# Patient Record
Sex: Male | Born: 1945 | Race: White | Hispanic: No | Marital: Married | Smoking: Former smoker
Health system: Southern US, Community
[De-identification: ages and names within clinical notes are randomized; demographics above are authoritative.]

## PROBLEM LIST (undated history)

## (undated) DIAGNOSIS — M1990 Unspecified osteoarthritis, unspecified site: Secondary | ICD-10-CM

## (undated) DIAGNOSIS — R351 Nocturia: Secondary | ICD-10-CM

## (undated) DIAGNOSIS — R05 Cough: Secondary | ICD-10-CM

## (undated) DIAGNOSIS — K219 Gastro-esophageal reflux disease without esophagitis: Secondary | ICD-10-CM

## (undated) DIAGNOSIS — I10 Essential (primary) hypertension: Principal | ICD-10-CM

## (undated) DIAGNOSIS — R200 Anesthesia of skin: Secondary | ICD-10-CM

## (undated) DIAGNOSIS — I712 Thoracic aortic aneurysm, without rupture: Secondary | ICD-10-CM

## (undated) DIAGNOSIS — H9190 Unspecified hearing loss, unspecified ear: Secondary | ICD-10-CM

## (undated) DIAGNOSIS — E785 Hyperlipidemia, unspecified: Secondary | ICD-10-CM

## (undated) DIAGNOSIS — G4733 Obstructive sleep apnea (adult) (pediatric): Secondary | ICD-10-CM

## (undated) DIAGNOSIS — R053 Chronic cough: Secondary | ICD-10-CM

## (undated) DIAGNOSIS — Z77090 Contact with and (suspected) exposure to asbestos: Secondary | ICD-10-CM

## (undated) DIAGNOSIS — R011 Cardiac murmur, unspecified: Secondary | ICD-10-CM

## (undated) HISTORY — DX: Contact with and (suspected) exposure to asbestos: Z77.090

## (undated) HISTORY — DX: Essential (primary) hypertension: I10

## (undated) HISTORY — DX: Obstructive sleep apnea (adult) (pediatric): G47.33

## (undated) HISTORY — DX: Unspecified hearing loss, unspecified ear: H91.90

## (undated) HISTORY — DX: Thoracic aortic aneurysm, without rupture: I71.2

## (undated) HISTORY — DX: Hyperlipidemia, unspecified: E78.5

## (undated) HISTORY — PX: TONSILLECTOMY: SUR1361

---

## 1981-04-05 HISTORY — PX: HAND SURGERY: SHX662

## 1998-12-31 ENCOUNTER — Ambulatory Visit (HOSPITAL_COMMUNITY): Admission: RE | Admit: 1998-12-31 | Discharge: 1998-12-31 | Payer: Self-pay | Admitting: Gastroenterology

## 2002-10-01 ENCOUNTER — Ambulatory Visit (HOSPITAL_COMMUNITY): Admission: RE | Admit: 2002-10-01 | Discharge: 2002-10-01 | Payer: Self-pay | Admitting: Gastroenterology

## 2002-10-01 ENCOUNTER — Encounter (INDEPENDENT_AMBULATORY_CARE_PROVIDER_SITE_OTHER): Payer: Self-pay | Admitting: Specialist

## 2004-04-16 ENCOUNTER — Ambulatory Visit (HOSPITAL_COMMUNITY): Admission: RE | Admit: 2004-04-16 | Discharge: 2004-04-16 | Payer: Self-pay | Admitting: Gastroenterology

## 2004-04-16 ENCOUNTER — Encounter (INDEPENDENT_AMBULATORY_CARE_PROVIDER_SITE_OTHER): Payer: Self-pay | Admitting: Specialist

## 2007-05-31 ENCOUNTER — Other Ambulatory Visit: Payer: Self-pay | Admitting: General Surgery

## 2007-05-31 ENCOUNTER — Ambulatory Visit (HOSPITAL_COMMUNITY): Admission: RE | Admit: 2007-05-31 | Discharge: 2007-05-31 | Payer: Self-pay | Admitting: General Surgery

## 2007-05-31 ENCOUNTER — Encounter: Admission: RE | Admit: 2007-05-31 | Discharge: 2007-05-31 | Payer: Self-pay | Admitting: General Surgery

## 2007-07-12 ENCOUNTER — Ambulatory Visit (HOSPITAL_BASED_OUTPATIENT_CLINIC_OR_DEPARTMENT_OTHER): Admission: RE | Admit: 2007-07-12 | Discharge: 2007-07-12 | Payer: Self-pay | Admitting: General Surgery

## 2009-04-05 HISTORY — PX: HERNIA REPAIR: SHX51

## 2009-04-05 HISTORY — PX: TOTAL KNEE ARTHROPLASTY: SHX125

## 2009-04-05 HISTORY — PX: CATARACT EXTRACTION: SUR2

## 2009-05-13 ENCOUNTER — Inpatient Hospital Stay (HOSPITAL_COMMUNITY): Admission: RE | Admit: 2009-05-13 | Discharge: 2009-05-16 | Payer: Self-pay | Admitting: Orthopedic Surgery

## 2010-06-24 LAB — URINALYSIS, ROUTINE W REFLEX MICROSCOPIC
Protein, ur: NEGATIVE mg/dL
Urobilinogen, UA: 0.2 mg/dL (ref 0.0–1.0)
pH: 7.5 (ref 5.0–8.0)

## 2010-06-24 LAB — CBC
HCT: 31.4 % — ABNORMAL LOW (ref 39.0–52.0)
HCT: 34.7 % — ABNORMAL LOW (ref 39.0–52.0)
HCT: 40.4 % (ref 39.0–52.0)
MCHC: 34.6 g/dL (ref 30.0–36.0)
MCV: 91.8 fL (ref 78.0–100.0)
MCV: 92 fL (ref 78.0–100.0)
Platelets: 216 10*3/uL (ref 150–400)
Platelets: 278 10*3/uL (ref 150–400)
RBC: 3.74 MIL/uL — ABNORMAL LOW (ref 4.22–5.81)
RDW: 12.9 % (ref 11.5–15.5)
RDW: 13 % (ref 11.5–15.5)
RDW: 13 % (ref 11.5–15.5)
WBC: 10.6 10*3/uL — ABNORMAL HIGH (ref 4.0–10.5)

## 2010-06-24 LAB — PROTIME-INR
INR: 0.86 (ref 0.00–1.49)
INR: 1.1 (ref 0.00–1.49)
INR: 1.7 — ABNORMAL HIGH (ref 0.00–1.49)
Prothrombin Time: 11.6 seconds (ref 11.6–15.2)
Prothrombin Time: 19.8 seconds — ABNORMAL HIGH (ref 11.6–15.2)

## 2010-06-24 LAB — BASIC METABOLIC PANEL
BUN: 10 mg/dL (ref 6–23)
CO2: 27 mEq/L (ref 19–32)
CO2: 28 mEq/L (ref 19–32)
Calcium: 8.1 mg/dL — ABNORMAL LOW (ref 8.4–10.5)
Calcium: 9.1 mg/dL (ref 8.4–10.5)
Chloride: 104 mEq/L (ref 96–112)
Creatinine, Ser: 0.73 mg/dL (ref 0.4–1.5)
Creatinine, Ser: 0.85 mg/dL (ref 0.4–1.5)
GFR calc Af Amer: >60 mL/min (ref >60)
GFR calc non Af Amer: >60 mL/min (ref >60)
Glucose, Bld: 105 mg/dL — ABNORMAL HIGH (ref 70–99)
Glucose, Bld: 134 mg/dL — ABNORMAL HIGH (ref 70–99)
Sodium: 137 mEq/L (ref 135–145)

## 2010-06-24 LAB — URINE CULTURE: Colony Count: 4000

## 2010-06-24 LAB — TYPE AND SCREEN: Antibody Screen: NEGATIVE

## 2010-08-18 NOTE — Op Note (Signed)
NAME:  [name], [name] NO.:  [id number]   MEDICAL RECORD NO.:  [id number]          PATIENT TYPE:  AMB   LOCATION:  [facility]                         FACILITY:  [facility]   PHYSICIAN:  Leonie Man, M.D.   DATE OF BIRTH:  1945/05/06   DATE OF PROCEDURE:  07/12/2007  DATE OF DISCHARGE:                               OPERATIVE REPORT   PREOPERATIVE DIAGNOSIS:  Right inguinal hernia.   POSTOPERATIVE DIAGNOSIS:  Right inguinal hernia.   PROCEDURE:  Right inguinal hernia repair with mesh.   SURGEON:  Leonie Man, M.D.   ASSISTANT:  OR tech.   ANESTHESIA:  General.   SPECIMENS:  Hernia sac (disposed of).   ESTIMATED BLOOD LOSS:  Minimal.   COMPLICATIONS:  None.  The patient was returned to the PACU in excellent  condition.   INDICATION:  Justin Sutton is a 65 year old man with a very large right-  sided inguinal hernia, who comes now to the operating room for repair  after the risks and potential benefits of surgery have been fully  discussed, all questions answered and consent obtained.   PROCEDURE:  Following the induction of satisfactory general anesthesia  with the patient positioned supinely, the right groin was prepped and  draped to be included in a sterile operative field.  Positive  identification of the patient as Justin Sutton and the procedure to be  done as right inguinal hernia was carried out.   I made a transverse incision into the lower abdominal crease, deepening  this through the skin and subcutaneous tissues, carrying the dissection  down to the external oblique aponeurosis.  The external oblique  aponeurosis was opened up through the external inguinal ring with  protection of the ilioinguinal nerve, which was retracted inferiorly and  laterally.  Spermatic cord was elevated and held with a Penrose drain.  Dissection within the spermatic cord revealed a moderately large  indirect hernia sac which was dissected free from the surrounding cord  contents and carried up to the internal ring.  This hernia sac was  devoid of any intra-abdominal contents and was suture-ligated at its  base with 2-0 silk suture.  The redundant sac was amputated and  discarded.  Two relatively large cord lipomas were also dissected free  from the cord, carried up to the internal ring and then clamped and  suture-ligated at the internal ring.  The lipomas were excised and  discarded.  The floor of the inguinal canal was then repaired with a  polypropylene mesh, which was fashioned to fit into the inguinal canal  and split at this tail so as to allow the spermatic cord were come  between the leaflets of the mesh.  The polypropylene mesh was then sewn  in starting at the pubic tubercle with 2-0 Novofil.  The suture line was  carried up along the conjoined tendon to the internal ring and again  from the pubic tubercle up along the shelving edge of Poupart's ligament  to the internal ring.  The tails of the mesh were then sutured down into  the internal oblique muscle behind the cord and  the tails extended up  underneath the external oblique aponeurosis.  All areas of dissection  were then checked for hemostasis and noted to be dry.  Sponge and  instrument counts were verified.  I injected the region of the pubic  tubercle and subcutaneous fat with additional injections of 0.5%  Marcaine with epinephrine.  The external oblique chondrosis was then  closed over the cord with running suture of with 0 Vicryl.  The Scarpa's  fascia and subcutaneous fat were closed with a running suture of 3-0  Vicryl, skin was closed with running suture of 4-0 Monocryl and then  reinforced with Dermabond and Steri-Strips.  Sterile dressing was  applied.  Anesthetic reversed.  The patient was removed from the  operating room to the recovery room in stable condition.  He tolerated  the procedure well.      Leonie Man, M.D.  Electronically Signed     PB/MEDQ  D:   07/12/2007  T:  07/12/2007  Job:  [id number]   cc:   Justin Sutton, M.D.  Fax: [phone]

## 2010-08-21 NOTE — Op Note (Signed)
NAME:  Justin Sutton, Justin Sutton NO.:  [id number]   MEDICAL RECORD NO.:  [id number]          PATIENT TYPE:  AMB   LOCATION:  ENDO                         FACILITY:  [facility]   PHYSICIAN:  Petra Kuba, M.D.    DATE OF BIRTH:  Jun 29, 1945   DATE OF PROCEDURE:  04/16/2004  DATE OF DISCHARGE:                                 OPERATIVE REPORT   PROCEDURE:  Colonoscopy with polypectomy.   INDICATION:  Patient with a history of colon polyps due for repeat  screening.   Consent was signed after the risks, benefits, methods, and options were  thoroughly discussed in the office on multiple occasions.   MEDICATIONS:  Demerol 100 mg, Versed 10 mg.   DESCRIPTION OF PROCEDURE:  Rectal inspection was pertinent for external  hemorrhoids, small.  Digital exam was negative.  The video colonoscope was  inserted and with abdominal pressure easily able to be advanced around the  colon to the cecum.  On insertion, left-sided diverticula were seen but no  other abnormalities.  The cecum was identified by the appendiceal orifice  and the ileocecal valve.  The scope was inserted a short distance in the  terminal ileum which was normal, photographic documentation was obtained and  the scope was slowly withdrawn.  The prep was adequate after about a liter  of washing and suctioning of liquid stool.  On slow withdrawal through the  colon, the cecum and the ascending were normal.  In the hepatic flexure  there was a questionable tiny polyp seen, it was hot biopsied x1 and put in  the first container and the scope slowly withdrawn.  The left-sided  diverticula were confirmed.  Also, on the left side three tiny polyps were  seen and were hot biopsied.  In the palpable descending, sigmoid and rectum  and also behind the sigmoid-rectal junction fold was a small pedunculated  polyp which was snared, electrocautery applied and the polyp was suctioned  through the scope and collected in the trap.  After  removal there was a nice  quite coagulum without any residual polypoid tissue seen.  Anorectal pull-  through on retroflexion confirmed some small hemorrhoids.  The scope was  straightened and readvanced shortly to the left side of the colon, air was  suctioned and the scope removed.  The patient tolerated the procedure well  and there was no obvious immediate complication.   ENDOSCOPIC DIAGNOSES:  1.  Internal and external small hemorrhoids.  2.  Left-sided moderate diverticula.  3.  Three tiny rectal sigmoid and possibly descending polyps, biopsied.  4.  One small rectosigmoid junction polyp, snared.  5.  Tiny hepatic flexure polyp, hot biopsied.  6.  Otherwise within normal limits to the terminal ileum.   PLAN:  Await pathology to determine future colonic screening, happy to see  him back p.r.n., otherwise we will ask him to go back to Dr. Earlene Plater for blood  pressure check in a few weeks and also recommend yearly physical, including  yearly rectal, guaiacs, PSA's, etc. by Dr. Earlene Plater.       MEM/MEDQ  D:  04/16/2004  T:  04/16/2004  Job:  [id number]   cc:   Louanna Raw, M.D.  [facility]

## 2010-08-21 NOTE — Op Note (Signed)
NAME:  Justin Sutton, Justin Sutton NO.:  [id number]   MEDICAL RECORD NO.:  [id number]                   PATIENT TYPE:  AMB   LOCATION:  ENDO                                 [facility]ACILITY[facility]   PHYSICIAN:  Petra Kuba, M.D.                 DATE OF BIRTH:  09-16-1945   DATE OF PROCEDURE:  10/01/2002  DATE OF DISCHARGE:                                 OPERATIVE REPORT   PROCEDURE:  EGD with biopsy.   INDICATION:  Problems with nausea, vomiting, questionably due to ulcers,  questionably from chronic nonsteroidal use.  Weight loss.  Consent was  signed after risks, benefits, methods, options thoroughly discussed in the  office.   MEDICINES USED:  1. Demerol 80.  2. Versed 8.   DESCRIPTION OF PROCEDURE:  Video endoscope was inserted by direct vision.  The proximal and mid esophagus was normal.  The distal esophagus with some  spasm.  We were easily able to advance the scope in the stomach.  This was  probably spasm and not achalasia, although it could have had that  appearance.  The scope was inserted through a normal pylorus, into a normal  duodenal bulb, and around the C-loop to a normal second portion of the  duodenum.  We also advanced further, possibly to a third and fourth part of  the duodenum.  The scope was slowly withdrawn back to the bulb.  No duodenal  abnormality was seen.  The scope was withdrawn back to the stomach and  retroflexed.  High in the cardia, a tiny hiatal hernia was confirmed.  Angularis, lesser and greater curve and fundus were all normal on  retroflexed visualization.  Straight visualization of the antrum and the  stomach revealed some minimal antritis and gastritis but ruled out any  additional findings.  A few biopsies of the antrum, a few of the proximal  stomach were obtained to confirm the gastritis, rule out Helicobacter.  Air  was suctioned, scope slowly withdrawn.  Again, a good look at the esophagus  confirmed the above  findings.  There were no abnormalities.  The scope was  removed.  The patient tolerated the procedure well.  There was no obvious  immediate complication.   ENDOSCOPIC DIAGNOSES:  1. Tiny hiatal hernia.  2. Distal esophageal spasm, doubt achalasia.  3. Minimal antritis/gastritis, status post biopsy.  4. Otherwise normal EGD to the third or fourth part of the duodenum.   PLAN:  1. Continue Prevacid since that is possibly helping.  2. At the first signs of swallowing difficulty or symptoms continuing, might     consider a barium swallow to again rule out achalasia and maybe even a     manometry, depending on his other symptoms or continued work-up.  Would     consider other work-up and plans like small-bowel follow-through or a CAT     scan.  In the  meantime, await pathology to make sure his Helicobacter in     the past was adequately treated.  3.     Be happy so see back p.r.n. or in 2-3 months to recheck symptoms and make     sure no further work-up plans are needed.  In the meantime, try to get     Dr. Earlene Plater' labs and x-rays to bring our chart up to date and make sure no     further work-up plans need to be done from that standpoint.                                               Petra Kuba, M.D.    MEM/MEDQ  D:  10/01/2002  T:  10/01/2002  Job:  [id number]   cc:   Louanna Raw  [address]  [place]  [place] [address]  Fax: [phone]

## 2010-12-25 LAB — CBC
HCT: 42.1
Hemoglobin: 14.7
RBC: 4.66
WBC: 9.1

## 2010-12-25 LAB — DIFFERENTIAL
Basophils Absolute: 0
Lymphs Abs: 1.8
Monocytes Relative: 5
Neutro Abs: 6.5
Neutrophils Relative %: 72

## 2010-12-25 LAB — COMPREHENSIVE METABOLIC PANEL
CO2: 25
Calcium: 9.3
Chloride: 103
Creatinine, Ser: 0.86
Glucose, Bld: 129 — ABNORMAL HIGH
Potassium: 3.6

## 2010-12-29 LAB — COMPREHENSIVE METABOLIC PANEL
ALT: 18
AST: 18
Albumin: 4.3
Alkaline Phosphatase: 72
Calcium: 9.7
GFR calc non Af Amer: >60
Glucose, Bld: 124 — ABNORMAL HIGH
Sodium: 140
Total Protein: 6.7

## 2010-12-29 LAB — CBC
HCT: 44.7
Hemoglobin: 15.2
MCV: 90.6
RBC: 4.94

## 2010-12-29 LAB — DIFFERENTIAL
Basophils Relative: 1
Eosinophils Absolute: 0.4
Eosinophils Relative: 5
Monocytes Relative: 7
Neutro Abs: 5.5

## 2011-03-09 ENCOUNTER — Other Ambulatory Visit: Payer: Self-pay | Admitting: Family Medicine

## 2011-03-09 DIAGNOSIS — Z136 Encounter for screening for cardiovascular disorders: Secondary | ICD-10-CM

## 2011-03-16 ENCOUNTER — Ambulatory Visit
Admission: RE | Admit: 2011-03-16 | Discharge: 2011-03-16 | Disposition: A | Discharge status: Home / Self Care | Payer: Medicare Other | Source: Ambulatory Visit | Attending: Family Medicine | Admitting: Family Medicine

## 2011-03-16 DIAGNOSIS — Z136 Encounter for screening for cardiovascular disorders: Secondary | ICD-10-CM

## 2011-07-21 DIAGNOSIS — Z961 Presence of intraocular lens: Secondary | ICD-10-CM | POA: Diagnosis not present

## 2011-09-14 DIAGNOSIS — I10 Essential (primary) hypertension: Secondary | ICD-10-CM | POA: Diagnosis not present

## 2011-09-14 DIAGNOSIS — E7800 Pure hypercholesterolemia, unspecified: Secondary | ICD-10-CM | POA: Diagnosis not present

## 2011-11-25 DIAGNOSIS — M1710 Unilateral primary osteoarthritis, unspecified knee: Secondary | ICD-10-CM | POA: Diagnosis not present

## 2011-11-25 DIAGNOSIS — M25569 Pain in unspecified knee: Secondary | ICD-10-CM | POA: Diagnosis not present

## 2012-01-28 DIAGNOSIS — R040 Epistaxis: Secondary | ICD-10-CM | POA: Diagnosis not present

## 2012-01-28 DIAGNOSIS — I10 Essential (primary) hypertension: Secondary | ICD-10-CM | POA: Diagnosis not present

## 2012-01-28 DIAGNOSIS — Z23 Encounter for immunization: Secondary | ICD-10-CM | POA: Diagnosis not present

## 2012-02-02 DIAGNOSIS — H9190 Unspecified hearing loss, unspecified ear: Secondary | ICD-10-CM | POA: Diagnosis not present

## 2012-02-02 DIAGNOSIS — R040 Epistaxis: Secondary | ICD-10-CM | POA: Diagnosis not present

## 2012-03-10 ENCOUNTER — Ambulatory Visit
Admission: RE | Admit: 2012-03-10 | Discharge: 2012-03-10 | Disposition: A | Discharge status: Home / Self Care | Payer: Medicare Other | Source: Ambulatory Visit | Attending: Family Medicine | Admitting: Family Medicine

## 2012-03-10 ENCOUNTER — Other Ambulatory Visit: Payer: Self-pay | Admitting: Family Medicine

## 2012-03-10 DIAGNOSIS — L723 Sebaceous cyst: Secondary | ICD-10-CM | POA: Diagnosis not present

## 2012-03-10 DIAGNOSIS — Z0000 Encounter for general adult medical examination without abnormal findings: Secondary | ICD-10-CM | POA: Diagnosis not present

## 2012-03-10 DIAGNOSIS — E7800 Pure hypercholesterolemia, unspecified: Secondary | ICD-10-CM | POA: Diagnosis not present

## 2012-03-10 DIAGNOSIS — Z23 Encounter for immunization: Secondary | ICD-10-CM | POA: Diagnosis not present

## 2012-03-10 DIAGNOSIS — R05 Cough: Secondary | ICD-10-CM

## 2012-03-10 DIAGNOSIS — J449 Chronic obstructive pulmonary disease, unspecified: Secondary | ICD-10-CM | POA: Diagnosis not present

## 2012-03-10 DIAGNOSIS — Z125 Encounter for screening for malignant neoplasm of prostate: Secondary | ICD-10-CM | POA: Diagnosis not present

## 2012-03-10 DIAGNOSIS — I10 Essential (primary) hypertension: Secondary | ICD-10-CM | POA: Diagnosis not present

## 2012-04-06 ENCOUNTER — Other Ambulatory Visit: Payer: Self-pay | Admitting: Gastroenterology

## 2012-04-06 DIAGNOSIS — K5730 Diverticulosis of large intestine without perforation or abscess without bleeding: Secondary | ICD-10-CM | POA: Diagnosis not present

## 2012-04-06 DIAGNOSIS — K620 Anal polyp: Secondary | ICD-10-CM | POA: Diagnosis not present

## 2012-04-06 DIAGNOSIS — Z09 Encounter for follow-up examination after completed treatment for conditions other than malignant neoplasm: Secondary | ICD-10-CM | POA: Diagnosis not present

## 2012-04-06 DIAGNOSIS — Z86010 Personal history of colonic polyps: Secondary | ICD-10-CM | POA: Diagnosis not present

## 2012-04-06 DIAGNOSIS — D128 Benign neoplasm of rectum: Secondary | ICD-10-CM | POA: Diagnosis not present

## 2012-04-06 DIAGNOSIS — D129 Benign neoplasm of anus and anal canal: Secondary | ICD-10-CM | POA: Diagnosis not present

## 2012-05-09 DIAGNOSIS — M25569 Pain in unspecified knee: Secondary | ICD-10-CM | POA: Diagnosis not present

## 2012-05-09 DIAGNOSIS — M23329 Other meniscus derangements, posterior horn of medial meniscus, unspecified knee: Secondary | ICD-10-CM | POA: Diagnosis not present

## 2012-05-09 DIAGNOSIS — M1710 Unilateral primary osteoarthritis, unspecified knee: Secondary | ICD-10-CM | POA: Diagnosis not present

## 2012-09-08 DIAGNOSIS — R05 Cough: Secondary | ICD-10-CM | POA: Diagnosis not present

## 2012-09-08 DIAGNOSIS — E7800 Pure hypercholesterolemia, unspecified: Secondary | ICD-10-CM | POA: Diagnosis not present

## 2012-09-08 DIAGNOSIS — I10 Essential (primary) hypertension: Secondary | ICD-10-CM | POA: Diagnosis not present

## 2012-09-12 ENCOUNTER — Ambulatory Visit (INDEPENDENT_AMBULATORY_CARE_PROVIDER_SITE_OTHER): Payer: Medicare Other | Admitting: Internal Medicine

## 2012-09-12 ENCOUNTER — Encounter: Payer: Self-pay | Admitting: Internal Medicine

## 2012-09-12 VITALS — BP 120/86 | HR 70 | Temp 97.4°F | Ht 70.0 in | Wt 204.8 lb

## 2012-09-12 DIAGNOSIS — Z77090 Contact with and (suspected) exposure to asbestos: Secondary | ICD-10-CM | POA: Diagnosis not present

## 2012-09-12 DIAGNOSIS — R05 Cough: Secondary | ICD-10-CM | POA: Diagnosis not present

## 2012-09-12 NOTE — Assessment & Plan Note (Addendum)
The most common causes of chronic cough in immunocompetent adults include the following: upper airway cough syndrome (UACS), previously referred to as postnasal drip syndrome (PNDS), which is caused by variety of rhinosinus conditions; (2) asthma; (3) GERD; (4) chronic bronchitis from cigarette smoking or other inhaled environmental irritants; (5) nonasthmatic eosinophilic bronchitis; and (6) bronchiectasis.   These conditions, singly or in combination, have accounted for up to 94% of the causes of chronic cough in prospective studies.   Other conditions have constituted no >6% of the causes in prospective studies These have included bronchogenic carcinoma, chronic interstitial pneumonia, sarcoidosis, left ventricular failure, ACEI-induced cough, and aspiration from a condition associated with pharyngeal dysfunction.    Chronic cough is often simultaneously caused by more than one condition. A single cause has been found from 38 to 82% of the time, multiple causes from 18 to 62%. Multiply caused cough has been the result of three diseases up to 42% of the time.   Most likely this is  Classic Upper airway cough syndrome, so named because it's frequently impossible to sort out how much is  CR/sinusitis with freq throat clearing (which can be related to primary GERD)   vs  causing  secondary (" extra esophageal")  GERD from wide swings in gastric pressure that occur with throat clearing, often  promoting self use of mint and menthol lozenges that reduce the lower esophageal sphincter tone and exacerbate the problem further in a cyclical fashion.   These are the same pts (now being labeled as having "irritable larynx syndrome" by some cough centers) who not infrequently have a history of having failed to tolerate ace inhibitors,  dry powder inhalers or biphosphonates or report having atypical reflux symptoms that don't respond to standard doses of PPI , and are easily confused as having aecopd or asthma flares  by even experienced allergists/ pulmonologists.  For now max rx for gerd then regroup

## 2012-09-12 NOTE — Patient Instructions (Signed)
GERD (REFLUX)  is an extremely common cause of respiratory symptoms, many times with no significant heartburn at all.    It can be treated with medication, but also with lifestyle changes including avoidance of late meals, excessive alcohol, smoking cessation, and avoid fatty foods, chocolate, peppermint, colas, red wine, and acidic juices such as orange juice.  NO MINT OR MENTHOL PRODUCTS SO NO COUGH DROPS  USE SUGARLESS CANDY INSTEAD (jolley ranchers or Stover's)  NO OIL BASED VITAMINS - use powdered substitutes.    Pantoprazole (protonix) 40 mg   Take 30-60 min before first meal of the day and Zantac 150 one bedtime until return to office - this is the best way to tell whether stomach acid is contributing to your problem.    Please schedule a follow up office visit in 6 weeks, call sooner if needed pft's

## 2012-09-12 NOTE — Progress Notes (Signed)
  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  No obvious daytime variabilty or assoc execess mucus production or cp or chest tightness, subjective wheeze overt sinus or hb symptoms. No unusual exp hx or h/o childhood pna/ asthma or knowledge of premature birth.   Sleeping ok without nocturnal  or early am exacerbation  of respiratory  c/o's or need for noct saba. Also denies any obvious fluctuation of symptoms with weather or environmental changes or other aggravating or alleviating factors except as outlined above        Review of Systems  Constitutional: Negative for fever, chills, activity change, appetite change and unexpected weight change.  HENT: Negative for congestion, sore throat, rhinorrhea, sneezing, trouble swallowing, dental problem, voice change and postnasal drip.   Eyes: Negative for visual disturbance.  Respiratory: Positive for cough. Negative for choking and shortness of breath.   Cardiovascular: Negative for chest pain and leg swelling.  Gastrointestinal: Negative for nausea, vomiting and abdominal pain.  Genitourinary: Negative for difficulty urinating.  Musculoskeletal: Negative for arthralgias.  Skin: Negative for rash.  Psychiatric/Behavioral: Negative for behavioral problems and confusion.       Objective:   Physical Exam  Wt Readings from Last 3 Encounters:  09/12/12 204 lb 12.8 oz (92.897 kg)     HEENT: full dentures, nl turbinates, and orophanx. Nl external ear canals without cough reflex   NECK :  without JVD/Nodes/TM/ nl  carotid upstrokes bilaterally   LUNGS: no acc muscle use, clear to A and P bilaterally without cough on insp or exp maneuvers   CV:  RRR  no s3 or murmur or increase in P2, no edema   ABD:  soft and nontender with nl excursion in the supine position. No bruits or organomegaly, bowel sounds nl  MS:  warm without deformities, calf tenderness, cyanosis or clubbing  SKIN: warm and dry without lesions    NEURO:  alert, approp, no deficits    cxr 03/10/12 Copd but no active dz, no asbestos      Assessment & Plan:

## 2012-09-13 ENCOUNTER — Telehealth: Payer: Self-pay | Admitting: Internal Medicine

## 2012-09-13 MED ORDER — PANTOPRAZOLE SODIUM 40 MG PO TBEC: DELAYED_RELEASE_TABLET | ORAL | Status: DC

## 2012-09-13 NOTE — Telephone Encounter (Signed)
Spoke with pt ,protonix wasn't sent at [facility]. rx sent nothing further needed.

## 2012-09-14 DIAGNOSIS — Z77090 Contact with and (suspected) exposure to asbestos: Secondary | ICD-10-CM | POA: Insufficient documentation

## 2012-09-14 NOTE — Assessment & Plan Note (Signed)
cxr shows no evidence of asbestos related pleural plaques or PF now 40 years afer exposure so all I rec at this point is yearly cxr  No evidence at all that asbestosis is causing his cough as not occurring on insp or with exertion

## 2012-10-30 ENCOUNTER — Encounter: Payer: Self-pay | Admitting: Internal Medicine

## 2012-10-30 ENCOUNTER — Other Ambulatory Visit (INDEPENDENT_AMBULATORY_CARE_PROVIDER_SITE_OTHER): Payer: Medicare Other

## 2012-10-30 ENCOUNTER — Ambulatory Visit (INDEPENDENT_AMBULATORY_CARE_PROVIDER_SITE_OTHER): Payer: Medicare Other | Admitting: Internal Medicine

## 2012-10-30 VITALS — BP 124/90 | HR 85 | Temp 97.6°F | Ht 69.5 in | Wt 209.0 lb

## 2012-10-30 DIAGNOSIS — R05 Cough: Secondary | ICD-10-CM

## 2012-10-30 DIAGNOSIS — R059 Cough, unspecified: Secondary | ICD-10-CM

## 2012-10-30 DIAGNOSIS — Z77090 Contact with and (suspected) exposure to asbestos: Secondary | ICD-10-CM | POA: Diagnosis not present

## 2012-10-30 LAB — CBC WITH DIFFERENTIAL/PLATELET
Basophils Relative: 0.5 % (ref 0.0–3.0)
Eosinophils Relative: 8.2 % — ABNORMAL HIGH (ref 0.0–5.0)
Hemoglobin: 15.5 g/dL (ref 13.0–17.0)
Lymphocytes Relative: 20.4 % (ref 12.0–46.0)
MCHC: 34.1 g/dL (ref 30.0–36.0)
Monocytes Relative: 8.1 % (ref 3.0–12.0)
Neutro Abs: 5.8 10*3/uL (ref 1.4–7.7)
Neutrophils Relative %: 62.8 % (ref 43.0–77.0)
RBC: 5 Mil/uL (ref 4.22–5.81)
WBC: 9.2 10*3/uL (ref 4.5–10.5)

## 2012-10-30 LAB — ALLERGY PROFILE REGION II-DC, DE, MD, ~~LOC~~, VA
Bermuda Grass: <0.1 kU/L
Cat Dander: <0.1 kU/L
D. farinae: <0.1 kU/L
Dog Dander: <0.1 kU/L
Elm IgE: <0.1 kU/L
IgE (Immunoglobulin E), Serum: 20.2 IU/mL (ref 0.0–180.0)
Johnson Grass: <0.1 kU/L
Lamb's Quarters: <0.1 kU/L
Meadow Grass: <0.1 kU/L
Oak: <0.1 kU/L
Pecan/Hickory Tree IgE: <0.1 kU/L

## 2012-10-30 LAB — PULMONARY FUNCTION TEST

## 2012-10-30 MED ORDER — RANITIDINE HCL 150 MG PO CAPS: ORAL_CAPSULE | ORAL | Status: DC

## 2012-10-30 NOTE — Progress Notes (Signed)
PFT done today. 

## 2012-10-30 NOTE — Progress Notes (Signed)
  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 rec GERD diet   Pantoprazole (protonix) 40 mg   Take 30-60 min before first meal of the day and Zantac 150 one bedtime until return to office -   10/30/2012 f/u ov/Justin Sutton re cough and ? asbetosis lung dz Chief Complaint  Patient presents with  . Followup with PFT    Pt states cough is the same, no better or worse since the last visit. No new co's today.   cough right before supper, no change at all with ppi, present for 10 years  No obvious daytime variabilty or assoc sob,  execess mucus production or cp or chest tightness, subjective wheeze overt sinus or hb symptoms. No unusual exp hx or h/o childhood pna/ asthma or knowledge of premature birth.   Sleeping ok without nocturnal  or early am exacerbation  of respiratory  c/o's or need for noct saba. Also denies any obvious fluctuation of symptoms with weather or environmental changes or other aggravating or alleviating factors except as outlined above.  Current Medications, Allergies, Past Medical History, Past Surgical History, Family History, and Social History were reviewed in [employer] record.  ROS  The following are not active complaints unless bolded sore throat, dysphagia, dental problems, itching, sneezing,  nasal congestion or excess/ purulent secretions, ear ache,   fever, chills, sweats, unintended wt loss, pleuritic or exertional cp, hemoptysis,  orthopnea pnd or leg swelling, presyncope, palpitations, heartburn, abdominal  pain, anorexia, nausea, vomiting, diarrhea  or change in bowel or urinary habits, change in stools or urine, dysuria,hematuria,  rash, arthralgias, visual complaints, headache, numbness weakness or ataxia or problems with walking or coordination,  change in mood/affect or memory.                Objective:   Physical Exam  10/30/2012       209  Wt Readings from Last 3 Encounters:  09/12/12 204 lb 12.8 oz (92.897 kg)     HEENT: full dentures, nl turbinates, and orophanx. Nl external ear canals without cough reflex   NECK :  without JVD/Nodes/TM/ nl carotid upstrokes bilaterally   LUNGS: no acc muscle use, clear to A and P bilaterally without cough on insp or exp maneuvers   CV:  RRR  no s3 or murmur or increase in P2, no edema   ABD:  soft and nontender with nl excursion in the supine position. No bruits or organomegaly, bowel sounds nl  MS:  warm without deformities, calf tenderness, cyanosis or clubbing         cxr 03/10/12 Copd but no active dz, no asbestos      Assessment & Plan:

## 2012-10-30 NOTE — Assessment & Plan Note (Signed)
No better with acid suppression.  Lack of cough resolution could mean an alternative diagnosis (allergy/ sinusitis) , persistence of the disease state(asbestosis, bronchiectasis), or inadequacy of currently available therapy (eg no rx available for non-acid gerd) .    Therefore stop acid suppression at this point and explore alternative dx and persistence of dz state with allergy profile, ct sinus and chest respectively.  See instructions for specific recommendations which were reviewed directly with the patient who was given a copy with highlighter outlining the key components.

## 2012-10-30 NOTE — Patient Instructions (Addendum)
Please see patient coordinator before you leave today  to schedule ct chest and sinus  Stop protonix and just take the zantac as needed for heartburn  Please remember to go to the lab   department downstairs for your tests - we will call you with the results when they are available.

## 2012-11-06 ENCOUNTER — Other Ambulatory Visit: Payer: Self-pay | Admitting: Internal Medicine

## 2012-11-06 ENCOUNTER — Telehealth: Payer: Self-pay | Admitting: Internal Medicine

## 2012-11-06 ENCOUNTER — Other Ambulatory Visit (INDEPENDENT_AMBULATORY_CARE_PROVIDER_SITE_OTHER): Payer: Medicare Other

## 2012-11-06 DIAGNOSIS — R05 Cough: Secondary | ICD-10-CM

## 2012-11-06 NOTE — Telephone Encounter (Signed)
lmtcb x1 

## 2012-11-06 NOTE — Telephone Encounter (Signed)
I spoke with patient about results and he verbalized understanding and had no questions 

## 2012-11-07 ENCOUNTER — Other Ambulatory Visit: Payer: Self-pay | Admitting: Internal Medicine

## 2012-11-07 ENCOUNTER — Ambulatory Visit (INDEPENDENT_AMBULATORY_CARE_PROVIDER_SITE_OTHER)
Admission: RE | Admit: 2012-11-07 | Discharge: 2012-11-07 | Disposition: A | Discharge status: Home / Self Care | Payer: Medicare Other | Source: Ambulatory Visit | Attending: Internal Medicine | Admitting: Internal Medicine

## 2012-11-07 ENCOUNTER — Encounter: Payer: Self-pay | Admitting: Internal Medicine

## 2012-11-07 DIAGNOSIS — R05 Cough: Secondary | ICD-10-CM

## 2012-11-07 DIAGNOSIS — M2749 Other cysts of jaw: Secondary | ICD-10-CM | POA: Diagnosis not present

## 2012-11-07 DIAGNOSIS — J984 Other disorders of lung: Secondary | ICD-10-CM | POA: Diagnosis not present

## 2012-11-07 MED ORDER — IOHEXOL 300 MG/ML  SOLN
80.0000 mL | Freq: Once | INTRAMUSCULAR | Status: AC | PRN
  Administered 2012-11-07: 80 mL via INTRAVENOUS

## 2012-11-08 ENCOUNTER — Other Ambulatory Visit: Payer: Self-pay

## 2012-11-09 NOTE — Progress Notes (Signed)
Quick Note:  Spoke with pt and notified of results per Dr. Wert. Pt verbalized understanding and denied any questions.  ______ 

## 2012-12-08 DIAGNOSIS — M1710 Unilateral primary osteoarthritis, unspecified knee: Secondary | ICD-10-CM | POA: Diagnosis not present

## 2012-12-21 DIAGNOSIS — M1710 Unilateral primary osteoarthritis, unspecified knee: Secondary | ICD-10-CM | POA: Diagnosis not present

## 2013-02-08 ENCOUNTER — Other Ambulatory Visit: Payer: Self-pay

## 2013-04-23 DIAGNOSIS — B009 Herpesviral infection, unspecified: Secondary | ICD-10-CM | POA: Diagnosis not present

## 2013-04-23 DIAGNOSIS — I10 Essential (primary) hypertension: Secondary | ICD-10-CM | POA: Diagnosis not present

## 2013-04-23 DIAGNOSIS — M25569 Pain in unspecified knee: Secondary | ICD-10-CM | POA: Diagnosis not present

## 2013-04-23 DIAGNOSIS — T148XXA Other injury of unspecified body region, initial encounter: Secondary | ICD-10-CM | POA: Diagnosis not present

## 2013-04-23 DIAGNOSIS — E7800 Pure hypercholesterolemia, unspecified: Secondary | ICD-10-CM | POA: Diagnosis not present

## 2013-04-23 DIAGNOSIS — R05 Cough: Secondary | ICD-10-CM | POA: Diagnosis not present

## 2013-04-23 DIAGNOSIS — R059 Cough, unspecified: Secondary | ICD-10-CM | POA: Diagnosis not present

## 2013-04-23 DIAGNOSIS — Z125 Encounter for screening for malignant neoplasm of prostate: Secondary | ICD-10-CM | POA: Diagnosis not present

## 2013-04-23 DIAGNOSIS — K219 Gastro-esophageal reflux disease without esophagitis: Secondary | ICD-10-CM | POA: Diagnosis not present

## 2013-04-23 DIAGNOSIS — Z0000 Encounter for general adult medical examination without abnormal findings: Secondary | ICD-10-CM | POA: Diagnosis not present

## 2013-10-24 DIAGNOSIS — R7301 Impaired fasting glucose: Secondary | ICD-10-CM | POA: Diagnosis not present

## 2014-01-18 ENCOUNTER — Other Ambulatory Visit: Payer: Self-pay

## 2014-04-24 DIAGNOSIS — Z125 Encounter for screening for malignant neoplasm of prostate: Secondary | ICD-10-CM | POA: Diagnosis not present

## 2014-04-24 DIAGNOSIS — Z79899 Other long term (current) drug therapy: Secondary | ICD-10-CM | POA: Diagnosis not present

## 2014-04-24 DIAGNOSIS — Z0001 Encounter for general adult medical examination with abnormal findings: Secondary | ICD-10-CM | POA: Diagnosis not present

## 2014-04-24 DIAGNOSIS — Z23 Encounter for immunization: Secondary | ICD-10-CM | POA: Diagnosis not present

## 2014-04-24 DIAGNOSIS — I739 Peripheral vascular disease, unspecified: Secondary | ICD-10-CM | POA: Diagnosis not present

## 2014-04-24 DIAGNOSIS — I10 Essential (primary) hypertension: Secondary | ICD-10-CM | POA: Diagnosis not present

## 2014-04-24 DIAGNOSIS — E780 Pure hypercholesterolemia: Secondary | ICD-10-CM | POA: Diagnosis not present

## 2014-04-24 DIAGNOSIS — M179 Osteoarthritis of knee, unspecified: Secondary | ICD-10-CM | POA: Diagnosis not present

## 2014-05-14 DIAGNOSIS — R7301 Impaired fasting glucose: Secondary | ICD-10-CM | POA: Diagnosis not present

## 2014-07-31 IMAGING — CR DG CHEST 2V
2 series · 2 of 2 positions shown · non-contrast
Comparison: Chest x-ray of 05/08/2009

CLINICAL DATA: Cough, former smoking history

CHEST - 2 VIEW

[view not recorded (1 of 2)]
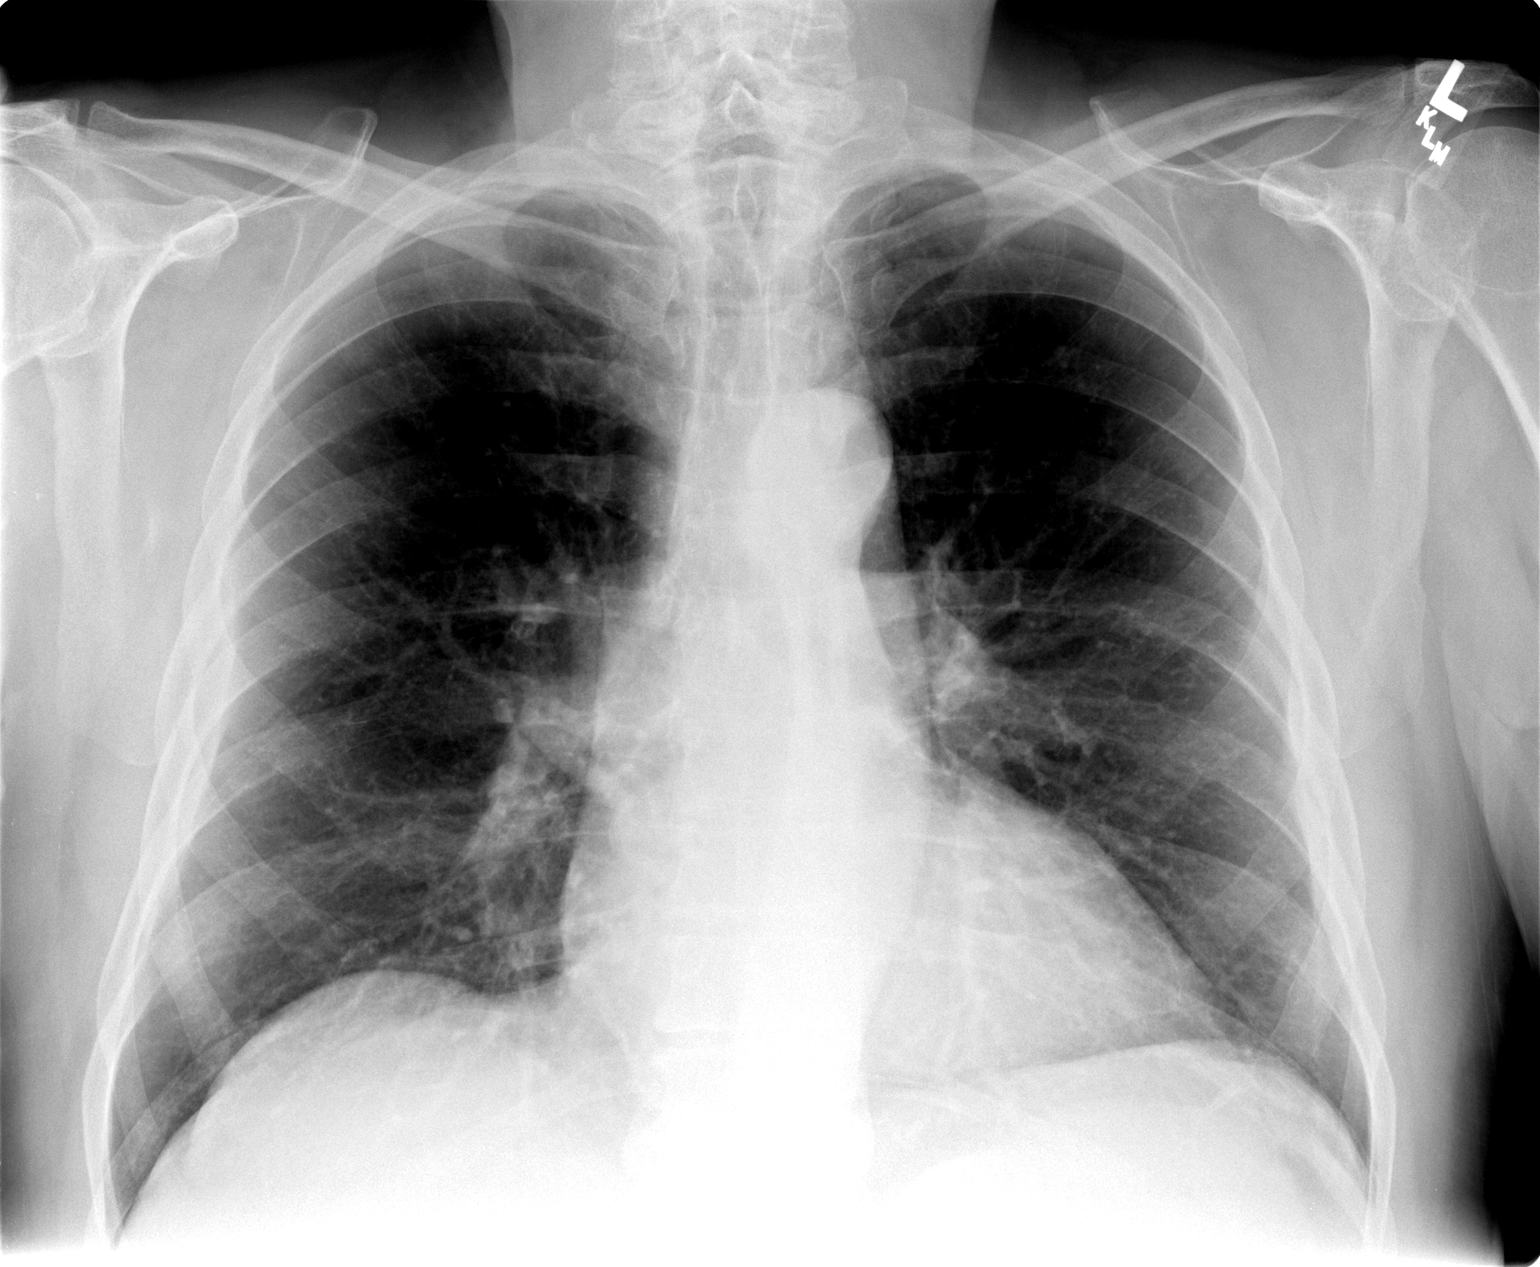

[view not recorded (2 of 2)]
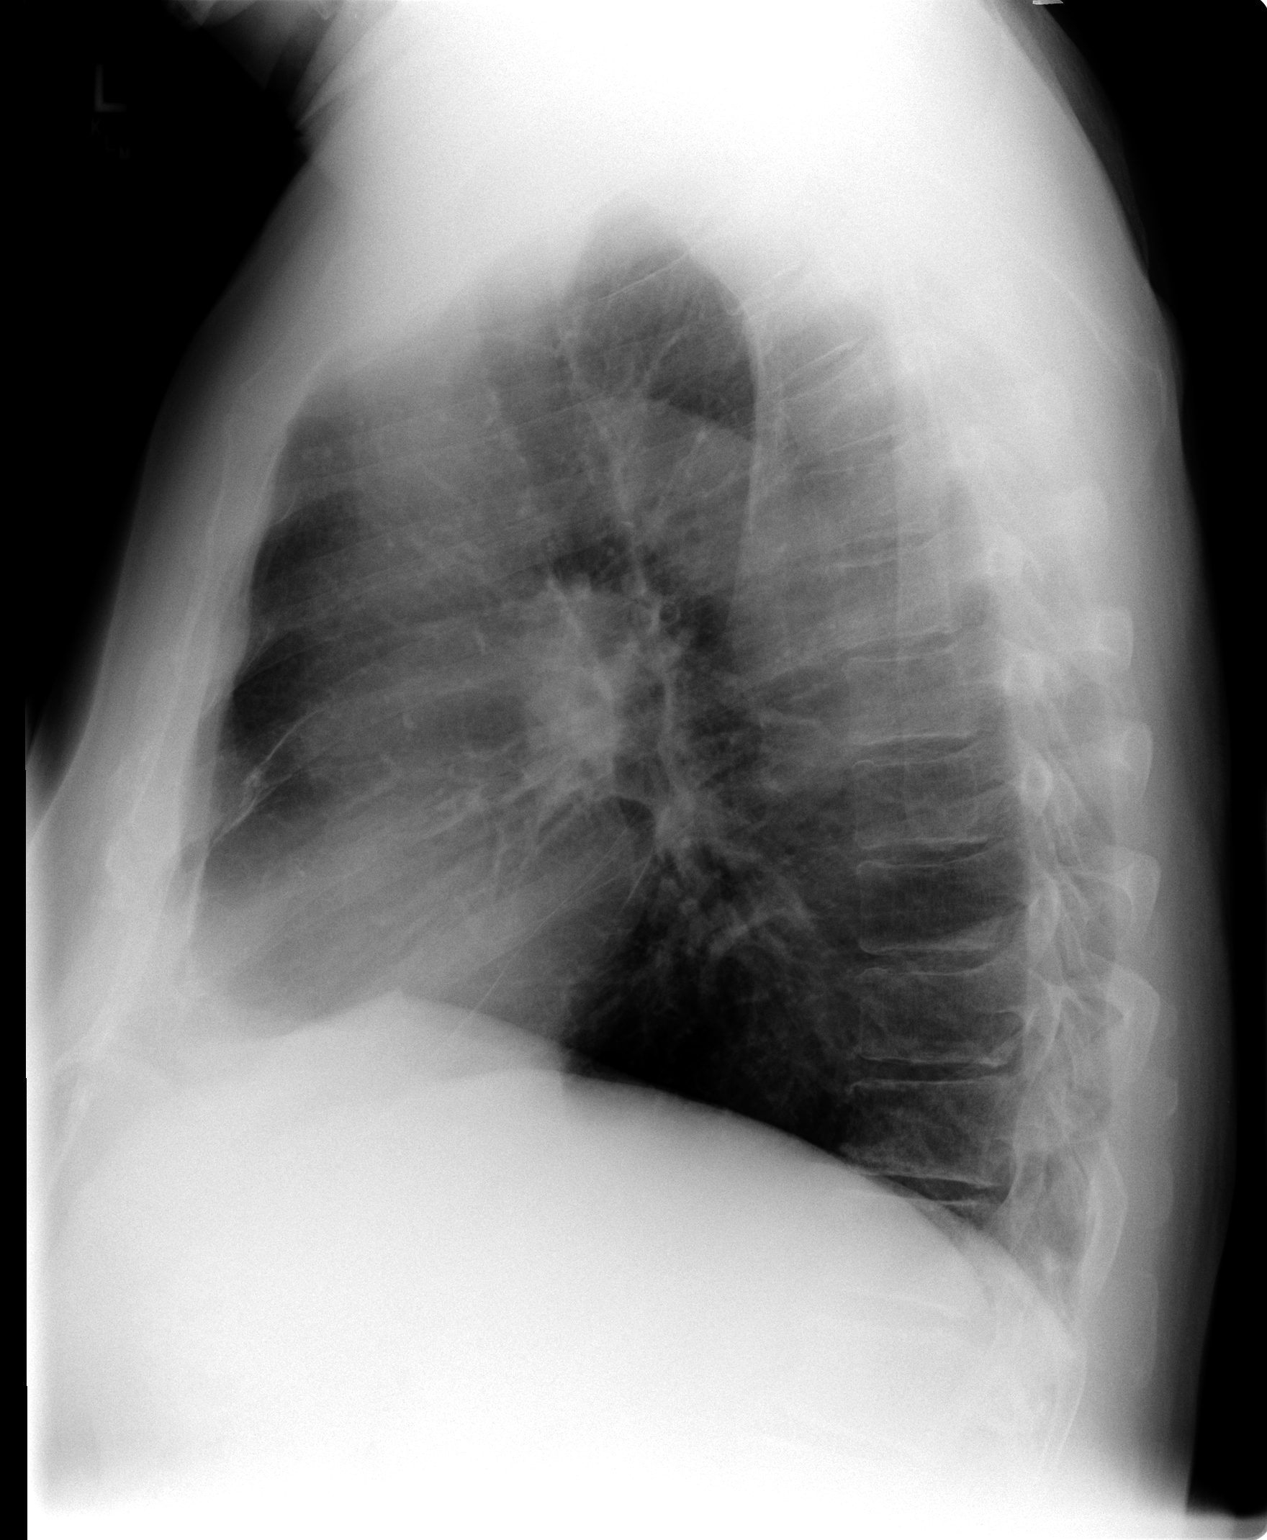

[2 of 2 positions shown; findings below may reference images not displayed]

FINDINGS: The lungs remain hyperaerated consistent with COPD.
Minimal peribronchial thickening remains.  Cardiomegaly is stable.
No bony abnormality is seen.
IMPRESSION: COPD.  Stable cardiomegaly.  No active lung disease.

## 2014-09-30 ENCOUNTER — Other Ambulatory Visit: Payer: Self-pay

## 2014-12-02 DIAGNOSIS — K529 Noninfective gastroenteritis and colitis, unspecified: Secondary | ICD-10-CM | POA: Diagnosis not present

## 2014-12-02 DIAGNOSIS — I10 Essential (primary) hypertension: Secondary | ICD-10-CM | POA: Diagnosis not present

## 2015-01-01 DIAGNOSIS — Z96652 Presence of left artificial knee joint: Secondary | ICD-10-CM | POA: Diagnosis not present

## 2015-01-01 DIAGNOSIS — M1711 Unilateral primary osteoarthritis, right knee: Secondary | ICD-10-CM | POA: Diagnosis not present

## 2015-01-20 ENCOUNTER — Other Ambulatory Visit: Payer: Self-pay | Admitting: Family Medicine

## 2015-01-20 ENCOUNTER — Ambulatory Visit
Admission: RE | Admit: 2015-01-20 | Discharge: 2015-01-20 | Disposition: A | Discharge status: Home / Self Care | Payer: Medicare Other | Source: Ambulatory Visit | Attending: Family Medicine | Admitting: Family Medicine

## 2015-01-20 DIAGNOSIS — E7800 Pure hypercholesterolemia, unspecified: Secondary | ICD-10-CM | POA: Diagnosis not present

## 2015-01-20 DIAGNOSIS — I10 Essential (primary) hypertension: Secondary | ICD-10-CM | POA: Diagnosis not present

## 2015-01-20 DIAGNOSIS — Z01818 Encounter for other preprocedural examination: Secondary | ICD-10-CM | POA: Diagnosis not present

## 2015-01-20 DIAGNOSIS — M1990 Unspecified osteoarthritis, unspecified site: Secondary | ICD-10-CM | POA: Diagnosis not present

## 2015-01-20 DIAGNOSIS — I739 Peripheral vascular disease, unspecified: Secondary | ICD-10-CM | POA: Diagnosis not present

## 2015-01-20 DIAGNOSIS — Z79899 Other long term (current) drug therapy: Secondary | ICD-10-CM | POA: Diagnosis not present

## 2015-01-20 DIAGNOSIS — I358 Other nonrheumatic aortic valve disorders: Secondary | ICD-10-CM | POA: Diagnosis not present

## 2015-01-21 ENCOUNTER — Other Ambulatory Visit (HOSPITAL_COMMUNITY): Payer: Self-pay | Admitting: Family Medicine

## 2015-01-21 ENCOUNTER — Telehealth (HOSPITAL_COMMUNITY): Payer: Self-pay | Admitting: *Deleted

## 2015-01-21 DIAGNOSIS — I358 Other nonrheumatic aortic valve disorders: Secondary | ICD-10-CM

## 2015-01-27 ENCOUNTER — Other Ambulatory Visit: Payer: Self-pay

## 2015-01-27 ENCOUNTER — Ambulatory Visit (HOSPITAL_COMMUNITY): Payer: Medicare Other | Attending: Internal Medicine

## 2015-01-27 DIAGNOSIS — I071 Rheumatic tricuspid insufficiency: Secondary | ICD-10-CM | POA: Diagnosis not present

## 2015-01-27 DIAGNOSIS — G4733 Obstructive sleep apnea (adult) (pediatric): Secondary | ICD-10-CM | POA: Insufficient documentation

## 2015-01-27 DIAGNOSIS — I517 Cardiomegaly: Secondary | ICD-10-CM | POA: Diagnosis not present

## 2015-01-27 DIAGNOSIS — E785 Hyperlipidemia, unspecified: Secondary | ICD-10-CM | POA: Diagnosis not present

## 2015-01-27 DIAGNOSIS — I10 Essential (primary) hypertension: Secondary | ICD-10-CM | POA: Insufficient documentation

## 2015-01-27 DIAGNOSIS — I77810 Thoracic aortic ectasia: Secondary | ICD-10-CM | POA: Insufficient documentation

## 2015-01-27 DIAGNOSIS — I5189 Other ill-defined heart diseases: Secondary | ICD-10-CM | POA: Insufficient documentation

## 2015-01-27 DIAGNOSIS — Z87891 Personal history of nicotine dependence: Secondary | ICD-10-CM | POA: Diagnosis not present

## 2015-01-27 DIAGNOSIS — I340 Nonrheumatic mitral (valve) insufficiency: Secondary | ICD-10-CM | POA: Diagnosis not present

## 2015-01-27 DIAGNOSIS — I352 Nonrheumatic aortic (valve) stenosis with insufficiency: Secondary | ICD-10-CM | POA: Diagnosis not present

## 2015-01-27 DIAGNOSIS — Z8249 Family history of ischemic heart disease and other diseases of the circulatory system: Secondary | ICD-10-CM | POA: Insufficient documentation

## 2015-01-27 DIAGNOSIS — I358 Other nonrheumatic aortic valve disorders: Secondary | ICD-10-CM

## 2015-02-04 DIAGNOSIS — M1711 Unilateral primary osteoarthritis, right knee: Secondary | ICD-10-CM | POA: Diagnosis not present

## 2015-02-06 ENCOUNTER — Telehealth: Payer: Self-pay | Admitting: Cardiovascular Disease

## 2015-02-06 NOTE — Telephone Encounter (Signed)
Records received from [facility] for appointment on 02/14/15 with Dr Oval Linsey.  Records given to [facility] (medical records) for Dr Blenda Mounts schedule on 02/14/15. lp

## 2015-02-14 ENCOUNTER — Ambulatory Visit (INDEPENDENT_AMBULATORY_CARE_PROVIDER_SITE_OTHER): Payer: Medicare Other | Admitting: Cardiovascular Disease

## 2015-02-14 ENCOUNTER — Encounter: Payer: Self-pay | Admitting: Cardiovascular Disease

## 2015-02-14 VITALS — BP 150/98 | HR 86 | Ht 72.0 in | Wt 191.6 lb

## 2015-02-14 DIAGNOSIS — I350 Nonrheumatic aortic (valve) stenosis: Secondary | ICD-10-CM | POA: Diagnosis not present

## 2015-02-14 DIAGNOSIS — Z01818 Encounter for other preprocedural examination: Secondary | ICD-10-CM

## 2015-02-14 DIAGNOSIS — E785 Hyperlipidemia, unspecified: Secondary | ICD-10-CM

## 2015-02-14 DIAGNOSIS — I10 Essential (primary) hypertension: Secondary | ICD-10-CM | POA: Diagnosis not present

## 2015-02-14 DIAGNOSIS — I712 Thoracic aortic aneurysm, without rupture, unspecified: Secondary | ICD-10-CM | POA: Insufficient documentation

## 2015-02-14 HISTORY — DX: Thoracic aortic aneurysm, without rupture: I71.2

## 2015-02-14 HISTORY — DX: Essential (primary) hypertension: I10

## 2015-02-14 HISTORY — DX: Thoracic aortic aneurysm, without rupture, unspecified: I71.20

## 2015-02-14 NOTE — Patient Instructions (Signed)
You have been cleared for surgery!  Dr Oval Linsey recommends that you schedule a follow-up appointment in 1 year. You will receive a reminder letter in the mail two months in advance. If you don't receive a letter, please call our office to schedule the follow-up appointment.  If you need a refill on your cardiac medications before your next appointment, please call your pharmacy.

## 2015-02-14 NOTE — Progress Notes (Signed)
Cardiology Office Note   Date:  02/14/2015   ID:  KHYRIN MABERY, DOB [birth year], MRN [id number]  PCP:  Lujean Amel, MD  Cardiologist:   Sharol Harness, MD   Chief Complaint  Patient presents with  . New Evaluation    surgical clearance for a right knee replacement on Jan 9 at 1:30     History of Present Illness: LENNIS CALDEIRA is a 69 y.o. male with vasovagal syncope, hypertension, hyperlipidemia, peripheral arterial disease, and asbestos exposure who presents for presurgical risk assessment.  Mr. Michalski saw his PCP, Dr. Lujean Amel, on 8 October 18. At that appointment he was requesting surgical clearance for a knee surgery. Surgery is scheduled for January 19. An echo was ordered to reassess his aortic valve sclerosis. He was incidentally noted to have coronary calcification on noncontrast head CT. He was referred to cardiology for further evaluation.  Echo showed mild aortic stenosis with a mean gradient of 12 mmHg.  it also showed a mild ascending aortic aneurysm. Mr. Glymph has been doing well. He works out 3-4 times per week for 30-45 minutes each time. He does up her and lower body strengthening machines. He has no chest pain or pressure with these activities and has lost 40 pounds through this activity. He also denies lower extremity edema, orthopnea, or PND. He does have some swelling of the right knee. He previously underwent replacement of the left knee in [id number] without complications.    Mr. Timlin reports that his diet is good. He thought the fruits and vegetables. He does drink 3-4 sodas per day and 3-4 beers each night. He states that his blood pressure is always high when he goes to the doctor. At home his primary care physician is have him check his blood pressures regularly. On average it is 130/80.    Past Medical History  Diagnosis Date  . H/O asbestos exposure   . Hypertension   . Hyperlipidemia   . OSA (obstructive sleep apnea)   . Hearing loss   .  Essential hypertension 02/14/2015  . Thoracic aortic aneurysm without rupture ([place]) 02/14/2015    4.1 cm 01/2015    Past Surgical History  Procedure Laterality Date  . Total knee arthroplasty Left 2011  . Hernia repair  2011  . Hand surgery Right 1983  . Cataract extraction  2011     Current Outpatient Prescriptions  Medication Sig Dispense Refill  . amLODipine (NORVASC) 2.5 MG tablet Take 2.5 mg by mouth daily.    Marland Kitchen aspirin 81 MG tablet Take 81 mg by mouth daily.    Marland Kitchen atorvastatin (LIPITOR) 40 MG tablet Take 40 mg by mouth daily.    Marland Kitchen HYDROcodone-acetaminophen (NORCO/VICODIN) 5-325 MG tablet TAKE 1 TABLET BY MOUTH ONCE DAILY AS NEEDED FOR SEVERE PAIN  0  . Multiple Vitamin (MULTIVITAMIN) capsule Take 1 capsule by mouth daily.    . ranitidine (ZANTAC) 150 MG capsule Use up to every 12 h as needed for heartburn     No current facility-administered medications for this visit.    Allergies:   Review of patient's allergies indicates no known allergies.    Social History:  The patient  reports that he quit smoking about 44 years ago. His smoking use included Cigarettes. He has a 2.5 pack-year smoking history. He has never used smokeless tobacco. He reports that he does not drink alcohol or use illicit drugs.   Family History:  The patient's family history includes Asthma in his  mother; Heart disease in his father; Prostate cancer in his father.    ROS:  Please see the history of present illness.   Otherwise, review of systems are positive for none.   All other systems are reviewed and negative.    PHYSICAL EXAM: VS:  BP 150/98 mmHg  Pulse 86  Ht 6' (1.829 m)  Wt 86.909 kg (191 lb 9.6 oz)  BMI 25.98 kg/m2 , BMI Body mass index is 25.98 kg/(m^2). GENERAL:  Well appearing HEENT:  Pupils equal round and reactive, fundi not visualized, oral mucosa unremarkable NECK:  No jugular venous distention, waveform within normal limits, carotid upstroke brisk and symmetric, no bruits, no  thyromegaly LYMPHATICS:  No cervical adenopathy LUNGS:  Clear to auscultation bilaterally HEART:  RRR.  PMI not displaced or sustained,S1 and S2 within normal limits, no S3, no S4, no clicks, no rubs, II/VI early-peaking systolic murmur at the LUSB. ABD:  Flat, positive bowel sounds normal in frequency in pitch, no bruits, no rebound, no guarding, no midline pulsatile mass, no hepatomegaly, no splenomegaly EXT:  2 plus pulses throughout, no edema, no cyanosis no clubbing SKIN:  No rashes no nodules NEURO:  Cranial nerves II through XII grossly intact, motor grossly intact throughout PSYCH:  Cognitively intact, oriented to person place and time    EKG:  EKG is not ordered today.  Echo 01/27/15: Study Conclusions  - Left ventricle: The cavity size was normal. Wall thickness was increased in a pattern of mild LVH. Systolic function was normal. The estimated ejection fraction was in the range of 55% to 60%. Wall motion was normal; there were no regional wall motion abnormalities. Doppler parameters are consistent with abnormal left ventricular relaxation (grade 1 diastolic dysfunction). The E/e&' ratio is between 8-15, suggesting indeterminate LV filling pressure. - Aortic valve: Trileaflet; mildly calcified leaflets. Mild stenosis. There was trivial regurgitation. Mean gradient (S): 12 mm Hg. Peak gradient (S): 30 mm Hg. Valve area (VTI): 1.78 cm^2. Valve area (Vmean): 1.76 cm^2. - Aorta: Aortic root dimension: 41 mm (ED). - Ascending aorta: The ascending aorta was mildly dilated. - Mitral valve: Calcified annulus. There was trivial regurgitation. - Left atrium: The atrium was normal in size. - Right atrium: The atrium was mildly dilated. - Atrial septum: Mobile IAS. PFO cannot be excluded. - Tricuspid valve: There was mild regurgitation. - Pulmonary arteries: PA peak pressure: 30 mm Hg (S). - Inferior vena cava: The vessel was normal in size.  The respirophasic diameter changes were in the normal range (= 50%), consistent with normal central venous pressure.  Impressions:  - LVEF 55-60%, mild LVH, normal wall motion, diastolic dysfunction, indeterminate LV filling pressure, mild aortic valve stenosis (AVA 1.7-1.8 cm2) with trivial AI, dilated aortic root and ascending aorta to 4.1 cm, mild RAE, mobile IAS - PFO cannot be excluded, mild TR, RVSP 30 mmHg.   Recent Labs: No results found for requested labs within last 365 days.   01/20/15: WBC 7.3, Hgb 13.9, Plt 248 Na 137, K 4.6, BUN 13, Creatinine 0.77 AST 19, ALT 19 Lipid Panel No results found for: CHOL, TRIG, HDL, CHOLHDL, VLDL, LDLCALC, LDLDIRECT    Wt Readings from Last 3 Encounters:  02/14/15 86.909 kg (191 lb 9.6 oz)  10/30/12 94.802 kg (209 lb)  09/12/12 92.897 kg (204 lb 12.8 oz)      ASSESSMENT AND PLAN:  # Presurgical risk assessment: The patient does not have any unstable cardiac conditions.  Upon evaluation today, he can achieve 4 METs  or greater without anginal symptoms.  According to [facility] and AHA guidelines, he requires no further cardiac workup prior to his noncardiac surgery and should be at acceptable risk.  his NSQIP risk of peri-procedural MI or cardiac arrest is 0.17%.  Our service is available as necessary in the perioperative period.  # Hypertension: BP is poorly-controlled today.  He reports white coat hypertension and states that his BP is well-controlled at home.  He will check his BP at home and if it is >140/80, we will need to titrate his antihypertensives.  He reports near syncope when he took amlodipine at higher doses in the past.  # Hyperlipidemia: Continue atorvastatin.  # Aortic stenosis: Mild AS.  We will repeat an echo in 2-3 years.  We discussed warning signs for heart failure and progressive aortic stenosis.  #: Ascending aortic aneurysm: Mild ascending thoracic aortic aneurysm.  Continue to monitor along with the  aortic stenosis.  # Alcohol abuse: Mr. Gorbett is drinking at least 3-4 beers every night and more on the weekend.  We discussed the recommendation to drink no more than 2 drinks per day and no more than 14 in a week.  He expressed understanding and plans to decrease his alcohol.intake.  Current medicines are reviewed at length with the patient today.  The patient does not have concerns regarding medicines.  The following changes have been made:  no change  Labs/ tests ordered today include:  No orders of the defined types were placed in this encounter.     Disposition:   FU with Reiana Poteet C. Oval Linsey, MD in 1 year.   Signed, Sharol Harness, MD  02/14/2015 10:43 PM    [place]

## 2015-03-06 ENCOUNTER — Ambulatory Visit: Payer: Self-pay | Admitting: Orthopedic Surgery

## 2015-03-06 NOTE — Progress Notes (Signed)
Preoperative surgical orders have been place into the Epic hospital system for [name] on 03/06/2015, 1:18 PM  by Mickel Crow for surgery on 04-14-2015.  Preop Total Knee orders including Experal, IV Tylenol, and IV Decadron as long as there are no contraindications to the above medications. Arlee Muslim, PA-C

## 2015-03-14 DIAGNOSIS — M1711 Unilateral primary osteoarthritis, right knee: Secondary | ICD-10-CM | POA: Diagnosis not present

## 2015-03-30 IMAGING — CT CT PARANASAL SINUSES LIMITED
1 of 2 series · 15 of 19 positions shown, 19 images · non-contrast
Comparison: None

Limited maxillofacial CT
HISTORY: Chronic dry cough
TECHNIQUE: Coronal CT images were obtained through selected
regions of the paranasal sinuses without intravenous contrast
material administration.

[Series 4: ltd sinus 3.0 h30s · axial · 0.37mm/px · z∈[-87,+10]mm · 15 of 18 slices shown, 19 images]
[im 2/18  brain]
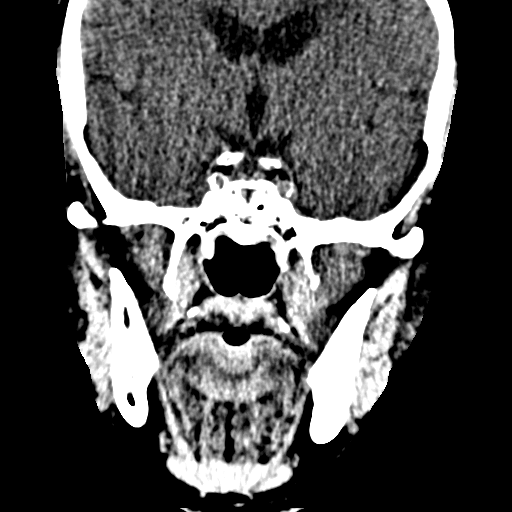
[im 2/18  bone]
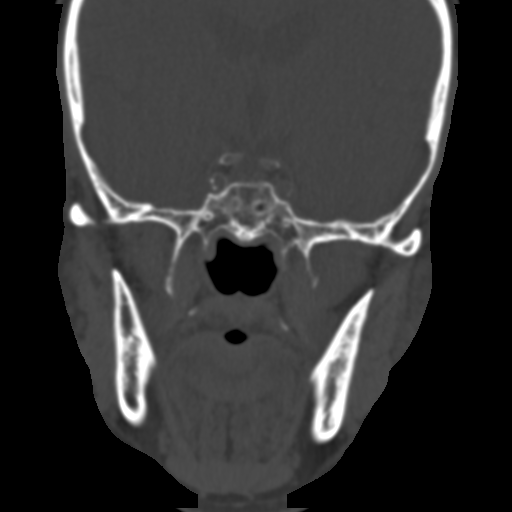
[im 3/18  bone]
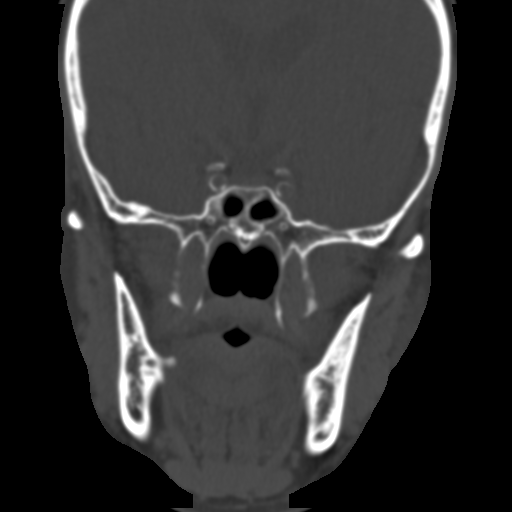
[im 4/18  bone]
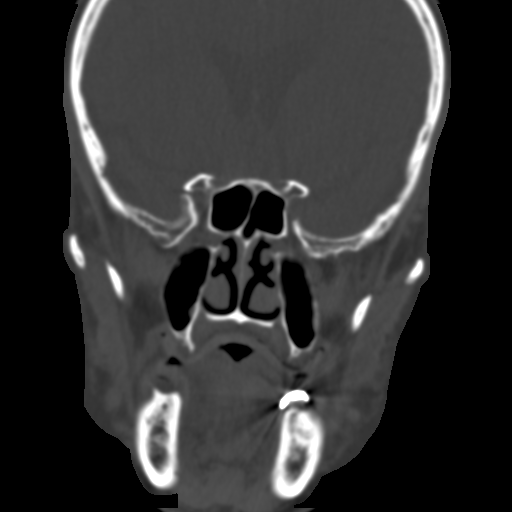
[im 5/18  bone]
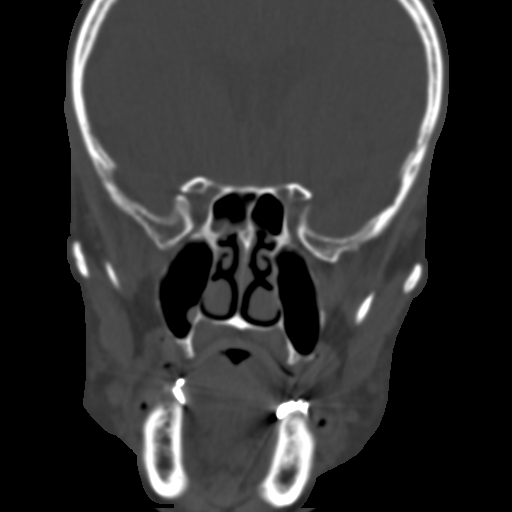
[im 6/18  brain]
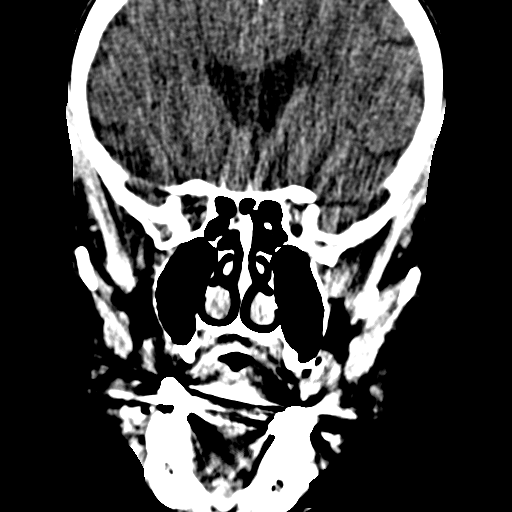
[im 6/18  bone]
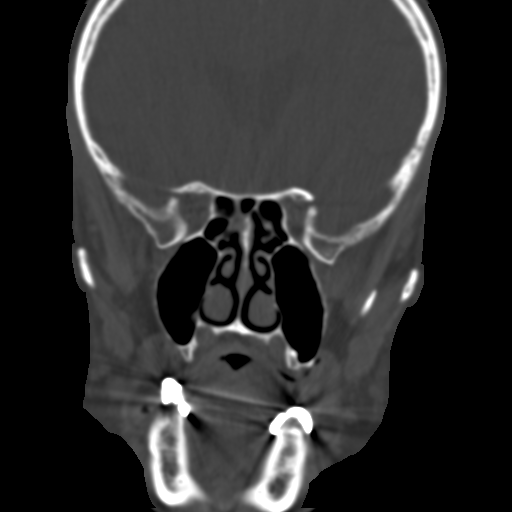
[im 7/18  bone]
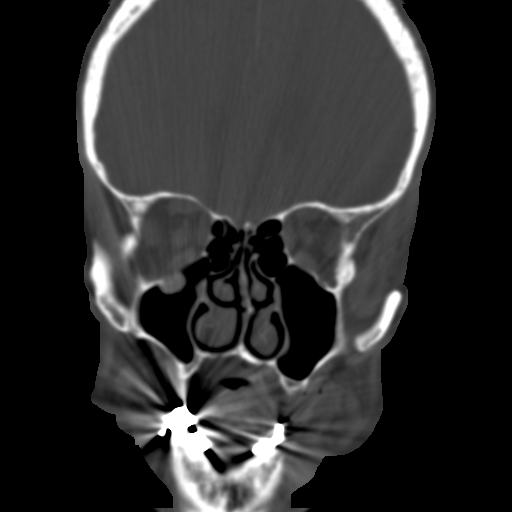
[im 8/18  bone]
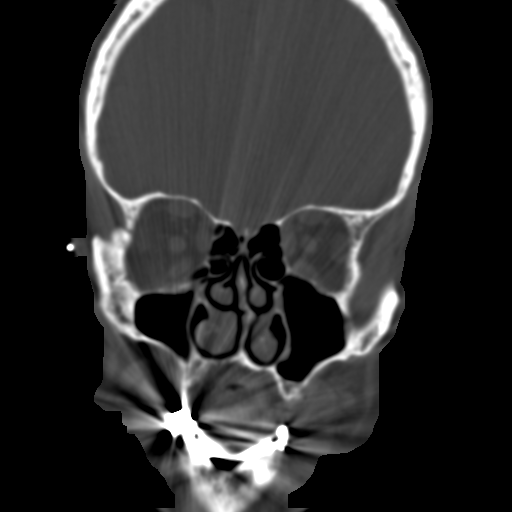
[im 10/18  bone]
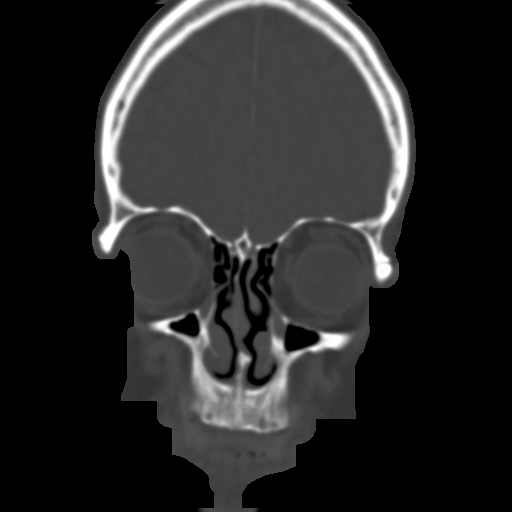
[im 11/18  brain]
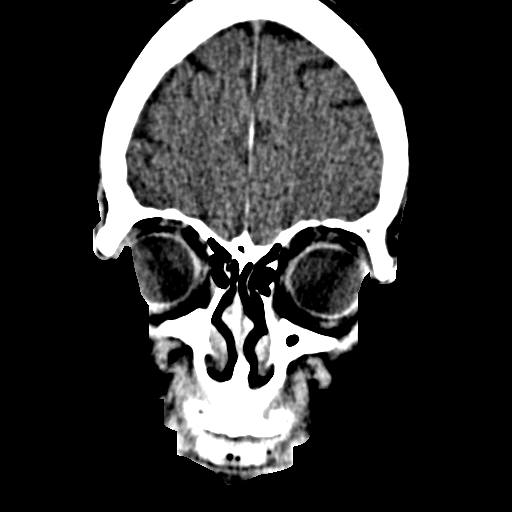
[im 11/18  bone]
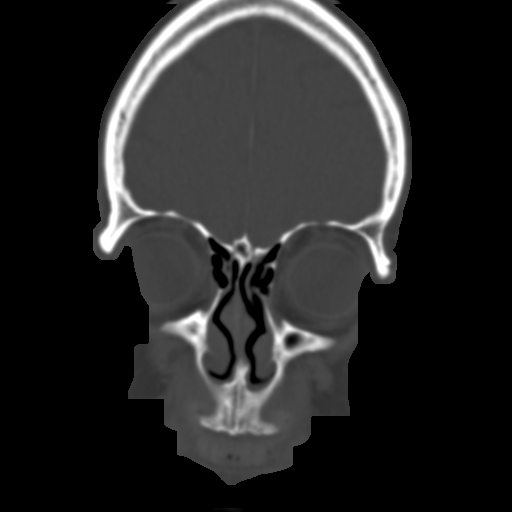
[im 12/18  bone]
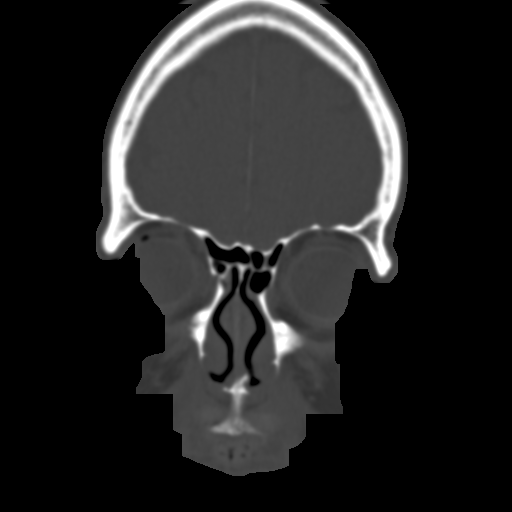
[im 13/18  bone]
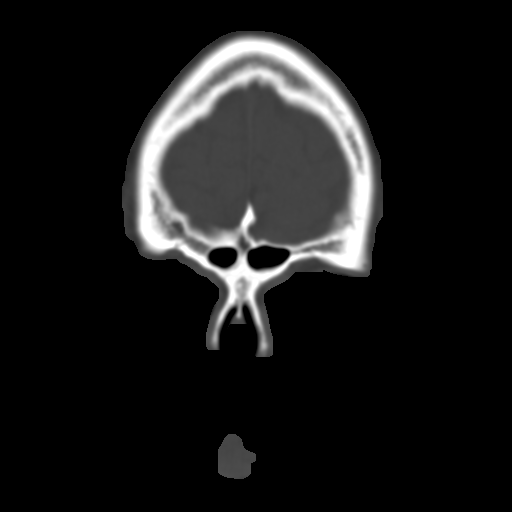
[im 14/18  bone]
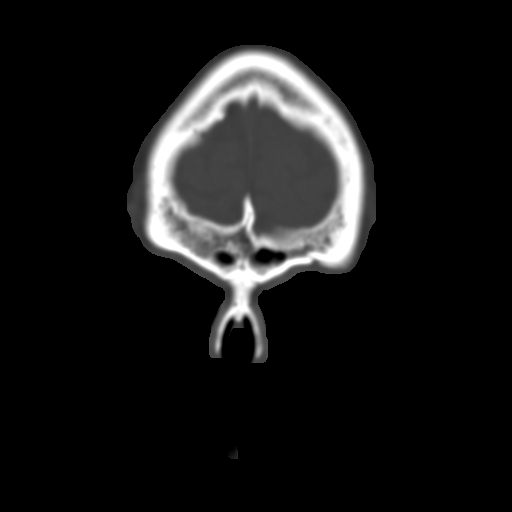
[im 15/18  brain]
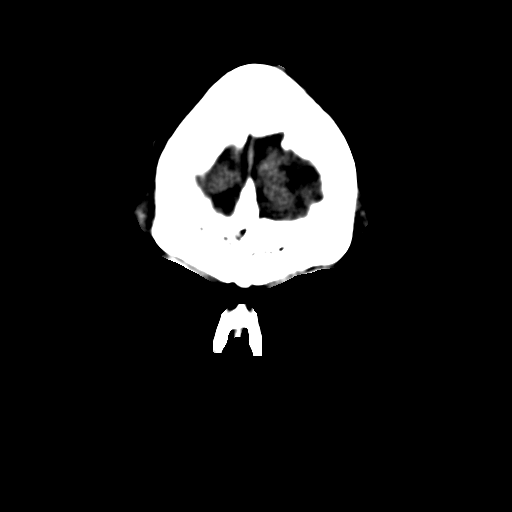
[im 15/18  bone]
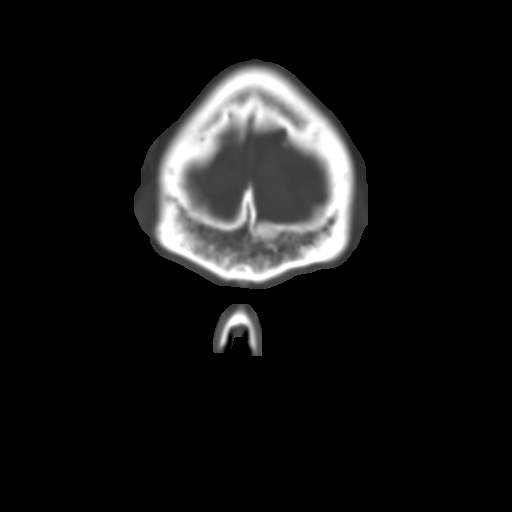
[im 16/18  bone]
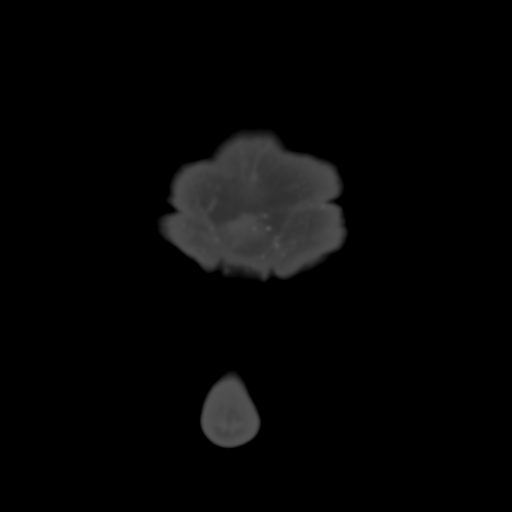
[im 17/18  bone]
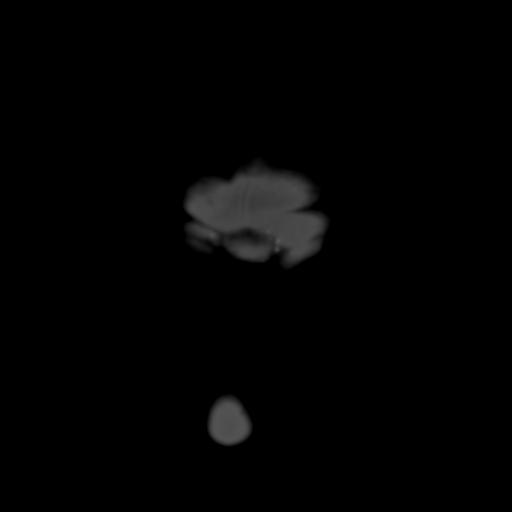

[15 of 19 positions shown; findings below may reference images not displayed]

FINDINGS: There is a retention cyst along the superior aspect of
the right maxillary antrum measuring 1.1 x 0.7 cm.  There is a
second retention cyst in the inferomedial right maxillary antrum
measuring 7 x 4 mm. There is mild mucosal thickening in several
ethmoid air cells bilaterally.  Other paranasal sinuses are clear.
Ostiomeatal unit complexes are patent bilaterally.

There is leftward deviation of the nasal septum with a small bony
spur toward the left.  There is edema of the inferior nasal
turbinates, more on the right than on the left.  There is no nares
obstruction, however.

There are no air-fluid levels.  No bony destruction or expansion.
No orbital abnormalities are appreciated on this study.
CONCLUSION: Two retention cysts in the right maxillary antrum.
Mild mucosal thickening of several ethmoid air cells.  Paranasal
sinuses otherwise are clear.  Ostiomeatal unit complexes
bilaterally are patent.  Mild leftward deviation of nasal septum.
Mild inferior nasal turbinate edema, slightly more on the right
than on the left, without nares obstruction.

## 2015-03-30 IMAGING — CT CT CHEST W/ CM
2 of 4 series · 15 of 36 positions shown, 18 images · IV contrast (omnipaque)
Comparison: Chest x-ray 03/10/2012

CLINICAL DATA: Chronic cough.

CT CHEST WITH CONTRAST
TECHNIQUE: Multidetector CT imaging of the chest was performed
following the standard protocol during bolus administration of
intravenous contrast.
Contrast: 80mL OMNIPAQUE IOHEXOL 300 MG/ML  SOLN

[Series 2: chest routine with · axial · 0.81mm/px · z∈[-303,-43]mm · 12 of 62 slices shown, 15 images]
[im 5/62  mediastinal]
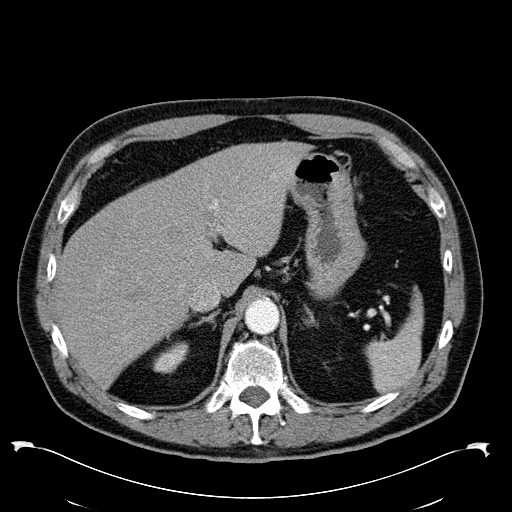
[im 5/62  lung]
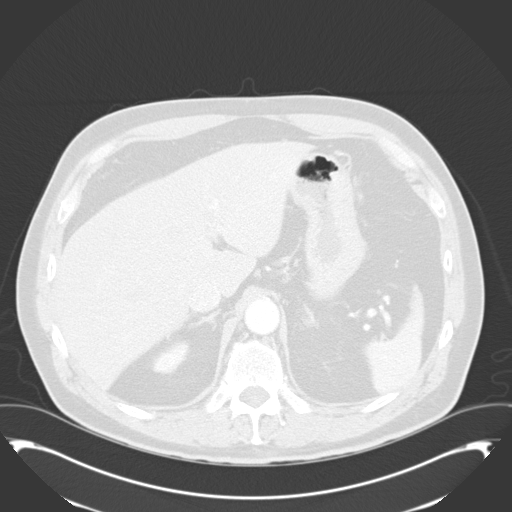
[im 10/62  lung]
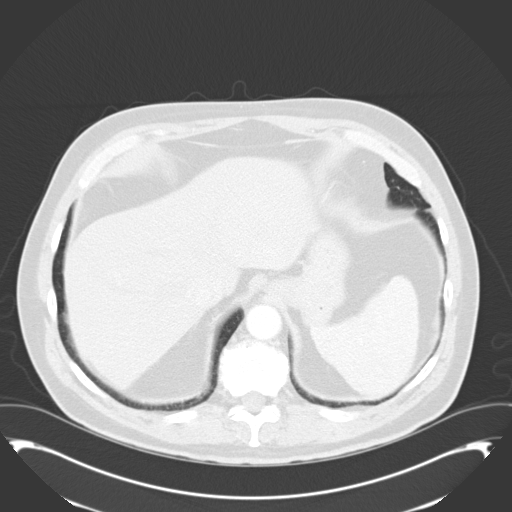
[im 15/62  lung]
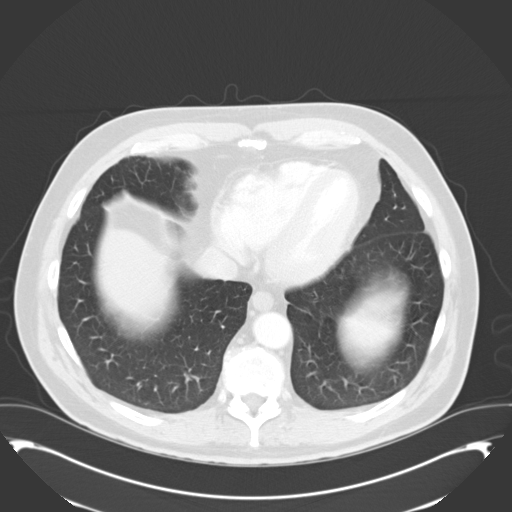
[im 19/62  lung]
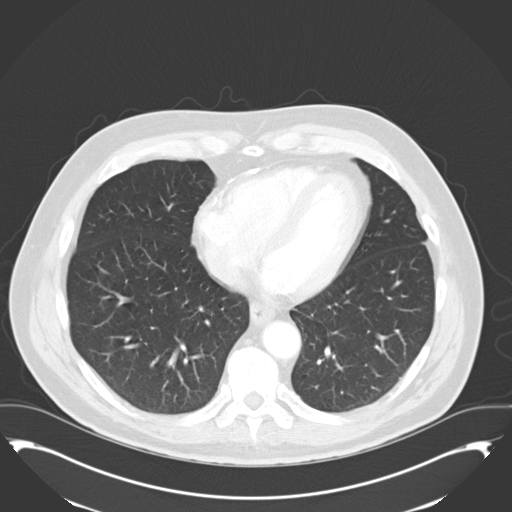
[im 24/62  mediastinal]
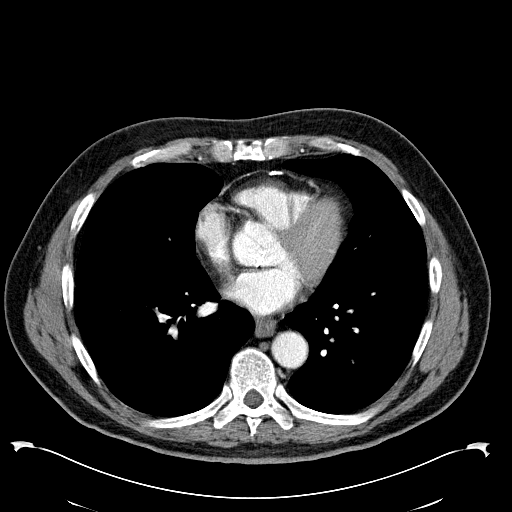
[im 24/62  lung]
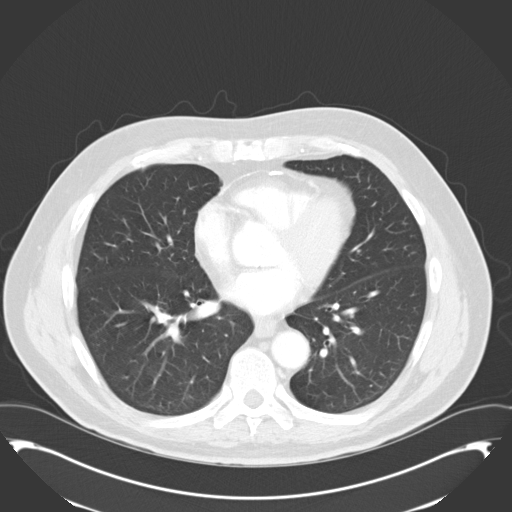
[im 29/62  lung]
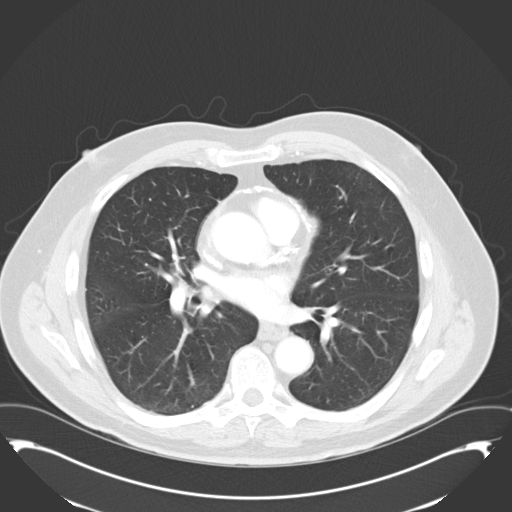
[im 33/62  lung]
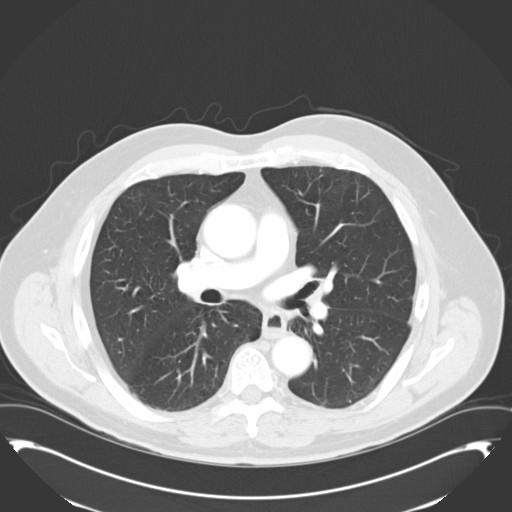
[im 38/62  lung]
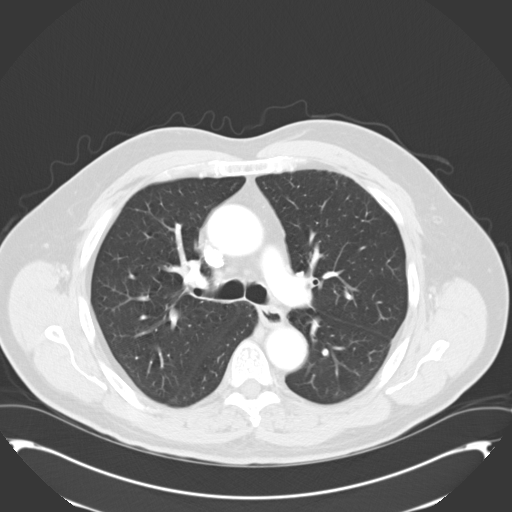
[im 43/62  mediastinal]
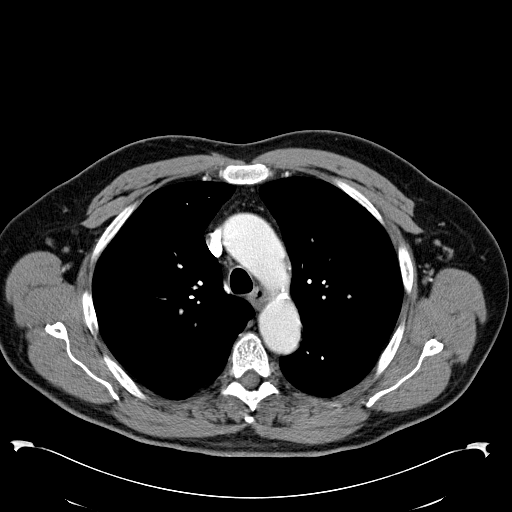
[im 43/62  lung]
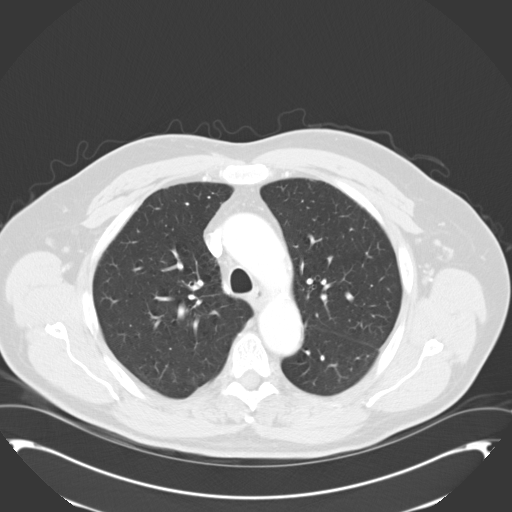
[im 47/62  lung]
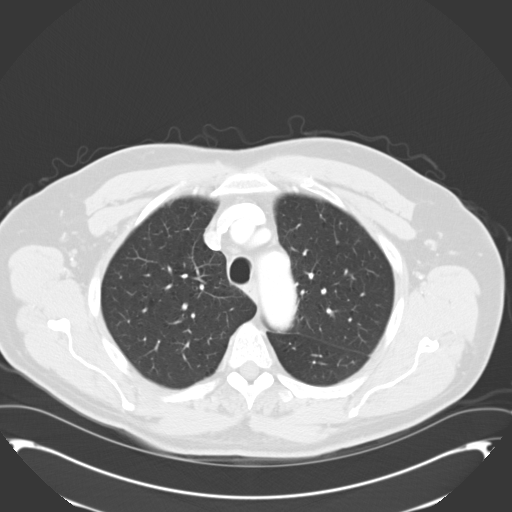
[im 52/62  lung]
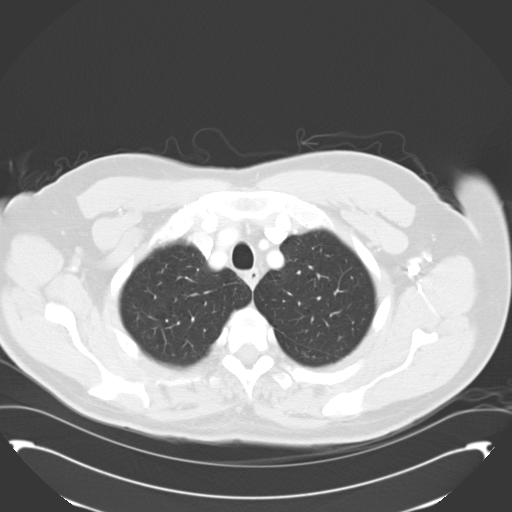
[im 57/62  lung]
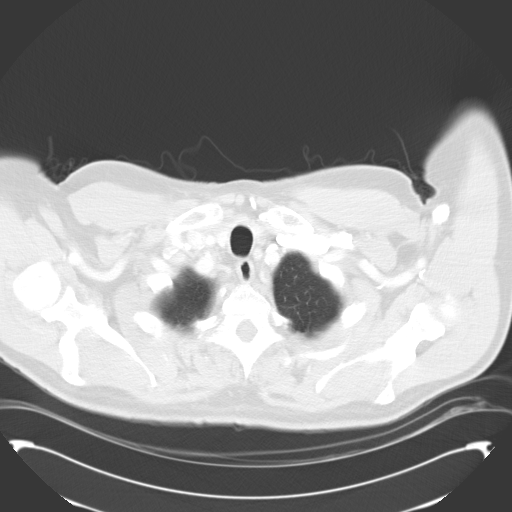

[Series 602: cor · coronal · 0.81mm/px · 3 of 113 slices shown]
[im 23/113  lung]
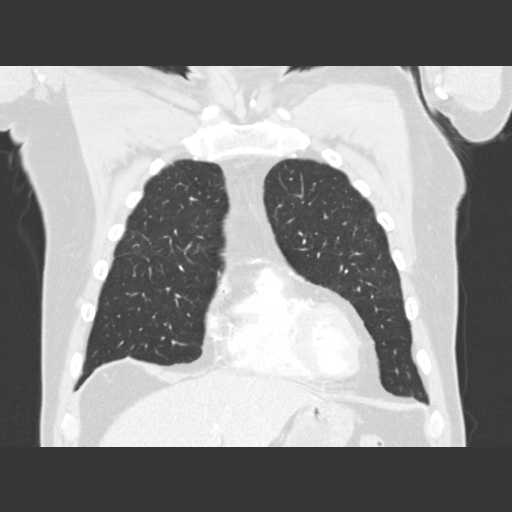
[im 45/113  lung]
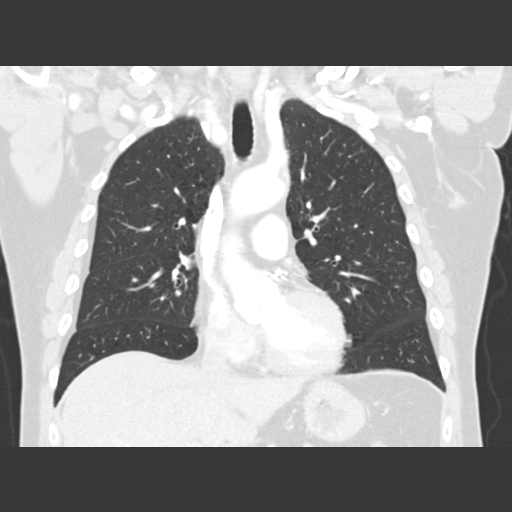
[im 68/113  lung]
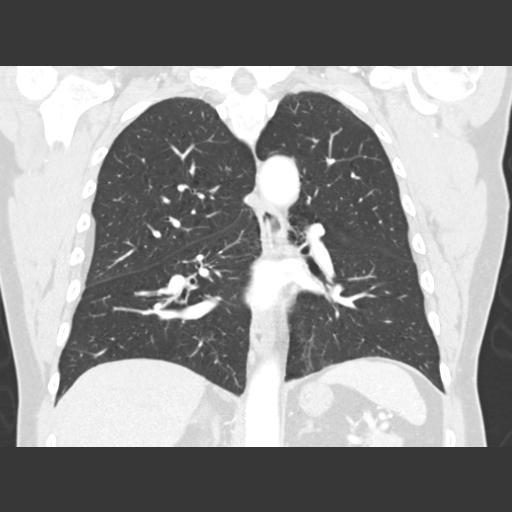

[15 of 36 positions shown; findings below may reference images not displayed]

FINDINGS: Small sebaceous cyst noted in the posterior soft tissues
in the lower neck measuring 2.4 cm on image 1. Heart is normal
size. Aorta is normal caliber.  Calcifications throughout the
pericardium. No mediastinal, hilar, or axillary adenopathy.  Dense
coronary artery calcifications.  They are most pronounced in the
left main coronary artery, circumflex and left anterior descending,
but also noted in the right coronary artery.

Lungs are clear.  No pleural effusions.

Irregular atherosclerotic plaque in the aortic arch near the left
subclavian origin.

Imaging into the upper abdomen shows no acute findings.

No acute bony abnormality.
IMPRESSION: No focal opacities/process in the lungs.

Calcifications throughout pericardium.

Extensive coronary artery calcifications.

Irregular aortic arch plaque near the origin of the left subclavian
artery.

## 2015-04-08 ENCOUNTER — Encounter (HOSPITAL_COMMUNITY)
Admission: RE | Admit: 2015-04-08 | Discharge: 2015-04-08 | Disposition: A | Discharge status: Home / Self Care | Payer: Medicare Other | Source: Ambulatory Visit | Attending: Orthopedic Surgery | Admitting: Orthopedic Surgery

## 2015-04-08 ENCOUNTER — Encounter (HOSPITAL_COMMUNITY): Payer: Self-pay

## 2015-04-08 DIAGNOSIS — Z7901 Long term (current) use of anticoagulants: Secondary | ICD-10-CM | POA: Diagnosis not present

## 2015-04-08 DIAGNOSIS — M1711 Unilateral primary osteoarthritis, right knee: Secondary | ICD-10-CM | POA: Diagnosis not present

## 2015-04-08 DIAGNOSIS — Z01818 Encounter for other preprocedural examination: Secondary | ICD-10-CM | POA: Diagnosis not present

## 2015-04-08 HISTORY — DX: Gastro-esophageal reflux disease without esophagitis: K21.9

## 2015-04-08 HISTORY — DX: Chronic cough: R05.3

## 2015-04-08 HISTORY — DX: Unspecified osteoarthritis, unspecified site: M19.90

## 2015-04-08 HISTORY — DX: Cough: R05

## 2015-04-08 HISTORY — DX: Nocturia: R35.1

## 2015-04-08 HISTORY — DX: Anesthesia of skin: R20.0

## 2015-04-08 HISTORY — DX: Cardiac murmur, unspecified: R01.1

## 2015-04-08 LAB — COMPREHENSIVE METABOLIC PANEL
ALBUMIN: 4.6 g/dL (ref 3.5–5.0)
ALT: 21 U/L (ref 17–63)
ANION GAP: 8 (ref 5–15)
AST: 24 U/L (ref 15–41)
Alkaline Phosphatase: 70 U/L (ref 38–126)
BUN: 13 mg/dL (ref 6–20)
CHLORIDE: 105 mmol/L (ref 101–111)
CO2: 27 mmol/L (ref 22–32)
Calcium: 9.4 mg/dL (ref 8.9–10.3)
Creatinine, Ser: 0.91 mg/dL (ref 0.61–1.24)
GFR calc non Af Amer: >60 mL/min (ref >60)
GLUCOSE: 112 mg/dL — AB (ref 65–99)
POTASSIUM: 4.3 mmol/L (ref 3.5–5.1)
SODIUM: 140 mmol/L (ref 135–145)
Total Bilirubin: 0.8 mg/dL (ref 0.3–1.2)
Total Protein: 7.4 g/dL (ref 6.5–8.1)

## 2015-04-08 LAB — URINALYSIS, ROUTINE W REFLEX MICROSCOPIC
BILIRUBIN URINE: NEGATIVE
GLUCOSE, UA: NEGATIVE mg/dL
HGB URINE DIPSTICK: NEGATIVE
Ketones, ur: NEGATIVE mg/dL
Leukocytes, UA: NEGATIVE
Nitrite: NEGATIVE
PROTEIN: NEGATIVE mg/dL
Specific Gravity, Urine: 1.009 (ref 1.005–1.030)
pH: 7.5 (ref 5.0–8.0)

## 2015-04-08 LAB — CBC
HCT: 43.5 % (ref 39.0–52.0)
HEMOGLOBIN: 14.3 g/dL (ref 13.0–17.0)
MCH: 31 pg (ref 26.0–34.0)
MCHC: 32.9 g/dL (ref 30.0–36.0)
MCV: 94.2 fL (ref 78.0–100.0)
PLATELETS: 323 10*3/uL (ref 150–400)
RBC: 4.62 MIL/uL (ref 4.22–5.81)
RDW: 12.7 % (ref 11.5–15.5)
WBC: 7.6 10*3/uL (ref 4.0–10.5)

## 2015-04-08 LAB — SURGICAL PCR SCREEN
MRSA, PCR: NEGATIVE
Staphylococcus aureus: NEGATIVE

## 2015-04-08 LAB — ABO/RH: ABO/RH(D): A POS

## 2015-04-08 LAB — PROTIME-INR
INR: 0.95 (ref 0.00–1.49)
Prothrombin Time: 12.8 seconds (ref 11.6–15.2)

## 2015-04-08 LAB — APTT: APTT: 30 s (ref 24–37)

## 2015-04-08 NOTE — Progress Notes (Addendum)
Clearance note per chart per Dr Elyn Aquas 01/20/2015 along with H&P  EKG per chart 01/20/2015  CXR/epic 01/20/2015  Cardiac clearance per epic per Dr Oval Linsey 02/14/2015  ECHO per epic 01/27/2015  Spoke with Dr Deatra Canter / anesthesia in regards to H&P. No orders given. Anesthesia to see pt day of surgery.

## 2015-04-08 NOTE — Patient Instructions (Signed)
Justin Sutton  04/08/2015   Your procedure is scheduled on: Monday April 14, 2015  Report to [facility] Main  Entrance take [place]  elevators to 3rd floor to  [place] at 10:45 AM.  Call this number if you have problems the morning of surgery [phone]   Remember: ONLY 1 PERSON MAY GO WITH YOU TO SHORT STAY TO GET  READY MORNING OF [place].  Do not eat food After Midnight but may take clear liquids till 7:45 am day of surgery then nothing by mouth.      Take these medicines the morning of surgery with A SIP OF WATER: Amlodipine (Norvasc); Ranitidine (Zantac);                                You may not have any metal on your body including hair pins and              piercings  Do not wear jewelry, lotions, powders or colognes, deodorant                          Men may shave face and neck.   Do not bring valuables to the hospital. [place].  Contacts, dentures or bridgework may not be worn into surgery.  Leave suitcase in the car. After surgery it may be brought to your room.   Please read over the following fact sheets you were given:MRSA INFORMATION SHEET; INCENTIVE SPIROMETER; [place];  _____________________________________________________________________             [facility] - Preparing for Surgery Before surgery, you can play an important role.  Because skin is not sterile, your skin needs to be as free of germs as possible.  You can reduce the number of germs on your skin by washing with CHG (chlorahexidine gluconate) soap before surgery.  CHG is an antiseptic cleaner which kills germs and bonds with the skin to continue killing germs even after washing. Please DO NOT use if you have an allergy to CHG or antibacterial soaps.  If your skin becomes reddened/irritated stop using the CHG and inform your nurse when you arrive at Short Stay. Do not shave  (including legs and underarms) for at least 48 hours prior to the first CHG shower.  You may shave your face/neck. Please follow these instructions carefully:  1.  Shower with CHG Soap the night before surgery and the  morning of Surgery.  2.  If you choose to wash your hair, wash your hair first as usual with your  normal  shampoo.  3.  After you shampoo, rinse your hair and body thoroughly to remove the  shampoo.                           4.  Use CHG as you would any other liquid soap.  You can apply chg directly  to the skin and wash                       Gently with a scrungie or clean washcloth.  5.  Apply the CHG Soap to your body  ONLY FROM THE NECK DOWN.   Do not use on face/ open                           Wound or open sores. Avoid contact with eyes, ears mouth and genitals (private parts).                       Wash face,  Genitals (private parts) with your normal soap.             6.  Wash thoroughly, paying special attention to the area where your surgery  will be performed.  7.  Thoroughly rinse your body with warm water from the neck down.  8.  DO NOT shower/wash with your normal soap after using and rinsing off  the CHG Soap.                9.  Pat yourself dry with a clean towel.            10.  Wear clean pajamas.            11.  Place clean sheets on your bed the night of your first shower and do not  sleep with pets. Day of Surgery : Do not apply any lotions/deodorants the morning of surgery.  Please wear clean clothes to the hospital/surgery center.  FAILURE TO FOLLOW THESE INSTRUCTIONS MAY RESULT IN THE CANCELLATION OF YOUR SURGERY PATIENT SIGNATURE_________________________________  NURSE SIGNATURE__________________________________  ________________________________________________________________________    CLEAR LIQUID DIET   Foods Allowed                                                                     Foods Excluded  Coffee and tea, regular and decaf                              liquids that you cannot  Plain Jell-O in any flavor                                             see through such as: Fruit ices (not with fruit pulp)                                     milk, soups, orange juice  Iced Popsicles                                    All solid food Carbonated beverages, regular and diet                                    Cranberry, grape and apple juices Sports drinks like Gatorade Lightly seasoned clear broth or consume(fat free) Sugar, honey syrup  Sample Menu Breakfast  Lunch                                     Supper Cranberry juice                    Beef broth                            Chicken broth Jell-O                                     Grape juice                           Apple juice Coffee or tea                        Jell-O                                      Popsicle                                                Coffee or tea                        Coffee or tea  _____________________________________________________________________    Incentive Spirometer  An incentive spirometer is a tool that can help keep your lungs clear and active. This tool measures how well you are filling your lungs with each breath. Taking long deep breaths may help reverse or decrease the chance of developing breathing (pulmonary) problems (especially infection) following:  A long period of time when you are unable to move or be active. BEFORE THE PROCEDURE   If the spirometer includes an indicator to show your best effort, your nurse or respiratory therapist will set it to a desired goal.  If possible, sit up straight or lean slightly forward. Try not to slouch.  Hold the incentive spirometer in an upright position. INSTRUCTIONS FOR USE   Sit on the edge of your bed if possible, or sit up as far as you can in bed or on a chair.  Hold the incentive spirometer in an upright position.  Breathe out  normally.  Place the mouthpiece in your mouth and seal your lips tightly around it.  Breathe in slowly and as deeply as possible, raising the piston or the ball toward the top of the column.  Hold your breath for 3-5 seconds or for as long as possible. Allow the piston or ball to fall to the bottom of the column.  Remove the mouthpiece from your mouth and breathe out normally.  Rest for a few seconds and repeat Steps 1 through 7 at least 10 times every 1-2 hours when you are awake. Take your time and take a few normal breaths between deep breaths.  The spirometer may include an indicator to show your best effort. Use the indicator as a goal to work toward during each repetition.  After each set of 10 deep breaths,  practice coughing to be sure your lungs are clear. If you have an incision (the cut made at the time of surgery), support your incision when coughing by placing a pillow or rolled up towels firmly against it. Once you are able to get out of bed, walk around indoors and cough well. You may stop using the incentive spirometer when instructed by your caregiver.  RISKS AND COMPLICATIONS  Take your time so you do not get dizzy or light-headed.  If you are in pain, you may need to take or ask for pain medication before doing incentive spirometry. It is harder to take a deep breath if you are having pain. AFTER USE  Rest and breathe slowly and easily.  It can be helpful to keep track of a log of your progress. Your caregiver can provide you with a simple table to help with this. If you are using the spirometer at home, follow these instructions: [place] IF:   You are having difficultly using the spirometer.  You have trouble using the spirometer as often as instructed.  Your pain medication is not giving enough relief while using the spirometer.  You develop fever of 100.5 F (38.1 C) or higher. SEEK IMMEDIATE MEDICAL CARE IF:   You cough up bloody sputum that had  not been present before.  You develop fever of 102 F (38.9 C) or greater.  You develop worsening pain at or near the incision site. MAKE SURE YOU:   Understand these instructions.  Will watch your condition.  Will get help right away if you are not doing well or get worse. Document Released: 08/02/2006 Document Revised: 06/14/2011 Document Reviewed: 10/03/2006 ExitCare Patient Information 2014 ExitCare, [place].   ________________________________________________________________________  WHAT IS A BLOOD TRANSFUSION? Blood Transfusion Information  A transfusion is the replacement of blood or some of its parts. Blood is made up of multiple cells which provide different functions.  Red blood cells carry oxygen and are used for blood loss replacement.  White blood cells fight against infection.  Platelets control bleeding.  Plasma helps clot blood.  Other blood products are available for specialized needs, such as hemophilia or other clotting disorders. BEFORE THE TRANSFUSION  Who gives blood for transfusions?   Healthy volunteers who are fully evaluated to make sure their blood is safe. This is blood bank blood. Transfusion therapy is the safest it has ever been in the practice of medicine. Before blood is taken from a donor, a complete history is taken to make sure that person has no history of diseases nor engages in risky social behavior (examples are intravenous drug use or sexual activity with multiple partners). The donor's travel history is screened to minimize risk of transmitting infections, such as malaria. The donated blood is tested for signs of infectious diseases, such as HIV and hepatitis. The blood is then tested to be sure it is compatible with you in order to minimize the chance of a transfusion reaction. If you or a relative donates blood, this is often done in anticipation of surgery and is not appropriate for emergency situations. It takes many days to process the  donated blood. RISKS AND COMPLICATIONS Although transfusion therapy is very safe and saves many lives, the main dangers of transfusion include:   Getting an infectious disease.  Developing a transfusion reaction. This is an allergic reaction to something in the blood you were given. Every precaution is taken to prevent this. The decision to have a blood transfusion has been  considered carefully by your caregiver before blood is given. Blood is not given unless the benefits outweigh the risks. AFTER THE TRANSFUSION  Right after receiving a blood transfusion, you will usually feel much better and more energetic. This is especially true if your red blood cells have gotten low (anemic). The transfusion raises the level of the red blood cells which carry oxygen, and this usually causes an energy increase.  The nurse administering the transfusion will monitor you carefully for complications. HOME CARE INSTRUCTIONS  No special instructions are needed after a transfusion. You may find your energy is better. Speak with your caregiver about any limitations on activity for underlying diseases you may have. SEEK MEDICAL CARE IF:   Your condition is not improving after your transfusion.  You develop redness or irritation at the intravenous (IV) site. SEEK IMMEDIATE MEDICAL CARE IF:  Any of the following symptoms occur over the next 12 hours:  Shaking chills.  You have a temperature by mouth above 102 F (38.9 C), not controlled by medicine.  Chest, back, or muscle pain.  People around you feel you are not acting correctly or are confused.  Shortness of breath or difficulty breathing.  Dizziness and fainting.  You get a rash or develop hives.  You have a decrease in urine output.  Your urine turns a dark color or changes to pink, red, or brown. Any of the following symptoms occur over the next 10 days:  You have a temperature by mouth above 102 F (38.9 C), not controlled by  medicine.  Shortness of breath.  Weakness after normal activity.  The white part of the eye turns yellow (jaundice).  You have a decrease in the amount of urine or are urinating less often.  Your urine turns a dark color or changes to pink, red, or brown. Document Released: 03/19/2000 Document Revised: 06/14/2011 Document Reviewed: 11/06/2007 [facility] Patient Information 2014 [place], [place].  _______________________________________________________________________

## 2015-04-13 ENCOUNTER — Ambulatory Visit: Payer: Self-pay | Admitting: Orthopedic Surgery

## 2015-04-13 NOTE — H&P (Signed)
[name] DOB: 1945-10-29 Married / Language: English / Race: White Male Date of Admission:  04/14/2015 CC:  Right Knee Pain History of Present Illness  The patient is a 70 year old male who comes in for a preoperative History and Physical. The patient is scheduled for a right total knee arthroplasty to be performed by [name]. [name], MD at [facility] on 04-14-2015. The patient reports right knee (medial side pain) symptoms including: pain, swelling, instability (right knee causes pt. to fall a lot.), catching, giving way and popping which began 5 year(s) ago without any known injury. The patient describes the severity of the symptoms as moderate in severity. The patient describes their pain as aching.The patient feels that the symptoms are worsening. Previous work-up for this problem has included knee x-rays. This problem has not been previously treated. The patient reports that symptoms radiate to the right lower leg. Current treatment includes use of a walker ((cane)) and nonsteroidal anti-inflammatory drugs (aleve). This 70 year old gentleman has had problems with his knee for years. He has had a total knee done on the left by Dr. [name] a couple of years ago. He states he has had cortisone injections, viscosupplementation, all of which have not helped very much. Due to his continued pain and discomfort, he comes in to see about total knee arthroplasty on his right knee. He has got bone-on-bone arthritis in the medial and patellofemoral compartments of the right knee. He has got a rotating platform knee on the left in good position with no abnormalities. At this point, the most predictable means of improving pain and function is total knee arthroplasty. He has got advanced arthritic changes in the right knee. They have been treated conservatively in the past for the above stated problem and despite conservative measures, they continue to have progressive pain and severe functional  limitations and dysfunction. They have failed non-operative management including home exercise, medications, and injections. It is felt that they would benefit from undergoing total joint replacement. Risks and benefits of the procedure have been discussed with the patient and they elect to proceed with surgery. There are no active contraindications to surgery such as ongoing infection or rapidly progressive neurological disease.  Problem List/Past Medical  Status post total left knee replacement [id number]  Chronic Ulcerative Colitis  Gastroesophageal Reflux Disease  High blood pressure  Hypercholesterolemia  Localized primary osteoarthritis of right knee Impaired Hearing  bilateral hearing aids Heart murmur  Inguinal Hernia  PAD (Peripheral Artery Disease)  Aortic Valve Sclerosis  Allergies No Known Drug Allergies  Family History Father  Deceased. age 20 Mother  Deceased. age 78  Social History  Not under pain contract  No history of drug/alcohol rehab  Number of flights of stairs before winded  2-3 Tobacco use  Former smoker. 01/01/2015: smoke(d) 3 more pack(s) per day Tobacco / smoke exposure  01/01/2015: no Marital status  married Current drinker  01/01/2015: Currently drinks beer 5-7 times per week, 3-4 beers a day Children  3 Current work status  retired Living situation  live with spouse Exercise  Exercises daily; does running / walking and gym / [employer] Advance Directives  Living Will, Healthcare POA [place] following the Right TKA on 04/14/2015  Medication History  RaNITidine HCl (150MG  Capsule, Oral daily) Active. AmLODIPine Besylate (2.5MG  Tablet, Oral daily) Active. Aspirin (81MG  Tablet DR, Oral daily) Active. Lipitor (10MG  Tablet, Oral) Active. Aleve (220MG  Capsule, 1 (one) Oral) Active. Multiple Vitamin (1 (one) Oral)  Active.  Past Surgical History Cataract Surgery  bilateral Colon Polyp Removal - Colonoscopy   Inguinal Hernia Repair  laparoscopic: right Total Knee Replacement  left    Review of System General Not Present- Chills, Fatigue, Fever, Memory Loss, Night Sweats, Weight Gain and Weight Loss. Skin Not Present- Eczema, Hives, Itching, Lesions and Rash. HEENT Present- Tinnitus. Not Present- Dentures, Double Vision, Headache, Hearing Loss and Visual Loss. Respiratory Present- Chronic Cough (attribute to blood pressure medication). Not Present- Allergies, Coughing up blood, Shortness of breath at rest and Shortness of breath with exertion. Cardiovascular Not Present- Chest Pain, Difficulty Breathing Lying Down, Murmur, Palpitations, Racing/skipping heartbeats and Swelling. Gastrointestinal Not Present- Abdominal Pain, Bloody Stool, Constipation, Diarrhea, Difficulty Swallowing, Heartburn, Jaundice, Loss of appetitie, Nausea and Vomiting. Male Genitourinary Not Present- Blood in Urine, Discharge, Flank Pain, Incontinence, Painful Urination, Urgency, Urinary frequency, Urinary Retention, Urinating at Night and Weak urinary stream. Musculoskeletal Present- Joint Pain. Not Present- Back Pain, Joint Swelling, Morning Stiffness, Muscle Pain, Muscle Weakness and Spasms. Neurological Not Present- Blackout spells, Difficulty with balance, Dizziness, Paralysis, Tremor and Weakness. Psychiatric Not Present- Insomnia.  Vitals Weight: 71 lb Height: 180in Weight was reported by patient. Height was reported by patient. Body Surface Area: 2.67 m Body Mass Index: 1.54 kg/m  BP: 154/100 (Sitting, Left Arm, Standard)  Physical Exam General Mental Status -Alert, cooperative and good historian. General Appearance-pleasant, Not in acute distress. Orientation-Oriented X3. Build & Nutrition-Well nourished and Well developed.  Head and Neck Head-normocephalic, atraumatic . Neck Global Assessment - supple, no bruit auscultated on the right, no bruit auscultated on the  left.  Eye Pupil - Bilateral-Regular and Round. Motion - Bilateral-EOMI.  ENMT Note: upper and lower dentures   Chest and Lung Exam Auscultation Breath sounds - clear at anterior chest wall and clear at posterior chest wall. Adventitious sounds - No Adventitious sounds.  Cardiovascular Auscultation Rhythm - Regular rate and rhythm. Heart Sounds - S1 WNL and S2 WNL. Murmurs & Other Heart Sounds: Murmur 1 - Location - Aortic Area and Sternal Border - Left. Timing - Mid-systolic. Grade - III/VI. Character - Crescendo/Decrescendo.  Abdomen Palpation/Percussion Tenderness - Abdomen is non-tender to palpation. Rigidity (guarding) - Abdomen is soft. Auscultation Auscultation of the abdomen reveals - Bowel sounds normal.  Male Genitourinary Note: Not done, not pertinent to present illness   Musculoskeletal Note: On exam, he is in no distress. His right knee shows no effusion. Range is about 5 to 125. There is marked crepitus on range of motion with tenderness medial greater than lateral with no instability noted. Pulse, sensation and motor are intact.  RADIOGRAPHS Radiographs are reviewed AP both knees and lateral. He has got bone-on-bone arthritis in the medial and patellofemoral compartments of the right knee. He has got a rotating platform knee on the left in good position with no abnormalities.  Assessment & Plan Primary osteoarthritis of right knee (M17.11) Status post total left knee replacement [id number])  Note:Surgical Plans: Right Total Knee Replacement  Disposition: Home  PCP: Dr. Dorthy Cooler - Patient has been seen preoperatively and felt to be stable for surgery.  Topical TXA - PAD  Anesthesia Issues: None  Signed electronically by Joelene Millin, III PA-C

## 2015-04-14 ENCOUNTER — Inpatient Hospital Stay (HOSPITAL_COMMUNITY)
Admission: RE | Admit: 2015-04-14 | Discharge: 2015-04-15 | DRG: 470 | Disposition: A | Discharge status: Home Health | Payer: Medicare Other | Source: Ambulatory Visit | Attending: Orthopedic Surgery | Admitting: Orthopedic Surgery

## 2015-04-14 ENCOUNTER — Encounter (HOSPITAL_COMMUNITY): Payer: Self-pay | Admitting: *Deleted

## 2015-04-14 ENCOUNTER — Encounter (HOSPITAL_COMMUNITY)
Admission: RE | Disposition: A | Discharge status: Home / Self Care | Payer: Self-pay | Source: Ambulatory Visit | Attending: Orthopedic Surgery

## 2015-04-14 ENCOUNTER — Inpatient Hospital Stay (HOSPITAL_COMMUNITY): Payer: Medicare Other | Admitting: Anesthesiology

## 2015-04-14 DIAGNOSIS — I10 Essential (primary) hypertension: Secondary | ICD-10-CM | POA: Diagnosis present

## 2015-04-14 DIAGNOSIS — Z7982 Long term (current) use of aspirin: Secondary | ICD-10-CM

## 2015-04-14 DIAGNOSIS — G4733 Obstructive sleep apnea (adult) (pediatric): Secondary | ICD-10-CM | POA: Diagnosis present

## 2015-04-14 DIAGNOSIS — Z87891 Personal history of nicotine dependence: Secondary | ICD-10-CM | POA: Diagnosis not present

## 2015-04-14 DIAGNOSIS — M179 Osteoarthritis of knee, unspecified: Secondary | ICD-10-CM | POA: Diagnosis present

## 2015-04-14 DIAGNOSIS — I739 Peripheral vascular disease, unspecified: Secondary | ICD-10-CM | POA: Diagnosis present

## 2015-04-14 DIAGNOSIS — E785 Hyperlipidemia, unspecified: Secondary | ICD-10-CM | POA: Diagnosis present

## 2015-04-14 DIAGNOSIS — Z96652 Presence of left artificial knee joint: Secondary | ICD-10-CM | POA: Diagnosis present

## 2015-04-14 DIAGNOSIS — M1710 Unilateral primary osteoarthritis, unspecified knee: Secondary | ICD-10-CM | POA: Diagnosis present

## 2015-04-14 DIAGNOSIS — K219 Gastro-esophageal reflux disease without esophagitis: Secondary | ICD-10-CM | POA: Diagnosis not present

## 2015-04-14 DIAGNOSIS — M25761 Osteophyte, right knee: Secondary | ICD-10-CM | POA: Diagnosis present

## 2015-04-14 DIAGNOSIS — M1711 Unilateral primary osteoarthritis, right knee: Secondary | ICD-10-CM | POA: Diagnosis not present

## 2015-04-14 HISTORY — PX: TOTAL KNEE ARTHROPLASTY: SHX125

## 2015-04-14 LAB — TYPE AND SCREEN
ABO/RH(D): A POS
ANTIBODY SCREEN: NEGATIVE

## 2015-04-14 SURGERY — ARTHROPLASTY, KNEE, TOTAL
Anesthesia: Spinal | Site: Knee | Laterality: Right

## 2015-04-14 MED ORDER — HYDROMORPHONE HCL 1 MG/ML IJ SOLN: 0.2500 mg | INTRAMUSCULAR | Status: DC | PRN

## 2015-04-14 MED ORDER — PROPOFOL 10 MG/ML IV BOLUS
INTRAVENOUS | Status: AC
  Filled 2015-04-14: qty 20

## 2015-04-14 MED ORDER — TRANEXAMIC ACID 1000 MG/10ML IV SOLN
2000.0000 mg | Freq: Once | INTRAVENOUS | Status: DC
  Filled 2015-04-14: qty 20

## 2015-04-14 MED ORDER — FENTANYL CITRATE (PF) 100 MCG/2ML IJ SOLN
INTRAMUSCULAR | Status: AC
  Filled 2015-04-14: qty 2

## 2015-04-14 MED ORDER — LACTATED RINGERS IV SOLN
INTRAVENOUS | Status: DC | PRN
  Administered 2015-04-14 (×3): via INTRAVENOUS

## 2015-04-14 MED ORDER — MIDAZOLAM HCL 2 MG/2ML IJ SOLN
INTRAMUSCULAR | Status: AC
  Filled 2015-04-14: qty 2

## 2015-04-14 MED ORDER — ACETAMINOPHEN 500 MG PO TABS
1000.0000 mg | ORAL_TABLET | Freq: Four times a day (QID) | ORAL | Status: DC
  Administered 2015-04-14 – 2015-04-15 (×3): 1000 mg via ORAL
  Filled 2015-04-14 (×4): qty 2

## 2015-04-14 MED ORDER — PHENYLEPHRINE HCL 10 MG/ML IJ SOLN
INTRAMUSCULAR | Status: DC | PRN
  Administered 2015-04-14 (×2): 40 ug via INTRAVENOUS

## 2015-04-14 MED ORDER — MENTHOL 3 MG MT LOZG: 1.0000 | LOZENGE | OROMUCOSAL | Status: DC | PRN

## 2015-04-14 MED ORDER — CEFAZOLIN SODIUM-DEXTROSE 2-3 GM-% IV SOLR
INTRAVENOUS | Status: AC
  Filled 2015-04-14: qty 50

## 2015-04-14 MED ORDER — ATORVASTATIN CALCIUM 40 MG PO TABS
40.0000 mg | ORAL_TABLET | Freq: Every day | ORAL | Status: DC
  Administered 2015-04-15: 40 mg via ORAL
  Filled 2015-04-14: qty 1

## 2015-04-14 MED ORDER — BUPIVACAINE IN DEXTROSE 0.75-8.25 % IT SOLN
INTRATHECAL | Status: DC | PRN
  Administered 2015-04-14: 1.8 mL via INTRATHECAL

## 2015-04-14 MED ORDER — DEXAMETHASONE SODIUM PHOSPHATE 10 MG/ML IJ SOLN
10.0000 mg | Freq: Once | INTRAMUSCULAR | Status: AC
  Administered 2015-04-15: 10 mg via INTRAVENOUS
  Filled 2015-04-14: qty 1

## 2015-04-14 MED ORDER — ONDANSETRON HCL 4 MG/2ML IJ SOLN
INTRAMUSCULAR | Status: DC | PRN
  Administered 2015-04-14: 4 mg via INTRAVENOUS

## 2015-04-14 MED ORDER — ONDANSETRON HCL 4 MG PO TABS: 4.0000 mg | ORAL_TABLET | Freq: Four times a day (QID) | ORAL | Status: DC | PRN

## 2015-04-14 MED ORDER — FENTANYL CITRATE (PF) 100 MCG/2ML IJ SOLN
INTRAMUSCULAR | Status: DC | PRN
  Administered 2015-04-14: 100 ug via INTRAVENOUS

## 2015-04-14 MED ORDER — PROPOFOL 500 MG/50ML IV EMUL
INTRAVENOUS | Status: DC | PRN
  Administered 2015-04-14: 100 ug/kg/min via INTRAVENOUS

## 2015-04-14 MED ORDER — METHOCARBAMOL 500 MG PO TABS
500.0000 mg | ORAL_TABLET | Freq: Four times a day (QID) | ORAL | Status: DC | PRN
  Administered 2015-04-14 – 2015-04-15 (×2): 500 mg via ORAL
  Filled 2015-04-14 (×2): qty 1

## 2015-04-14 MED ORDER — BUPIVACAINE HCL (PF) 0.25 % IJ SOLN
INTRAMUSCULAR | Status: AC
  Filled 2015-04-14: qty 30

## 2015-04-14 MED ORDER — RIVAROXABAN 10 MG PO TABS
10.0000 mg | ORAL_TABLET | Freq: Every day | ORAL | Status: DC
  Administered 2015-04-15: 10 mg via ORAL
  Filled 2015-04-14 (×2): qty 1

## 2015-04-14 MED ORDER — BUPIVACAINE LIPOSOME 1.3 % IJ SUSP
INTRAMUSCULAR | Status: DC | PRN
  Administered 2015-04-14: 20 mL

## 2015-04-14 MED ORDER — DOCUSATE SODIUM 100 MG PO CAPS
100.0000 mg | ORAL_CAPSULE | Freq: Two times a day (BID) | ORAL | Status: DC
  Administered 2015-04-14 – 2015-04-15 (×2): 100 mg via ORAL

## 2015-04-14 MED ORDER — DEXAMETHASONE SODIUM PHOSPHATE 10 MG/ML IJ SOLN
INTRAMUSCULAR | Status: AC
  Filled 2015-04-14: qty 1

## 2015-04-14 MED ORDER — LIDOCAINE HCL (CARDIAC) 20 MG/ML IV SOLN
INTRAVENOUS | Status: DC | PRN
  Administered 2015-04-14: 50 mg via INTRAVENOUS

## 2015-04-14 MED ORDER — METHOCARBAMOL 1000 MG/10ML IJ SOLN
500.0000 mg | Freq: Four times a day (QID) | INTRAVENOUS | Status: DC | PRN
  Filled 2015-04-14: qty 5

## 2015-04-14 MED ORDER — CEFAZOLIN SODIUM-DEXTROSE 2-3 GM-% IV SOLR
2.0000 g | Freq: Four times a day (QID) | INTRAVENOUS | Status: AC
  Administered 2015-04-14 – 2015-04-15 (×2): 2 g via INTRAVENOUS
  Filled 2015-04-14 (×2): qty 50

## 2015-04-14 MED ORDER — FAMOTIDINE 20 MG PO TABS
20.0000 mg | ORAL_TABLET | Freq: Every day | ORAL | Status: DC
  Administered 2015-04-15: 20 mg via ORAL
  Filled 2015-04-14: qty 1

## 2015-04-14 MED ORDER — OXYCODONE HCL 5 MG PO TABS
5.0000 mg | ORAL_TABLET | ORAL | Status: DC | PRN
  Administered 2015-04-14 – 2015-04-15 (×3): 10 mg via ORAL
  Filled 2015-04-14 (×3): qty 2

## 2015-04-14 MED ORDER — TRAMADOL HCL 50 MG PO TABS: 50.0000 mg | ORAL_TABLET | Freq: Four times a day (QID) | ORAL | Status: DC | PRN

## 2015-04-14 MED ORDER — PHENOL 1.4 % MT LIQD
1.0000 | OROMUCOSAL | Status: DC | PRN
Start: 2015-04-14 — End: 2015-04-15
  Filled 2015-04-14: qty 177

## 2015-04-14 MED ORDER — METOCLOPRAMIDE HCL 5 MG/ML IJ SOLN: 5.0000 mg | Freq: Three times a day (TID) | INTRAMUSCULAR | Status: DC | PRN

## 2015-04-14 MED ORDER — FLEET ENEMA 7-19 GM/118ML RE ENEM: 1.0000 | ENEMA | Freq: Once | RECTAL | Status: DC | PRN

## 2015-04-14 MED ORDER — POLYETHYLENE GLYCOL 3350 17 G PO PACK: 17.0000 g | PACK | Freq: Every day | ORAL | Status: DC | PRN

## 2015-04-14 MED ORDER — ACETAMINOPHEN 650 MG RE SUPP: 650.0000 mg | Freq: Four times a day (QID) | RECTAL | Status: DC | PRN

## 2015-04-14 MED ORDER — ACETAMINOPHEN 325 MG PO TABS: 650.0000 mg | ORAL_TABLET | Freq: Four times a day (QID) | ORAL | Status: DC | PRN

## 2015-04-14 MED ORDER — SODIUM CHLORIDE 0.9 % IJ SOLN
INTRAMUSCULAR | Status: DC | PRN
  Administered 2015-04-14: 30 mL

## 2015-04-14 MED ORDER — MORPHINE SULFATE (PF) 2 MG/ML IV SOLN
1.0000 mg | INTRAVENOUS | Status: DC | PRN
  Administered 2015-04-14: 1 mg via INTRAVENOUS
  Filled 2015-04-14: qty 1

## 2015-04-14 MED ORDER — LACTATED RINGERS IV SOLN: INTRAVENOUS | Status: DC

## 2015-04-14 MED ORDER — BISACODYL 10 MG RE SUPP: 10.0000 mg | Freq: Every day | RECTAL | Status: DC | PRN

## 2015-04-14 MED ORDER — SODIUM CHLORIDE 0.9 % IV SOLN
INTRAVENOUS | Status: DC
  Administered 2015-04-14: 18:00:00 via INTRAVENOUS

## 2015-04-14 MED ORDER — CEFAZOLIN SODIUM-DEXTROSE 2-3 GM-% IV SOLR
2.0000 g | INTRAVENOUS | Status: AC
  Administered 2015-04-14: 2 g via INTRAVENOUS

## 2015-04-14 MED ORDER — MIDAZOLAM HCL 5 MG/5ML IJ SOLN
INTRAMUSCULAR | Status: DC | PRN
  Administered 2015-04-14: 2 mg via INTRAVENOUS

## 2015-04-14 MED ORDER — DIPHENHYDRAMINE HCL 12.5 MG/5ML PO ELIX
12.5000 mg | ORAL_SOLUTION | ORAL | Status: DC | PRN
Start: 2015-04-14 — End: 2015-04-15

## 2015-04-14 MED ORDER — SODIUM CHLORIDE 0.9 % IR SOLN
Status: DC | PRN
  Administered 2015-04-14: 1000 mL

## 2015-04-14 MED ORDER — BUPIVACAINE LIPOSOME 1.3 % IJ SUSP
20.0000 mL | Freq: Once | INTRAMUSCULAR | Status: DC
  Filled 2015-04-14: qty 20

## 2015-04-14 MED ORDER — BUPIVACAINE HCL 0.25 % IJ SOLN
INTRAMUSCULAR | Status: DC | PRN
  Administered 2015-04-14: 20 mL

## 2015-04-14 MED ORDER — ACETAMINOPHEN 10 MG/ML IV SOLN
1000.0000 mg | Freq: Once | INTRAVENOUS | Status: AC
  Administered 2015-04-14: 1000 mg via INTRAVENOUS
  Filled 2015-04-14: qty 100

## 2015-04-14 MED ORDER — AMLODIPINE BESYLATE 2.5 MG PO TABS
2.5000 mg | ORAL_TABLET | Freq: Every day | ORAL | Status: DC
  Administered 2015-04-15: 2.5 mg via ORAL
  Filled 2015-04-14: qty 1

## 2015-04-14 MED ORDER — SODIUM CHLORIDE 0.9 % IJ SOLN
INTRAMUSCULAR | Status: AC
  Filled 2015-04-14: qty 50

## 2015-04-14 MED ORDER — SODIUM CHLORIDE 0.9 % IV SOLN: INTRAVENOUS | Status: DC

## 2015-04-14 MED ORDER — ONDANSETRON HCL 4 MG/2ML IJ SOLN: 4.0000 mg | Freq: Four times a day (QID) | INTRAMUSCULAR | Status: DC | PRN

## 2015-04-14 MED ORDER — TRANEXAMIC ACID 1000 MG/10ML IV SOLN
2000.0000 mg | INTRAVENOUS | Status: DC | PRN
  Administered 2015-04-14: 2000 mg via TOPICAL

## 2015-04-14 MED ORDER — DEXAMETHASONE SODIUM PHOSPHATE 10 MG/ML IJ SOLN
10.0000 mg | Freq: Once | INTRAMUSCULAR | Status: AC
  Administered 2015-04-14: 10 mg via INTRAVENOUS

## 2015-04-14 MED ORDER — ACETAMINOPHEN 10 MG/ML IV SOLN
INTRAVENOUS | Status: AC
  Filled 2015-04-14: qty 100

## 2015-04-14 MED ORDER — METOCLOPRAMIDE HCL 10 MG PO TABS
5.0000 mg | ORAL_TABLET | Freq: Three times a day (TID) | ORAL | Status: DC | PRN
Start: 2015-04-14 — End: 2015-04-15

## 2015-04-14 MED ORDER — CHLORHEXIDINE GLUCONATE 4 % EX LIQD: 60.0000 mL | Freq: Once | CUTANEOUS | Status: DC

## 2015-04-14 MED ORDER — PHENYLEPHRINE 40 MCG/ML (10ML) SYRINGE FOR IV PUSH (FOR BLOOD PRESSURE SUPPORT)
PREFILLED_SYRINGE | INTRAVENOUS | Status: AC
  Filled 2015-04-14: qty 10

## 2015-04-14 MED ORDER — ONDANSETRON HCL 4 MG/2ML IJ SOLN
INTRAMUSCULAR | Status: AC
  Filled 2015-04-14: qty 2

## 2015-04-14 SURGICAL SUPPLY — 53 items
BAG DECANTER FOR FLEXI CONT (MISCELLANEOUS) ×3 IMPLANT
BAG SPEC THK2 15X12 ZIP CLS (MISCELLANEOUS) ×1
BAG ZIPLOCK 12X15 (MISCELLANEOUS) ×3 IMPLANT
BANDAGE ACE 6X5 VEL STRL LF (GAUZE/BANDAGES/DRESSINGS) ×1 IMPLANT
BANDAGE ELASTIC 6 VELCRO ST LF (GAUZE/BANDAGES/DRESSINGS) ×2 IMPLANT
BLADE SAG 18X100X1.27 (BLADE) ×3 IMPLANT
BLADE SAW SGTL 11.0X1.19X90.0M (BLADE) ×3 IMPLANT
BOWL SMART MIX CTS (DISPOSABLE) ×3 IMPLANT
CAP KNEE TOTAL 3 SIGMA ×2 IMPLANT
CEMENT HV SMART SET (Cement) ×6 IMPLANT
CLOSURE WOUND 1/2 X4 (GAUZE/BANDAGES/DRESSINGS) ×1
CLOTH BEACON ORANGE TIMEOUT ST (SAFETY) ×3 IMPLANT
CUFF TOURN SGL QUICK 34 (TOURNIQUET CUFF) ×3
CUFF TRNQT CYL 34X4X40X1 (TOURNIQUET CUFF) ×1 IMPLANT
DECANTER SPIKE VIAL GLASS SM (MISCELLANEOUS) ×3 IMPLANT
DRAPE U-SHAPE 47X51 STRL (DRAPES) ×3 IMPLANT
DRSG ADAPTIC 3X8 NADH LF (GAUZE/BANDAGES/DRESSINGS) ×3 IMPLANT
DRSG PAD ABDOMINAL 8X10 ST (GAUZE/BANDAGES/DRESSINGS) ×1 IMPLANT
DURAPREP 26ML APPLICATOR (WOUND CARE) ×3 IMPLANT
ELECT REM PT RETURN 9FT ADLT (ELECTROSURGICAL) ×3
ELECTRODE REM PT RTRN 9FT ADLT (ELECTROSURGICAL) ×1 IMPLANT
EVACUATOR 1/8 PVC DRAIN (DRAIN) ×3 IMPLANT
GAUZE SPONGE 4X4 12PLY STRL (GAUZE/BANDAGES/DRESSINGS) ×1 IMPLANT
GLOVE BIO SURGEON STRL SZ7.5 (GLOVE) IMPLANT
GLOVE BIO SURGEON STRL SZ8 (GLOVE) ×3 IMPLANT
GLOVE BIOGEL PI IND STRL 6.5 (GLOVE) IMPLANT
GLOVE BIOGEL PI IND STRL 8 (GLOVE) ×1 IMPLANT
GLOVE BIOGEL PI INDICATOR 6.5 (GLOVE)
GLOVE BIOGEL PI INDICATOR 8 (GLOVE) ×2
GLOVE SURG SS PI 6.5 STRL IVOR (GLOVE) IMPLANT
GOWN STRL REUS W/TWL LRG LVL3 (GOWN DISPOSABLE) ×3 IMPLANT
GOWN STRL REUS W/TWL XL LVL3 (GOWN DISPOSABLE) IMPLANT
HANDPIECE INTERPULSE COAX TIP (DISPOSABLE) ×3
IMMOBILIZER KNEE 20 (SOFTGOODS) ×3
IMMOBILIZER KNEE 20 THIGH 36 (SOFTGOODS) ×1 IMPLANT
MANIFOLD NEPTUNE II (INSTRUMENTS) ×3 IMPLANT
NS IRRIG 1000ML POUR BTL (IV SOLUTION) ×3 IMPLANT
PACK TOTAL KNEE CUSTOM (KITS) ×3 IMPLANT
PAD ABD 8X10 STRL (GAUZE/BANDAGES/DRESSINGS) ×2 IMPLANT
PADDING CAST COTTON 6X4 STRL (CAST SUPPLIES) ×5 IMPLANT
POSITIONER SURGICAL ARM (MISCELLANEOUS) ×3 IMPLANT
SET HNDPC FAN SPRY TIP SCT (DISPOSABLE) ×1 IMPLANT
STRIP CLOSURE SKIN 1/2X4 (GAUZE/BANDAGES/DRESSINGS) ×3 IMPLANT
SUT MNCRL AB 4-0 PS2 18 (SUTURE) ×3 IMPLANT
SUT VIC AB 2-0 CT1 27 (SUTURE) ×9
SUT VIC AB 2-0 CT1 TAPERPNT 27 (SUTURE) ×3 IMPLANT
SUT VLOC 180 0 24IN GS25 (SUTURE) ×3 IMPLANT
SYR 50ML LL SCALE MARK (SYRINGE) ×3 IMPLANT
TRAY FOLEY W/METER SILVER 14FR (SET/KITS/TRAYS/PACK) ×1 IMPLANT
TRAY FOLEY W/METER SILVER 16FR (SET/KITS/TRAYS/PACK) ×3 IMPLANT
WATER STERILE IRR 1500ML POUR (IV SOLUTION) ×3 IMPLANT
WRAP KNEE MAXI GEL POST OP (GAUZE/BANDAGES/DRESSINGS) ×3 IMPLANT
YANKAUER SUCT BULB TIP 10FT TU (MISCELLANEOUS) ×3 IMPLANT

## 2015-04-14 NOTE — Anesthesia Procedure Notes (Signed)
Spinal Patient location during procedure: OR End time: 04/14/2015 1:53 PM Staffing Resident/CRNA: Noralyn Pick D Performed by: anesthesiologist and resident/CRNA  Preanesthetic Checklist Completed: patient identified, site marked, surgical consent, pre-op evaluation, timeout performed, IV checked, risks and benefits discussed and monitors and equipment checked Spinal Block Patient position: sitting Prep: Betadine Patient monitoring: heart rate, continuous pulse ox and blood pressure Approach: midline Location: L3-4 Injection technique: single-shot Needle Needle type: Sprotte  Needle gauge: 24 G Needle length: 9 cm Assessment Sensory level: T6 Additional Notes Expiration date of kit checked and confirmed. Patient tolerated procedure well, without complications.

## 2015-04-14 NOTE — Progress Notes (Signed)
Utilization review completed.  

## 2015-04-14 NOTE — Transfer of Care (Signed)
Immediate Anesthesia Transfer of Care Note  Patient: Justin Sutton  Procedure(s) Performed: Procedure(s): RIGHT TOTAL KNEE ARTHROPLASTY (Right)  Patient Location: PACU  Anesthesia Type:Spinal  Level of Consciousness: awake, alert  and oriented  Airway & Oxygen Therapy: Patient Spontanous Breathing and Patient connected to face mask oxygen  Post-op Assessment: Report given to RN and Post -op Vital signs reviewed and stable  Post vital signs: Reviewed and stable  Last Vitals:  Filed Vitals:   04/14/15 1037  BP: 169/107  Pulse: 88  Temp: 36.5 C  Resp: 18    Complications: No apparent anesthesia complications

## 2015-04-14 NOTE — Anesthesia Preprocedure Evaluation (Signed)
Anesthesia Evaluation  Patient identified by MRN, date of birth, ID band Patient awake    Reviewed: Allergy & Precautions, H&P , Patient's Chart, lab work & pertinent test results  Airway Mallampati: II  TM Distance: >3 FB Neck ROM: full    Dental no notable dental hx.    Pulmonary former smoker,    Pulmonary exam normal breath sounds clear to auscultation       Cardiovascular Exercise Tolerance: Good hypertension,  Rhythm:regular Rate:Normal     Neuro/Psych    GI/Hepatic   Endo/Other    Renal/GU      Musculoskeletal   Abdominal   Peds  Hematology   Anesthesia Other Findings Hypertension    Hyperlipidemia    OSA (obstructive sleep apnea)   Essential hypertension  Thoracic aortic aneurysm Heart murmur ; Aortic stenosis with 65mm Gradient  Chronic cough    GERD (gastroesophageal reflux disease)    Arthritis     Numbness         Reproductive/Obstetrics                             Anesthesia Physical Anesthesia Plan  ASA: II  Anesthesia Plan: Spinal   Post-op Pain Management:    Induction:   Airway Management Planned:   Additional Equipment:   Intra-op Plan:   Post-operative Plan:   Informed Consent: I have reviewed the patients History and Physical, chart, labs and discussed the procedure including the risks, benefits and alternatives for the proposed anesthesia with the patient or authorized representative who has indicated his/her understanding and acceptance.   Dental Advisory Given  Plan Discussed with: CRNA  Anesthesia Plan Comments: (Lab work confirmed with CRNA in room. Platelets okay. Discussed spinal anesthetic, and patient consents to the procedure:  included risk of possible headache,backache, failed block, allergic reaction, and nerve injury. This patient was asked if she had any questions or concerns before the procedure started. )         Anesthesia Quick Evaluation

## 2015-04-14 NOTE — Interval H&P Note (Signed)
History and Physical Interval Note:  04/14/2015 1:41 PM  [name]  has presented today for surgery, with the diagnosis of RIGHT KNEE OA  The various methods of treatment have been discussed with the patient and family. After consideration of risks, benefits and other options for treatment, the patient has consented to  Procedure(s): RIGHT TOTAL KNEE ARTHROPLASTY (Right) as a surgical intervention .  The patient's history has been reviewed, patient examined, no change in status, stable for surgery.  I have reviewed the patient's chart and labs.  Questions were answered to the patient's satisfaction.     Justin Sutton

## 2015-04-14 NOTE — H&P (View-Only) (Signed)
Justin Sutton DOB: [birth year] Married / Language: English / Race: White Male Date of Admission:  04/14/2015 CC:  Right Knee Pain History of Present Illness  The patient is a 70 year old male who comes in for a preoperative History and Physical. The patient is scheduled for a right total knee arthroplasty to be performed by [name]. [name], MD at [name] on 04-14-2015. The patient reports right knee (medial side pain) symptoms including: pain, swelling, instability (right knee causes pt. to fall a lot.), catching, giving way and popping which began 5 year(s) ago without any known injury. The patient describes the severity of the symptoms as moderate in severity. The patient describes their pain as aching.The patient feels that the symptoms are worsening. Previous work-up for this problem has included knee x-rays. This problem has not been previously treated. The patient reports that symptoms radiate to the right lower leg. Current treatment includes use of a walker ((cane)) and nonsteroidal anti-inflammatory drugs (aleve). This 70 year old gentleman has had problems with his knee for years. He has had a total knee done on the left by Dr. [name] a couple of years ago. He states he has had cortisone injections, viscosupplementation, all of which have not helped very much. Due to his continued pain and discomfort, he comes in to see about total knee arthroplasty on his right knee. He has got bone-on-bone arthritis in the medial and patellofemoral compartments of the right knee. He has got a rotating platform knee on the left in good position with no abnormalities. At this point, the most predictable means of improving pain and function is total knee arthroplasty. He has got advanced arthritic changes in the right knee. They have been treated conservatively in the past for the above stated problem and despite conservative measures, they continue to have progressive pain and severe functional  limitations and dysfunction. They have failed non-operative management including home exercise, medications, and injections. It is felt that they would benefit from undergoing total joint replacement. Risks and benefits of the procedure have been discussed with the patient and they elect to proceed with surgery. There are no active contraindications to surgery such as ongoing infection or rapidly progressive neurological disease.  Problem List/Past Medical  Status post total left knee replacement [id number])  Chronic Ulcerative Colitis  Gastroesophageal Reflux Disease  High blood pressure  Hypercholesterolemia  Localized primary osteoarthritis of right knee Impaired Hearing  bilateral hearing aids Heart murmur  Inguinal Hernia  PAD (Peripheral Artery Disease)  Aortic Valve Sclerosis  Allergies No Known Drug Allergies  Family History Father  Deceased. age 4 Mother  Deceased. age 4  Social History  Not under pain contract  No history of drug/alcohol rehab  Number of flights of stairs before winded  2-3 Tobacco use  Former smoker. 01/01/2015: smoke(d) 3 more pack(s) per day Tobacco / smoke exposure  01/01/2015: no Marital status  married Current drinker  01/01/2015: Currently drinks beer 5-7 times per week, 3-4 beers a day Children  3 Current work status  retired Living situation  live with spouse Exercise  Exercises daily; does running / walking and gym / [employer] Advance Directives  Living Will, Healthcare POA [name] following the Right TKA on 04/14/2015  Medication History  RaNITidine HCl (150MG  Capsule, Oral daily) Active. AmLODIPine Besylate (2.5MG  Tablet, Oral daily) Active. Aspirin (81MG  Tablet DR, Oral daily) Active. Lipitor (10MG  Tablet, Oral) Active. Aleve (220MG  Capsule, 1 (one) Oral) Active. Multiple Vitamin (1 (one) Oral)  Active.  Past Surgical History Cataract Surgery  bilateral Colon Polyp Removal - Colonoscopy   Inguinal Hernia Repair  laparoscopic: right Total Knee Replacement  left    Review of System General Not Present- Chills, Fatigue, Fever, Memory Loss, Night Sweats, Weight Gain and Weight Loss. Skin Not Present- Eczema, Hives, Itching, Lesions and Rash. HEENT Present- Tinnitus. Not Present- Dentures, Double Vision, Headache, Hearing Loss and Visual Loss. Respiratory Present- Chronic Cough (attribute to blood pressure medication). Not Present- Allergies, Coughing up blood, Shortness of breath at rest and Shortness of breath with exertion. Cardiovascular Not Present- Chest Pain, Difficulty Breathing Lying Down, Murmur, Palpitations, Racing/skipping heartbeats and Swelling. Gastrointestinal Not Present- Abdominal Pain, Bloody Stool, Constipation, Diarrhea, Difficulty Swallowing, Heartburn, Jaundice, Loss of appetitie, Nausea and Vomiting. Male Genitourinary Not Present- Blood in Urine, Discharge, Flank Pain, Incontinence, Painful Urination, Urgency, Urinary frequency, Urinary Retention, Urinating at Night and Weak urinary stream. Musculoskeletal Present- Joint Pain. Not Present- Back Pain, Joint Swelling, Morning Stiffness, Muscle Pain, Muscle Weakness and Spasms. Neurological Not Present- Blackout spells, Difficulty with balance, Dizziness, Paralysis, Tremor and Weakness. Psychiatric Not Present- Insomnia.  Vitals Weight: 71 lb Height: 180in Weight was reported by patient. Height was reported by patient. Body Surface Area: 2.67 m Body Mass Index: 1.54 kg/m  BP: 154/100 (Sitting, Left Arm, Standard)  Physical Exam General Mental Status -Alert, cooperative and good historian. General Appearance-pleasant, Not in acute distress. Orientation-Oriented X3. Build & Nutrition-Well nourished and Well developed.  Head and Neck Head-normocephalic, atraumatic . Neck Global Assessment - supple, no bruit auscultated on the right, no bruit auscultated on the  left.  Eye Pupil - Bilateral-Regular and Round. Motion - Bilateral-EOMI.  ENMT Note: upper and lower dentures   Chest and Lung Exam Auscultation Breath sounds - clear at anterior chest wall and clear at posterior chest wall. Adventitious sounds - No Adventitious sounds.  Cardiovascular Auscultation Rhythm - Regular rate and rhythm. Heart Sounds - S1 WNL and S2 WNL. Murmurs & Other Heart Sounds: Murmur 1 - Location - Aortic Area and Sternal Border - Left. Timing - Mid-systolic. Grade - III/VI. Character - Crescendo/Decrescendo.  Abdomen Palpation/Percussion Tenderness - Abdomen is non-tender to palpation. Rigidity (guarding) - Abdomen is soft. Auscultation Auscultation of the abdomen reveals - Bowel sounds normal.  Male Genitourinary Note: Not done, not pertinent to present illness   Musculoskeletal Note: On exam, he is in no distress. His right knee shows no effusion. Range is about 5 to 125. There is marked crepitus on range of motion with tenderness medial greater than lateral with no instability noted. Pulse, sensation and motor are intact.  RADIOGRAPHS Radiographs are reviewed AP both knees and lateral. He has got bone-on-bone arthritis in the medial and patellofemoral compartments of the right knee. He has got a rotating platform knee on the left in good position with no abnormalities.  Assessment & Plan Primary osteoarthritis of right knee (M17.11) Status post total left knee replacement [id number])  Note:Surgical Plans: Right Total Knee Replacement  Disposition: Home  PCP: Dr. Dorthy Cooler - Patient has been seen preoperatively and felt to be stable for surgery.  Topical TXA - PAD  Anesthesia Issues: None  Signed electronically by Joelene Millin, III PA-C

## 2015-04-14 NOTE — Op Note (Signed)
Pre-operative diagnosis- Osteoarthritis  Right knee(s)  Post-operative diagnosis- Osteoarthritis Right knee(s)  Procedure-  Right  Total Knee Arthroplasty  Surgeon- Dione Plover. Somya Jauregui, MD  Assistant- Arlee Muslim, PA-C   Anesthesia-  Spinal  EBL-* No blood loss amount entered *   Drains Hemovac  Tourniquet time-  Total Tourniquet Time Documented: Thigh (Right) - 37 minutes Total: Thigh (Right) - 37 minutes     Complications- None  Condition-PACU - hemodynamically stable.   Brief Clinical Note  Justin Sutton is a 70 y.o. year old male with end stage OA of his right knee with progressively worsening pain and dysfunction. He has constant pain, with activity and at rest and significant functional deficits with difficulties even with ADLs. He has had extensive non-op management including analgesics, injections of cortisone, and home exercise program, but remains in significant pain with significant dysfunction. Radiographs show bone on bone arthritis medial and patellofemoral. He presents now for right Total Knee Arthroplasty.    Procedure in detail---   The patient is brought into the operating room and positioned supine on the operating table. After successful administration of  Spinal,   a tourniquet is placed high on the  Right thigh(s) and the lower extremity is prepped and draped in the usual sterile fashion. Time out is performed by the operating team and then the  Right lower extremity is wrapped in Esmarch, knee flexed and the tourniquet inflated to 300 mmHg.       A midline incision is made with a ten blade through the subcutaneous tissue to the level of the extensor mechanism. A fresh blade is used to make a medial parapatellar arthrotomy. Soft tissue over the proximal medial tibia is subperiosteally elevated to the joint line with a knife and into the semimembranosus bursa with a Cobb elevator. Soft tissue over the proximal lateral tibia is elevated with attention being paid to  avoiding the patellar tendon on the tibial tubercle. The patella is everted, knee flexed 90 degrees and the ACL and PCL are removed. Findings are bone on bone medial and patellofemoral with massive global osteophytes.        The drill is used to create a starting hole in the distal femur and the canal is thoroughly irrigated with sterile saline to remove the fatty contents. The 5 degree Right  valgus alignment guide is placed into the femoral canal and the distal femoral cutting block is pinned to remove 10 mm off the distal femur. Resection is made with an oscillating saw.      The tibia is subluxed forward and the menisci are removed. The extramedullary alignment guide is placed referencing proximally at the medial aspect of the tibial tubercle and distally along the second metatarsal axis and tibial crest. The block is pinned to remove 22mm off the more deficient medial  side. Resection is made with an oscillating saw. Size 5is the most appropriate size for the tibia and the proximal tibia is prepared with the modular drill and keel punch for that size.      The femoral sizing guide is placed and size 4 is most appropriate. Rotation is marked off the epicondylar axis and confirmed by creating a rectangular flexion gap at 90 degrees. The size 4 cutting block is pinned in this rotation and the anterior, posterior and chamfer cuts are made with the oscillating saw. The intercondylar block is then placed and that cut is made.      Trial size 5 tibial component, trial size  4 posterior stabilized femur and a 12.5  mm posterior stabilized rotating platform insert trial is placed. Full extension is achieved with excellent varus/valgus and anterior/posterior balance throughout full range of motion. The patella is everted and thickness measured to be 27  mm. Free hand resection is taken to 15 mm, a 41 template is placed, lug holes are drilled, trial patella is placed, and it tracks normally. Osteophytes are removed off  the posterior femur with the trial in place. All trials are removed and the cut bone surfaces prepared with pulsatile lavage. Cement is mixed and once ready for implantation, the size 5 tibial implant, size  4 posterior stabilized femoral component, and the size 41 patella are cemented in place and the patella is held with the clamp. The trial insert is placed and the knee held in full extension. The Exparel (20 ml mixed with 30 ml saline) and .25% Bupivicaine, are injected into the extensor mechanism, posterior capsule, medial and lateral gutters and subcutaneous tissues.  All extruded cement is removed and once the cement is hard the permanent 12.5 mm posterior stabilized rotating platform insert is placed into the tibial tray.      The wound is copiously irrigated with saline solution and the extensor mechanism closed over a hemovac drain with #1 V-loc suture. The tourniquet is released for a total tourniquet time of 37  minutes. Flexion against gravity is 140 degrees and the patella tracks normally. Subcutaneous tissue is closed with 2.0 vicryl and subcuticular with running 4.0 Monocryl. The incision is cleaned and dried and steri-strips and a bulky sterile dressing are applied. The limb is placed into a knee immobilizer and the patient is awakened and transported to recovery in stable condition.      Please note that a surgical assistant was a medical necessity for this procedure in order to perform it in a safe and expeditious manner. Surgical assistant was necessary to retract the ligaments and vital neurovascular structures to prevent injury to them and also necessary for proper positioning of the limb to allow for anatomic placement of the prosthesis.   Dione Plover Irineo Gaulin, MD    04/14/2015, 2:54 PM

## 2015-04-15 ENCOUNTER — Encounter (HOSPITAL_COMMUNITY): Payer: Self-pay | Admitting: Orthopedic Surgery

## 2015-04-15 LAB — CBC
HEMATOCRIT: 36 % — AB (ref 39.0–52.0)
HEMOGLOBIN: 12 g/dL — AB (ref 13.0–17.0)
MCH: 31 pg (ref 26.0–34.0)
MCHC: 33.3 g/dL (ref 30.0–36.0)
MCV: 93 fL (ref 78.0–100.0)
Platelets: 277 10*3/uL (ref 150–400)
RBC: 3.87 MIL/uL — AB (ref 4.22–5.81)
RDW: 12.6 % (ref 11.5–15.5)
WBC: 12.9 10*3/uL — AB (ref 4.0–10.5)

## 2015-04-15 LAB — BASIC METABOLIC PANEL
ANION GAP: 9 (ref 5–15)
BUN: 9 mg/dL (ref 6–20)
CHLORIDE: 104 mmol/L (ref 101–111)
CO2: 24 mmol/L (ref 22–32)
Calcium: 8.6 mg/dL — ABNORMAL LOW (ref 8.9–10.3)
Creatinine, Ser: 0.68 mg/dL (ref 0.61–1.24)
GFR calc non Af Amer: >60 mL/min (ref >60)
Glucose, Bld: 136 mg/dL — ABNORMAL HIGH (ref 65–99)
POTASSIUM: 3.8 mmol/L (ref 3.5–5.1)
SODIUM: 137 mmol/L (ref 135–145)

## 2015-04-15 MED ORDER — RIVAROXABAN 10 MG PO TABS: 10.0000 mg | ORAL_TABLET | Freq: Every day | ORAL | Status: DC

## 2015-04-15 MED ORDER — OXYCODONE HCL 5 MG PO TABS: 5.0000 mg | ORAL_TABLET | ORAL | Status: DC | PRN

## 2015-04-15 MED ORDER — TRAMADOL HCL 50 MG PO TABS: 50.0000 mg | ORAL_TABLET | Freq: Four times a day (QID) | ORAL | Status: DC | PRN

## 2015-04-15 MED ORDER — METHOCARBAMOL 500 MG PO TABS: 500.0000 mg | ORAL_TABLET | Freq: Four times a day (QID) | ORAL | Status: DC | PRN

## 2015-04-15 NOTE — Evaluation (Signed)
Physical Therapy Evaluation Patient Details Name: Justin Sutton MRN: [id number] DOB: [birth year] Today's Date: 04/15/2015   History of Present Illness  RTKA  Clinical Impression  Patient is doing well. Ready for DC today. All education completed.    Follow Up Recommendations Home health PT;Supervision/Assistance - 24 hour    Equipment Recommendations  None recommended by PT    Recommendations for Other Services       Precautions / Restrictions Precautions Precautions: Fall Required Braces or Orthoses: Knee Immobilizer - Right Knee Immobilizer - Right: Discontinue once straight leg raise with < 10 degree lag Restrictions Weight Bearing Restrictions: No      Mobility  Bed Mobility Overal bed mobility: Modified Independent                Transfers Overall transfer level: Modified independent Equipment used: Rolling walker (2 wheeled)                Ambulation/Gait Ambulation/Gait assistance: Supervision Ambulation Distance (Feet): 300 Feet Assistive device: Rolling walker (2 wheeled) Gait Pattern/deviations: Step-to pattern;Step-through pattern;Antalgic     General Gait Details: cues for safety  Stairs Stairs: Yes Stairs assistance: Min guard Stair Management: Step to pattern;Forwards;With cane;One rail Left Number of Stairs: 2 (x 2) General stair comments: cues for safety. wanted to step with r leg, cues for safety  Wheelchair Mobility    Modified Rankin (Stroke Patients Only)       Balance                                             Pertinent Vitals/Pain Pain Assessment: 0-10 Pain Score: 2  Pain Location: R knee Pain Descriptors / Indicators: Aching Pain Intervention(s): Premedicated before session;Repositioned    Home Living Family/patient expects to be discharged to:: Private residence Living Arrangements: Spouse/significant other Available Help at Discharge: Family Type of Home: House Home Access: Stairs  to enter Entrance Stairs-Rails: Right Entrance Stairs-Number of Steps: 3   Home Equipment: Environmental consultant - 2 wheels;Cane - single point;Crutches;Shower seat - built in      Prior Function Level of Independence: Independent with assistive device(s)               Hand Dominance        Extremity/Trunk Assessment   Upper Extremity Assessment: Overall WFL for tasks assessed           Lower Extremity Assessment: RLE deficits/detail RLE Deficits / Details: 15-50  r knee flexion    Cervical / Trunk Assessment: Normal  Communication   Communication: No difficulties  Cognition Arousal/Alertness: Awake/alert Behavior During Therapy: WFL for tasks assessed/performed Overall Cognitive Status: Within Functional Limits for tasks assessed                      General Comments      Exercises Total Joint Exercises Ankle Circles/Pumps: AROM;Both;10 reps Quad Sets: AROM;Both;10 reps Short Arc QuadSinclair Ship;Right;5 reps Heel Slides: AAROM;Right;5 reps Hip ABduction/ADduction: AROM;Right;10 reps Straight Leg Raises: AROM;Right;10 reps      Assessment/Plan    PT Assessment All further PT needs can be met in the next venue of care  PT Diagnosis Difficulty walking;Acute pain   PT Problem List Decreased strength;Decreased range of motion;Decreased activity tolerance;Decreased knowledge of use of DME;Decreased safety awareness;Decreased knowledge of precautions;Pain  PT Treatment Interventions     PT Goals (Current goals  can be found in the Care Plan section) Acute Rehab PT Goals Patient Stated Goal: to go home PT Goal Formulation: All assessment and education complete, DC therapy    Frequency     Barriers to discharge        Co-evaluation               End of Session Equipment Utilized During Treatment: Right knee immobilizer Activity Tolerance: Patient tolerated treatment well Patient left: in chair;with call bell/phone within reach;with family/visitor  present Nurse Communication: Mobility status         Time: 2119-4174 PT Time Calculation (min) (ACUTE ONLY): 40 min   Charges:   PT Evaluation $PT Eval Low Complexity: 1 Procedure PT Treatments $Gait Training: 8-22 mins $Therapeutic Exercise: 8-22 mins   PT G Codes:        [name] 04/15/2015, 11:44 AM

## 2015-04-15 NOTE — Anesthesia Postprocedure Evaluation (Signed)
Anesthesia Post Note  Patient: Justin Sutton  Procedure(s) Performed: Procedure(s) (LRB): RIGHT TOTAL KNEE ARTHROPLASTY (Right)  Patient location during evaluation: PACU Anesthesia Type: Spinal Level of consciousness: awake Pain management: satisfactory to patient Vital Signs Assessment: post-procedure vital signs reviewed and stable Respiratory status: spontaneous breathing Cardiovascular status: blood pressure returned to baseline Postop Assessment: no headache and spinal receding Anesthetic complications: no    Last Vitals:  Filed Vitals:   04/15/15 0646 04/15/15 0945  BP: 172/90 172/90  Pulse: 86   Temp: 36.4 C   Resp: 18     Last Pain:  Filed Vitals:   04/15/15 0947  PainSc: 3                  Gatha Mcnulty EDWARD

## 2015-04-15 NOTE — Discharge Summary (Signed)
Physician Discharge Summary   Patient ID: Justin Sutton MRN: [id number] DOB/AGE: 12-07-1945 70 y.o.  Admit date: 04/14/2015 Discharge date: 04-15-2015  Primary Diagnosis:  Osteoarthritis Right knee(s)  Admission Diagnoses:  Past Medical History  Diagnosis Date  . H/O asbestos exposure   . Hypertension   . Hyperlipidemia   . OSA (obstructive sleep apnea)   . Hearing loss   . Essential hypertension 02/14/2015  . Thoracic aortic aneurysm without rupture ([place]) 02/14/2015    4.1 cm 01/2015  . Heart murmur   . Chronic cough   . Nocturia   . GERD (gastroesophageal reflux disease)   . Arthritis   . Numbness     comes and goes per right hand    Discharge Diagnoses:   Principal Problem:   OA (osteoarthritis) of knee  Estimated body mass index is 25.81 kg/(m^2) as calculated from the following:   Height as of this encounter: 5' 11"  (1.803 m).   Weight as of this encounter: 83.915 kg (185 lb).  Procedure:  Procedure(s) (LRB): RIGHT TOTAL KNEE ARTHROPLASTY (Right)   Consults: None  HPI: Justin Sutton is a 70 y.o. year old male with end stage OA of his right knee with progressively worsening pain and dysfunction. He has constant pain, with activity and at rest and significant functional deficits with difficulties even with ADLs. He has had extensive non-op management including analgesics, injections of cortisone, and home exercise program, but remains in significant pain with significant dysfunction. Radiographs show bone on bone arthritis medial and patellofemoral. He presents now for right Total Knee Arthroplasty.   Laboratory Data: Admission on 04/14/2015  Component Date Value Ref Range Status  . WBC 04/15/2015 12.9* 4.0 - 10.5 K/uL Final  . RBC 04/15/2015 3.87* 4.22 - 5.81 MIL/uL Final  . Hemoglobin 04/15/2015 12.0* 13.0 - 17.0 g/dL Final  . HCT 04/15/2015 36.0* 39.0 - 52.0 % Final  . MCV 04/15/2015 93.0  78.0 - 100.0 fL Final  . MCH 04/15/2015 31.0  26.0 - 34.0 pg  Final  . MCHC 04/15/2015 33.3  30.0 - 36.0 g/dL Final  . RDW 04/15/2015 12.6  11.5 - 15.5 % Final  . Platelets 04/15/2015 277  150 - 400 K/uL Final  . Sodium 04/15/2015 137  135 - 145 mmol/L Final  . Potassium 04/15/2015 3.8  3.5 - 5.1 mmol/L Final  . Chloride 04/15/2015 104  101 - 111 mmol/L Final  . CO2 04/15/2015 24  22 - 32 mmol/L Final  . Glucose, Bld 04/15/2015 136* 65 - 99 mg/dL Final  . BUN 04/15/2015 9  6 - 20 mg/dL Final  . Creatinine, Ser 04/15/2015 0.68  0.61 - 1.24 mg/dL Final  . Calcium 04/15/2015 8.6* 8.9 - 10.3 mg/dL Final  . GFR calc non Af Amer 04/15/2015 >60  >60 mL/min Final  . GFR calc Af Amer 04/15/2015 >60  >60 mL/min Final   Comment: (NOTE) The eGFR has been calculated using the CKD EPI equation. This calculation has not been validated in all clinical situations. eGFR's persistently <60 mL/min signify possible Chronic Kidney Disease.   Georgiann Hahn gap 04/15/2015 9  5 - 15 Final  Hospital Outpatient Visit on 04/08/2015  Component Date Value Ref Range Status  . aPTT 04/08/2015 30  24 - 37 seconds Final  . WBC 04/08/2015 7.6  4.0 - 10.5 K/uL Final  . RBC 04/08/2015 4.62  4.22 - 5.81 MIL/uL Final  . Hemoglobin 04/08/2015 14.3  13.0 - 17.0 g/dL Final  . HCT  04/08/2015 43.5  39.0 - 52.0 % Final  . MCV 04/08/2015 94.2  78.0 - 100.0 fL Final  . MCH 04/08/2015 31.0  26.0 - 34.0 pg Final  . MCHC 04/08/2015 32.9  30.0 - 36.0 g/dL Final  . RDW 04/08/2015 12.7  11.5 - 15.5 % Final  . Platelets 04/08/2015 323  150 - 400 K/uL Final  . Sodium 04/08/2015 140  135 - 145 mmol/L Final  . Potassium 04/08/2015 4.3  3.5 - 5.1 mmol/L Final  . Chloride 04/08/2015 105  101 - 111 mmol/L Final  . CO2 04/08/2015 27  22 - 32 mmol/L Final  . Glucose, Bld 04/08/2015 112* 65 - 99 mg/dL Final  . BUN 04/08/2015 13  6 - 20 mg/dL Final  . Creatinine, Ser 04/08/2015 0.91  0.61 - 1.24 mg/dL Final  . Calcium 04/08/2015 9.4  8.9 - 10.3 mg/dL Final  . Total Protein 04/08/2015 7.4  6.5 - 8.1 g/dL  Final  . Albumin 04/08/2015 4.6  3.5 - 5.0 g/dL Final  . AST 04/08/2015 24  15 - 41 U/L Final  . ALT 04/08/2015 21  17 - 63 U/L Final  . Alkaline Phosphatase 04/08/2015 70  38 - 126 U/L Final  . Total Bilirubin 04/08/2015 0.8  0.3 - 1.2 mg/dL Final  . GFR calc non Af Amer 04/08/2015 >60  >60 mL/min Final  . GFR calc Af Amer 04/08/2015 >60  >60 mL/min Final   Comment: (NOTE) The eGFR has been calculated using the CKD EPI equation. This calculation has not been validated in all clinical situations. eGFR's persistently <60 mL/min signify possible Chronic Kidney Disease.   . Anion gap 04/08/2015 8  5 - 15 Final  . Prothrombin Time 04/08/2015 12.8  11.6 - 15.2 seconds Final  . INR 04/08/2015 0.95  0.00 - 1.49 Final  . ABO/RH(D) 04/08/2015 A POS   Final  . Antibody Screen 04/08/2015 NEG   Final  . Sample Expiration 04/08/2015 04/17/2015   Final  . Extend sample reason 04/08/2015 NO TRANSFUSIONS OR PREGNANCY IN THE PAST 3 MONTHS   Final  . Color, Urine 04/08/2015 YELLOW  YELLOW Final  . APPearance 04/08/2015 CLEAR  CLEAR Final  . Specific Gravity, Urine 04/08/2015 1.009  1.005 - 1.030 Final  . pH 04/08/2015 7.5  5.0 - 8.0 Final  . Glucose, UA 04/08/2015 NEGATIVE  NEGATIVE mg/dL Final  . Hgb urine dipstick 04/08/2015 NEGATIVE  NEGATIVE Final  . Bilirubin Urine 04/08/2015 NEGATIVE  NEGATIVE Final  . Ketones, ur 04/08/2015 NEGATIVE  NEGATIVE mg/dL Final  . Protein, ur 04/08/2015 NEGATIVE  NEGATIVE mg/dL Final  . Nitrite 04/08/2015 NEGATIVE  NEGATIVE Final  . Leukocytes, UA 04/08/2015 NEGATIVE  NEGATIVE Final   MICROSCOPIC NOT DONE ON URINES WITH NEGATIVE PROTEIN, BLOOD, LEUKOCYTES, NITRITE, OR GLUCOSE <1000 mg/dL.  Marland Kitchen MRSA, PCR 04/08/2015 NEGATIVE  NEGATIVE Final  . Staphylococcus aureus 04/08/2015 NEGATIVE  NEGATIVE Final   Comment:        The Xpert SA Assay (FDA approved for NASAL specimens in patients over 46 years of age), is one component of a comprehensive surveillance program.   Test performance has been validated by [facility] for patients greater than or equal to 21 year old. It is not intended to diagnose infection nor to guide or monitor treatment.   . ABO/RH(D) 04/08/2015 A POS   Final     X-Rays:No results found.  EKG:No orders found for this or any previous visit.   Hospital Course: [name]  is a 70 y.o. who was admitted to [facility]. They were brought to the operating room on 04/14/2015 and underwent Procedure(s): RIGHT TOTAL KNEE ARTHROPLASTY.  Patient tolerated the procedure well and was later transferred to the recovery room and then to the orthopaedic floor for postoperative care.  They were given PO and IV analgesics for pain control following their surgery.  They were given 24 hours of postoperative antibiotics of  Anti-infectives    Start     Dose/Rate Route Frequency Ordered Stop   04/14/15 2000  ceFAZolin (ANCEF) IVPB 2 g/50 mL premix     2 g 100 mL/hr over 30 Minutes Intravenous Every 6 hours 04/14/15 1701 04/15/15 0311   04/14/15 1052  ceFAZolin (ANCEF) IVPB 2 g/50 mL premix     2 g 100 mL/hr over 30 Minutes Intravenous On call to O.R. 04/14/15 1052 04/14/15 1355     and started on DVT prophylaxis in the form of Xarelto.   PT and OT were ordered for total joint protocol.  Discharge planning consulted to help with postop disposition and equipment needs.  Patient had a good night on the evening of surgery. Hemovac drain was pulled without difficulty.  Dressing was checked and it was clean and dry.   Patient was seen in rounds by Dr. Maureen Ralphs and as long as he did well with therapy, he would be ready to go home.  Discharge home with home health Diet - Cardiac diet Follow up - in 2 weeks Activity - WBAT Disposition - Home Condition Upon Discharge - Good D/C Meds - See DC Summary DVT Prophylaxis - Xarelto  Discharge Instructions    Call MD / Call 911    Complete by:  As directed   If you experience chest pain or shortness  of breath, CALL 911 and be transported to the hospital emergency room.  If you develope a fever above 101 F, pus (white drainage) or increased drainage or redness at the wound, or calf pain, call your surgeon's office.     Change dressing    Complete by:  As directed   Change dressing daily with sterile 4 x 4 inch gauze dressing and apply TED hose. Do not submerge the incision under water.     Constipation Prevention    Complete by:  As directed   Drink plenty of fluids.  Prune juice may be helpful.  You may use a stool softener, such as Colace (over the counter) 100 mg twice a day.  Use MiraLax (over the counter) for constipation as needed.     Diet - low sodium heart healthy    Complete by:  As directed      Discharge instructions    Complete by:  As directed   Pick up stool softner and laxative for home use following surgery while on pain medications. Do not submerge incision under water. May remove the surgical dressing tomorrow, Wednesday 04/16/2015, and then apply a dry gauze dressing daily. Please use good hand washing techniques while changing dressing each day. May shower starting three days after surgery starting Thursday 04/17/2015. Please use a clean towel to pat the incision dry following showers. Continue to use ice for pain and swelling after surgery. Do not use any lotions or creams on the incision until instructed by your surgeon.  Postoperative Constipation Protocol  Constipation - defined medically as fewer than three stools per week and severe constipation as less than one stool per week.  One of the most  common issues patients have following surgery is constipation. Even if you have a regular bowel pattern at home, your normal regimen is likely to be disrupted due to multiple reasons following surgery. Combination of anesthesia, postoperative narcotics, change in appetite and fluid intake all can affect your bowels. In order to avoid complications following surgery, here  are some recommendations in order to help you during your recovery period.  Colace (docusate) - Pick up an over-the-counter form of Colace or another stool softener and take twice a day as long as you are requiring postoperative pain medications. Take with a full glass of water daily. If you experience loose stools or diarrhea, hold the colace until you stool forms back up. If your symptoms do not get better within 1 week or if they get worse, check with your doctor.  Dulcolax (bisacodyl) - Pick up over-the-counter and take as directed by the product packaging as needed to assist with the movement of your bowels. Take with a full glass of water. Use this product as needed if not relieved by Colace only.   MiraLax (polyethylene glycol) - Pick up over-the-counter to have on hand. MiraLax is a solution that will increase the amount of water in your bowels to assist with bowel movements. Take as directed and can mix with a glass of water, juice, soda, coffee, or tea. Take if you go more than two days without a movement. Do not use MiraLax more than once per day. Call your doctor if you are still constipated or irregular after using this medication for 7 days in a row.  If you continue to have problems with postoperative constipation, please contact the office for further assistance and recommendations. If you experience "the worst abdominal pain ever" or develop nausea or vomiting, please contact the office immediatly for further recommendations for treatment.   Take Xarelto for three weeks, then discontinue Xarelto. Once the patient has completed the Xarelto, they may resume the 81 mg Aspirin.     Do not put a pillow under the knee. Place it under the heel.    Complete by:  As directed      Do not sit on low chairs, stoools or toilet seats, as it may be difficult to get up from low surfaces    Complete by:  As directed      Driving restrictions    Complete by:  As directed   No driving  until released by the physician.     Increase activity slowly as tolerated    Complete by:  As directed      Lifting restrictions    Complete by:  As directed   No lifting until released by the physician.     Patient may shower    Complete by:  As directed   You may shower without a dressing once there is no drainage.  Do not wash over the wound.  If drainage remains, do not shower until drainage stops.     TED hose    Complete by:  As directed   Use stockings (TED hose) for 3 weeks on both leg(s).  You may remove them at night for sleeping.     Weight bearing as tolerated    Complete by:  As directed   Laterality:  right  Extremity:  Lower            Medication List    STOP taking these medications        aspirin 81 MG tablet  HYDROcodone-acetaminophen 5-325 MG tablet  Commonly known as:  NORCO/VICODIN     multivitamin capsule      TAKE these medications        amLODipine 2.5 MG tablet  Commonly known as:  NORVASC  Take 2.5 mg by mouth daily.     atorvastatin 40 MG tablet  Commonly known as:  LIPITOR  Take 40 mg by mouth daily.     methocarbamol 500 MG tablet  Commonly known as:  ROBAXIN  Take 1 tablet (500 mg total) by mouth every 6 (six) hours as needed for muscle spasms.     oxyCODONE 5 MG immediate release tablet  Commonly known as:  Oxy IR/ROXICODONE  Take 1-2 tablets (5-10 mg total) by mouth every 3 (three) hours as needed for moderate pain or severe pain.     ranitidine 150 MG capsule  Commonly known as:  ZANTAC  Use up to every 12 h as needed for heartburn     rivaroxaban 10 MG Tabs tablet  Commonly known as:  XARELTO  Take 1 tablet (10 mg total) by mouth daily with breakfast. Take Xarelto for two and a half more weeks, then discontinue Xarelto. Once the patient has completed the Xarelto, they may resume the 81 mg Aspirin.     traMADol 50 MG tablet  Commonly known as:  ULTRAM  Take 1-2 tablets (50-100 mg total) by mouth every 6 (six) hours as  needed (mild pain).           Follow-up Information    Follow up with Gearlean Alf, MD. Schedule an appointment as soon as possible for a visit on 04/29/2015.   Specialty:  Orthopedic Surgery   Why:  Call office at [phone] to setup appointment in two weeks with Dr. Wynelle Link on Tuesday 04/29/2015.   Contact information:   [address] [place] [address] [phone]       Signed: Arlee Muslim, PA-C Orthopaedic Surgery 04/15/2015, 8:51 AM

## 2015-04-15 NOTE — Progress Notes (Signed)
   Subjective: 1 Day Post-Op Procedure(s) (LRB): RIGHT TOTAL KNEE ARTHROPLASTY (Right) Patient reports pain as mild.   Patient seen in rounds with Dr. Wynelle Link. Patient is well, but has had some minor complaints of pain in the knee, requiring pain medications We will start therapy today.  If they do well with therapy and meets all goals, then will allow home later this afternoon following therapy. Plan is to go Home after hospital stay.  Objective: Vital signs in last 24 hours: Temp:  [97.3 F (36.3 C)-97.9 F (36.6 C)] 97.5 F (36.4 C) (01/10 0646) Pulse Rate:  [61-97] 86 (01/10 0646) Resp:  [13-20] 18 (01/10 0646) BP: (122-175)/(86-112) 172/90 mmHg (01/10 0646) SpO2:  [98 %-100 %] 98 % (01/10 0646) Weight:  [83.915 kg (185 lb)-86.354 kg (190 lb 6 oz)] 83.915 kg (185 lb) (01/09 1651)  Intake/Output from previous day:  Intake/Output Summary (Last 24 hours) at 04/15/15 0728 Last data filed at 04/15/15 0529  Gross per 24 hour  Intake 4491.66 ml  Output   3290 ml  Net 1201.66 ml   Labs:  Recent Labs  04/15/15 0510  HGB 12.0*    Recent Labs  04/15/15 0510  WBC 12.9*  RBC 3.87*  HCT 36.0*  PLT 277    Recent Labs  04/15/15 0510  NA 137  K 3.8  CL 104  CO2 24  BUN 9  CREATININE 0.68  GLUCOSE 136*  CALCIUM 8.6*   No results for input(s): LABPT, INR in the last 72 hours.  EXAM General - Patient is Alert, Appropriate and Oriented Extremity - Neurovascular intact Sensation intact distally Dorsiflexion/Plantar flexion intact Dressing - dressing C/D/I Motor Function - intact, moving foot and toes well on exam.  Hemovac pulled without difficulty.  Past Medical History  Diagnosis Date  . H/O asbestos exposure   . Hypertension   . Hyperlipidemia   . OSA (obstructive sleep apnea)   . Hearing loss   . Essential hypertension 02/14/2015  . Thoracic aortic aneurysm without rupture ([place]) 02/14/2015    4.1 cm 01/2015  . Heart murmur   . Chronic cough   .  Nocturia   . GERD (gastroesophageal reflux disease)   . Arthritis   . Numbness     comes and goes per right hand     Assessment/Plan: 1 Day Post-Op Procedure(s) (LRB): RIGHT TOTAL KNEE ARTHROPLASTY (Right) Principal Problem:   OA (osteoarthritis) of knee  Estimated body mass index is 25.81 kg/(m^2) as calculated from the following:   Height as of this encounter: 5\' 11"  (1.803 m).   Weight as of this encounter: 83.915 kg (185 lb). Advance diet Up with therapy Discharge home with home health  DVT Prophylaxis - Xarelto Weight-Bearing as tolerated to right leg D/C O2 and Pulse OX and try on Room Air  If meets goals and able to go home: Discharge home with home health Diet - Cardiac diet Follow up - in 2 weeks Activity - WBAT Disposition - Home Condition Upon Discharge - Good D/C Meds - See DC Summary DVT Prophylaxis - [place], PA-C Orthopaedic Surgery

## 2015-04-15 NOTE — Discharge Instructions (Signed)
° °Dr. Frank [name] °Total Joint Specialist °[facility] °[address] °[place], [place] [address] °[phone] ° °TOTAL KNEE REPLACEMENT POSTOPERATIVE DIRECTIONS ° °Knee Rehabilitation, Guidelines Following Surgery  °Results after knee surgery are often greatly improved when you follow the exercise, range of motion and muscle strengthening exercises prescribed by your doctor. Safety measures are also important to protect the knee from further injury. Any time any of these exercises cause you to have increased pain or swelling in your knee joint, decrease the amount until you are comfortable again and slowly increase them. If you have problems or questions, call your caregiver or physical therapist for advice.  ° °HOME CARE INSTRUCTIONS  °Remove items at home which could result in a fall. This includes throw rugs or furniture in walking pathways.  °· ICE to the affected knee every three hours for 30 minutes at a time and then as needed for pain and swelling.  Continue to use ice on the knee for pain and swelling from surgery. You may notice swelling that will progress down to the foot and ankle.  This is normal after surgery.  Elevate the leg when you are not up walking on it.   °· Continue to use the breathing machine which will help keep your temperature down.  It is common for your temperature to cycle up and down following surgery, especially at night when you are not up moving around and exerting yourself.  The breathing machine keeps your lungs expanded and your temperature down. °· Do not place pillow under knee, focus on keeping the knee straight while resting ° °DIET °You may resume your previous home diet once your are discharged from the hospital. ° °DRESSING / WOUND CARE / SHOWERING °You may shower 3 days after surgery, but keep the wounds dry during showering.  You may use an occlusive plastic wrap (Press'n Seal for example), NO SOAKING/SUBMERGING IN THE BATHTUB.  If the  bandage gets wet, change with a clean dry gauze.  If the incision gets wet, pat the wound dry with a clean towel. °You may start showering once you are discharged home but do not submerge the incision under water. Just pat the incision dry and apply a dry gauze dressing on daily. °Change the surgical dressing daily and reapply a dry dressing each time. ° °ACTIVITY °Walk with your walker as instructed. °Use walker as long as suggested by your caregivers. °Avoid periods of inactivity such as sitting longer than an hour when not asleep. This helps prevent blood clots.  °You may resume a sexual relationship in one month or when given the OK by your doctor.  °You may return to work once you are cleared by your doctor.  °Do not drive a car for 6 weeks or until released by you surgeon.  °Do not drive while taking narcotics. ° °WEIGHT BEARING °Weight bearing as tolerated with assist device (walker, cane, etc) as directed, use it as long as suggested by your surgeon or therapist, typically at least 4-6 weeks. ° °POSTOPERATIVE CONSTIPATION PROTOCOL °Constipation - defined medically as fewer than three stools per week and severe constipation as less than one stool per week. ° °One of the most common issues patients have following surgery is constipation.  Even if you have a regular bowel pattern at home, your normal regimen is likely to be disrupted due to multiple reasons following surgery.  Combination of anesthesia, postoperative narcotics, change in appetite and fluid intake all can affect your bowels.    In order to avoid complications following surgery, here are some recommendations in order to help you during your recovery period. ° °Colace (docusate) - Pick up an over-the-counter form of Colace or another stool softener and take twice a day as long as you are requiring postoperative pain medications.  Take with a full glass of water daily.  If you experience loose stools or diarrhea, hold the colace until you stool forms  back up.  If your symptoms do not get better within 1 week or if they get worse, check with your doctor. ° °Dulcolax (bisacodyl) - Pick up over-the-counter and take as directed by the product packaging as needed to assist with the movement of your bowels.  Take with a full glass of water.  Use this product as needed if not relieved by Colace only.  ° °MiraLax (polyethylene glycol) - Pick up over-the-counter to have on hand.  MiraLax is a solution that will increase the amount of water in your bowels to assist with bowel movements.  Take as directed and can mix with a glass of water, juice, soda, coffee, or tea.  Take if you go more than two days without a movement. °Do not use MiraLax more than once per day. Call your doctor if you are still constipated or irregular after using this medication for 7 days in a row. ° °If you continue to have problems with postoperative constipation, please contact the office for further assistance and recommendations.  If you experience "the worst abdominal pain ever" or develop nausea or vomiting, please contact the office immediatly for further recommendations for treatment. ° °ITCHING ° If you experience itching with your medications, try taking only a single pain pill, or even half a pain pill at a time.  You can also use Benadryl over the counter for itching or also to help with sleep.  ° °TED HOSE STOCKINGS °Wear the elastic stockings on both legs for three weeks following surgery during the day but you may remove then at night for sleeping. ° °MEDICATIONS °See your medication summary on the “After Visit Summary” that the nursing staff will review with you prior to discharge.  You may have some home medications which will be placed on hold until you complete the course of blood thinner medication.  It is important for you to complete the blood thinner medication as prescribed by your surgeon.  Continue your approved medications as instructed at time of  discharge. ° °PRECAUTIONS °If you experience chest pain or shortness of breath - call 911 immediately for transfer to the hospital emergency department.  °If you develop a fever greater that 101 F, purulent drainage from wound, increased redness or drainage from wound, foul odor from the wound/dressing, or calf pain - CONTACT YOUR SURGEON.   °                                                °FOLLOW-UP APPOINTMENTS °Make sure you keep all of your appointments after your operation with your surgeon and caregivers. You should call the office at the above phone number and make an appointment for approximately two weeks after the date of your surgery or on the date instructed by your surgeon outlined in the "After Visit Summary". ° ° °RANGE OF MOTION AND STRENGTHENING EXERCISES  °Rehabilitation of the knee is important following a knee injury or   an operation. After just a few days of immobilization, the muscles of the thigh which control the knee become weakened and shrink (atrophy). Knee exercises are designed to build up the tone and strength of the thigh muscles and to improve knee motion. Often times heat used for twenty to thirty minutes before working out will loosen up your tissues and help with improving the range of motion but do not use heat for the first two weeks following surgery. These exercises can be done on a training (exercise) mat, on the floor, on a table or on a bed. Use what ever works the best and is most comfortable for you Knee exercises include:  °Leg Lifts - While your knee is still immobilized in a splint or cast, you can do straight leg raises. Lift the leg to 60 degrees, hold for 3 sec, and slowly lower the leg. Repeat 10-20 times 2-3 times daily. Perform this exercise against resistance later as your knee gets better.  °Quad and Hamstring Sets - Tighten up the muscle on the front of the thigh (Quad) and hold for 5-10 sec. Repeat this 10-20 times hourly. Hamstring sets are done by pushing the  foot backward against an object and holding for 5-10 sec. Repeat as with quad sets.  °· Leg Slides: Lying on your back, slowly slide your foot toward your buttocks, bending your knee up off the floor (only go as far as is comfortable). Then slowly slide your foot back down until your leg is flat on the floor again. °· Angel Wings: Lying on your back spread your legs to the side as far apart as you can without causing discomfort.  °A rehabilitation program following serious knee injuries can speed recovery and prevent re-injury in the future due to weakened muscles. Contact your doctor or a physical therapist for more information on knee rehabilitation.  ° °IF YOU ARE TRANSFERRED TO A SKILLED REHAB FACILITY °If the patient is transferred to a skilled rehab facility following release from the hospital, a list of the current medications will be sent to the facility for the patient to continue.  When discharged from the skilled rehab facility, please have the facility set up the patient's Home Health Physical Therapy prior to being released. Also, the skilled facility will be responsible for providing the patient with their medications at time of release from the facility to include their pain medication, the muscle relaxants, and their blood thinner medication. If the patient is still at the rehab facility at time of the two week follow up appointment, the skilled rehab facility will also need to assist the patient in arranging follow up appointment in our office and any transportation needs. ° °MAKE SURE YOU:  °Understand these instructions.  °Get help right away if you are not doing well or get worse.  ° ° °Pick up stool softner and laxative for home use following surgery while on pain medications. °Do not submerge incision under water. °Please use good hand washing techniques while changing dressing each day. °May shower starting three days after surgery. °Please use a clean towel to pat the incision dry following  showers. °Continue to use ice for pain and swelling after surgery. °Do not use any lotions or creams on the incision until instructed by your surgeon. ° °Take Xarelto for two and a half more weeks, then discontinue Xarelto. °Once the patient has completed the Xarelto, they may resume the 81 mg Aspirin. ° °

## 2015-04-16 DIAGNOSIS — Z471 Aftercare following joint replacement surgery: Secondary | ICD-10-CM | POA: Diagnosis not present

## 2015-04-16 DIAGNOSIS — K5190 Ulcerative colitis, unspecified, without complications: Secondary | ICD-10-CM | POA: Diagnosis not present

## 2015-04-16 DIAGNOSIS — H9193 Unspecified hearing loss, bilateral: Secondary | ICD-10-CM | POA: Diagnosis not present

## 2015-04-16 DIAGNOSIS — I739 Peripheral vascular disease, unspecified: Secondary | ICD-10-CM | POA: Diagnosis not present

## 2015-04-16 DIAGNOSIS — I350 Nonrheumatic aortic (valve) stenosis: Secondary | ICD-10-CM | POA: Diagnosis not present

## 2015-04-16 DIAGNOSIS — I10 Essential (primary) hypertension: Secondary | ICD-10-CM | POA: Diagnosis not present

## 2015-04-17 DIAGNOSIS — I10 Essential (primary) hypertension: Secondary | ICD-10-CM | POA: Diagnosis not present

## 2015-04-17 DIAGNOSIS — I739 Peripheral vascular disease, unspecified: Secondary | ICD-10-CM | POA: Diagnosis not present

## 2015-04-17 DIAGNOSIS — Z471 Aftercare following joint replacement surgery: Secondary | ICD-10-CM | POA: Diagnosis not present

## 2015-04-17 DIAGNOSIS — H9193 Unspecified hearing loss, bilateral: Secondary | ICD-10-CM | POA: Diagnosis not present

## 2015-04-17 DIAGNOSIS — K5190 Ulcerative colitis, unspecified, without complications: Secondary | ICD-10-CM | POA: Diagnosis not present

## 2015-04-17 DIAGNOSIS — I350 Nonrheumatic aortic (valve) stenosis: Secondary | ICD-10-CM | POA: Diagnosis not present

## 2015-04-21 DIAGNOSIS — H9193 Unspecified hearing loss, bilateral: Secondary | ICD-10-CM | POA: Diagnosis not present

## 2015-04-21 DIAGNOSIS — Z471 Aftercare following joint replacement surgery: Secondary | ICD-10-CM | POA: Diagnosis not present

## 2015-04-21 DIAGNOSIS — K5190 Ulcerative colitis, unspecified, without complications: Secondary | ICD-10-CM | POA: Diagnosis not present

## 2015-04-21 DIAGNOSIS — I739 Peripheral vascular disease, unspecified: Secondary | ICD-10-CM | POA: Diagnosis not present

## 2015-04-21 DIAGNOSIS — I350 Nonrheumatic aortic (valve) stenosis: Secondary | ICD-10-CM | POA: Diagnosis not present

## 2015-04-21 DIAGNOSIS — I10 Essential (primary) hypertension: Secondary | ICD-10-CM | POA: Diagnosis not present

## 2015-04-23 DIAGNOSIS — I10 Essential (primary) hypertension: Secondary | ICD-10-CM | POA: Diagnosis not present

## 2015-04-23 DIAGNOSIS — H9193 Unspecified hearing loss, bilateral: Secondary | ICD-10-CM | POA: Diagnosis not present

## 2015-04-23 DIAGNOSIS — I350 Nonrheumatic aortic (valve) stenosis: Secondary | ICD-10-CM | POA: Diagnosis not present

## 2015-04-23 DIAGNOSIS — K5190 Ulcerative colitis, unspecified, without complications: Secondary | ICD-10-CM | POA: Diagnosis not present

## 2015-04-23 DIAGNOSIS — Z471 Aftercare following joint replacement surgery: Secondary | ICD-10-CM | POA: Diagnosis not present

## 2015-04-23 DIAGNOSIS — I739 Peripheral vascular disease, unspecified: Secondary | ICD-10-CM | POA: Diagnosis not present

## 2015-04-25 DIAGNOSIS — I350 Nonrheumatic aortic (valve) stenosis: Secondary | ICD-10-CM | POA: Diagnosis not present

## 2015-04-25 DIAGNOSIS — I10 Essential (primary) hypertension: Secondary | ICD-10-CM | POA: Diagnosis not present

## 2015-04-25 DIAGNOSIS — Z471 Aftercare following joint replacement surgery: Secondary | ICD-10-CM | POA: Diagnosis not present

## 2015-04-25 DIAGNOSIS — I739 Peripheral vascular disease, unspecified: Secondary | ICD-10-CM | POA: Diagnosis not present

## 2015-04-25 DIAGNOSIS — H9193 Unspecified hearing loss, bilateral: Secondary | ICD-10-CM | POA: Diagnosis not present

## 2015-04-25 DIAGNOSIS — K5190 Ulcerative colitis, unspecified, without complications: Secondary | ICD-10-CM | POA: Diagnosis not present

## 2015-04-29 DIAGNOSIS — Z96651 Presence of right artificial knee joint: Secondary | ICD-10-CM | POA: Diagnosis not present

## 2015-04-29 DIAGNOSIS — H9193 Unspecified hearing loss, bilateral: Secondary | ICD-10-CM | POA: Diagnosis not present

## 2015-04-29 DIAGNOSIS — Z471 Aftercare following joint replacement surgery: Secondary | ICD-10-CM | POA: Diagnosis not present

## 2015-04-29 DIAGNOSIS — I350 Nonrheumatic aortic (valve) stenosis: Secondary | ICD-10-CM | POA: Diagnosis not present

## 2015-04-29 DIAGNOSIS — I10 Essential (primary) hypertension: Secondary | ICD-10-CM | POA: Diagnosis not present

## 2015-04-29 DIAGNOSIS — K5190 Ulcerative colitis, unspecified, without complications: Secondary | ICD-10-CM | POA: Diagnosis not present

## 2015-04-29 DIAGNOSIS — I739 Peripheral vascular disease, unspecified: Secondary | ICD-10-CM | POA: Diagnosis not present

## 2015-05-01 DIAGNOSIS — I739 Peripheral vascular disease, unspecified: Secondary | ICD-10-CM | POA: Diagnosis not present

## 2015-05-01 DIAGNOSIS — I350 Nonrheumatic aortic (valve) stenosis: Secondary | ICD-10-CM | POA: Diagnosis not present

## 2015-05-01 DIAGNOSIS — I10 Essential (primary) hypertension: Secondary | ICD-10-CM | POA: Diagnosis not present

## 2015-05-01 DIAGNOSIS — H9193 Unspecified hearing loss, bilateral: Secondary | ICD-10-CM | POA: Diagnosis not present

## 2015-05-01 DIAGNOSIS — K5190 Ulcerative colitis, unspecified, without complications: Secondary | ICD-10-CM | POA: Diagnosis not present

## 2015-05-01 DIAGNOSIS — Z471 Aftercare following joint replacement surgery: Secondary | ICD-10-CM | POA: Diagnosis not present

## 2015-05-02 DIAGNOSIS — I739 Peripheral vascular disease, unspecified: Secondary | ICD-10-CM | POA: Diagnosis not present

## 2015-05-02 DIAGNOSIS — K5190 Ulcerative colitis, unspecified, without complications: Secondary | ICD-10-CM | POA: Diagnosis not present

## 2015-05-02 DIAGNOSIS — I10 Essential (primary) hypertension: Secondary | ICD-10-CM | POA: Diagnosis not present

## 2015-05-02 DIAGNOSIS — Z471 Aftercare following joint replacement surgery: Secondary | ICD-10-CM | POA: Diagnosis not present

## 2015-05-02 DIAGNOSIS — I350 Nonrheumatic aortic (valve) stenosis: Secondary | ICD-10-CM | POA: Diagnosis not present

## 2015-05-02 DIAGNOSIS — H9193 Unspecified hearing loss, bilateral: Secondary | ICD-10-CM | POA: Diagnosis not present

## 2015-05-20 DIAGNOSIS — Z471 Aftercare following joint replacement surgery: Secondary | ICD-10-CM | POA: Diagnosis not present

## 2015-05-20 DIAGNOSIS — Z96651 Presence of right artificial knee joint: Secondary | ICD-10-CM | POA: Diagnosis not present

## 2015-07-04 DIAGNOSIS — Z471 Aftercare following joint replacement surgery: Secondary | ICD-10-CM | POA: Diagnosis not present

## 2015-07-04 DIAGNOSIS — Z96651 Presence of right artificial knee joint: Secondary | ICD-10-CM | POA: Diagnosis not present

## 2015-07-08 DIAGNOSIS — Z125 Encounter for screening for malignant neoplasm of prostate: Secondary | ICD-10-CM | POA: Diagnosis not present

## 2015-07-08 DIAGNOSIS — M19049 Primary osteoarthritis, unspecified hand: Secondary | ICD-10-CM | POA: Diagnosis not present

## 2015-07-08 DIAGNOSIS — Z79899 Other long term (current) drug therapy: Secondary | ICD-10-CM | POA: Diagnosis not present

## 2015-07-08 DIAGNOSIS — R7301 Impaired fasting glucose: Secondary | ICD-10-CM | POA: Diagnosis not present

## 2015-07-08 DIAGNOSIS — F5101 Primary insomnia: Secondary | ICD-10-CM | POA: Diagnosis not present

## 2015-07-08 DIAGNOSIS — N529 Male erectile dysfunction, unspecified: Secondary | ICD-10-CM | POA: Diagnosis not present

## 2015-07-08 DIAGNOSIS — I10 Essential (primary) hypertension: Secondary | ICD-10-CM | POA: Diagnosis not present

## 2015-07-08 DIAGNOSIS — E7800 Pure hypercholesterolemia, unspecified: Secondary | ICD-10-CM | POA: Diagnosis not present

## 2015-07-08 DIAGNOSIS — Z0001 Encounter for general adult medical examination with abnormal findings: Secondary | ICD-10-CM | POA: Diagnosis not present

## 2015-11-21 ENCOUNTER — Other Ambulatory Visit: Payer: Self-pay | Admitting: Gastroenterology

## 2015-11-21 DIAGNOSIS — R634 Abnormal weight loss: Secondary | ICD-10-CM

## 2015-11-21 DIAGNOSIS — R112 Nausea with vomiting, unspecified: Secondary | ICD-10-CM

## 2015-11-21 DIAGNOSIS — R1114 Bilious vomiting: Secondary | ICD-10-CM | POA: Diagnosis not present

## 2015-12-01 DIAGNOSIS — M19039 Primary osteoarthritis, unspecified wrist: Secondary | ICD-10-CM | POA: Diagnosis not present

## 2015-12-01 DIAGNOSIS — M79641 Pain in right hand: Secondary | ICD-10-CM | POA: Diagnosis not present

## 2015-12-01 DIAGNOSIS — M1811 Unilateral primary osteoarthritis of first carpometacarpal joint, right hand: Secondary | ICD-10-CM | POA: Diagnosis not present

## 2015-12-02 ENCOUNTER — Ambulatory Visit
Admission: RE | Admit: 2015-12-02 | Discharge: 2015-12-02 | Disposition: A | Discharge status: Home / Self Care | Payer: Medicare Other | Source: Ambulatory Visit | Attending: Gastroenterology | Admitting: Gastroenterology

## 2015-12-02 DIAGNOSIS — R112 Nausea with vomiting, unspecified: Secondary | ICD-10-CM

## 2015-12-02 DIAGNOSIS — K5730 Diverticulosis of large intestine without perforation or abscess without bleeding: Secondary | ICD-10-CM | POA: Diagnosis not present

## 2015-12-02 DIAGNOSIS — R634 Abnormal weight loss: Secondary | ICD-10-CM

## 2015-12-02 MED ORDER — IOPAMIDOL (ISOVUE-300) INJECTION 61%
100.0000 mL | Freq: Once | INTRAVENOUS | Status: AC | PRN
  Administered 2015-12-02: 100 mL via INTRAVENOUS

## 2015-12-09 DIAGNOSIS — K2950 Unspecified chronic gastritis without bleeding: Secondary | ICD-10-CM | POA: Diagnosis not present

## 2015-12-09 DIAGNOSIS — R933 Abnormal findings on diagnostic imaging of other parts of digestive tract: Secondary | ICD-10-CM | POA: Diagnosis not present

## 2015-12-09 DIAGNOSIS — K648 Other hemorrhoids: Secondary | ICD-10-CM | POA: Diagnosis not present

## 2015-12-09 DIAGNOSIS — K449 Diaphragmatic hernia without obstruction or gangrene: Secondary | ICD-10-CM | POA: Diagnosis not present

## 2015-12-09 DIAGNOSIS — R112 Nausea with vomiting, unspecified: Secondary | ICD-10-CM | POA: Diagnosis not present

## 2015-12-09 DIAGNOSIS — K5730 Diverticulosis of large intestine without perforation or abscess without bleeding: Secondary | ICD-10-CM | POA: Diagnosis not present

## 2015-12-13 ENCOUNTER — Other Ambulatory Visit: Payer: Self-pay | Admitting: Gastroenterology

## 2015-12-13 DIAGNOSIS — R935 Abnormal findings on diagnostic imaging of other abdominal regions, including retroperitoneum: Secondary | ICD-10-CM

## 2015-12-13 DIAGNOSIS — R634 Abnormal weight loss: Secondary | ICD-10-CM

## 2015-12-24 ENCOUNTER — Other Ambulatory Visit: Payer: Self-pay | Admitting: Gastroenterology

## 2015-12-24 ENCOUNTER — Ambulatory Visit
Admission: RE | Admit: 2015-12-24 | Discharge: 2015-12-24 | Disposition: A | Discharge status: Home / Self Care | Payer: Medicare Other | Source: Ambulatory Visit | Attending: Gastroenterology | Admitting: Gastroenterology

## 2015-12-24 DIAGNOSIS — R634 Abnormal weight loss: Secondary | ICD-10-CM

## 2015-12-24 DIAGNOSIS — Z189 Retained foreign body fragments, unspecified material: Principal | ICD-10-CM

## 2015-12-24 DIAGNOSIS — H0553 Retained (old) foreign body following penetrating wound of bilateral orbits: Secondary | ICD-10-CM

## 2015-12-24 DIAGNOSIS — K7689 Other specified diseases of liver: Secondary | ICD-10-CM | POA: Diagnosis not present

## 2015-12-24 DIAGNOSIS — Z01818 Encounter for other preprocedural examination: Secondary | ICD-10-CM | POA: Diagnosis not present

## 2015-12-24 DIAGNOSIS — R935 Abnormal findings on diagnostic imaging of other abdominal regions, including retroperitoneum: Secondary | ICD-10-CM

## 2015-12-31 MED ORDER — GADOBENATE DIMEGLUMINE 529 MG/ML IV SOLN
15.0000 mL | Freq: Once | INTRAVENOUS | Status: AC | PRN
  Administered 2015-12-31: 15 mL via INTRAVENOUS

## 2016-02-10 DIAGNOSIS — R634 Abnormal weight loss: Secondary | ICD-10-CM | POA: Diagnosis not present

## 2016-02-10 DIAGNOSIS — K219 Gastro-esophageal reflux disease without esophagitis: Secondary | ICD-10-CM | POA: Diagnosis not present

## 2016-04-15 DIAGNOSIS — K219 Gastro-esophageal reflux disease without esophagitis: Secondary | ICD-10-CM | POA: Diagnosis not present

## 2016-04-15 DIAGNOSIS — R634 Abnormal weight loss: Secondary | ICD-10-CM | POA: Diagnosis not present

## 2016-06-03 ENCOUNTER — Emergency Department (HOSPITAL_COMMUNITY): Payer: Medicare Other

## 2016-06-03 ENCOUNTER — Inpatient Hospital Stay (HOSPITAL_COMMUNITY)
Admission: EM | Admit: 2016-06-03 | Discharge: 2016-06-07 | DRG: 896 | Disposition: A | Discharge status: Home / Self Care | Payer: Medicare Other | Attending: Internal Medicine | Admitting: Internal Medicine

## 2016-06-03 ENCOUNTER — Encounter (HOSPITAL_COMMUNITY): Payer: Self-pay | Admitting: Emergency Medicine

## 2016-06-03 DIAGNOSIS — R4182 Altered mental status, unspecified: Secondary | ICD-10-CM

## 2016-06-03 DIAGNOSIS — F10231 Alcohol dependence with withdrawal delirium: Secondary | ICD-10-CM | POA: Diagnosis present

## 2016-06-03 DIAGNOSIS — I712 Thoracic aortic aneurysm, without rupture, unspecified: Secondary | ICD-10-CM | POA: Diagnosis present

## 2016-06-03 DIAGNOSIS — Z8042 Family history of malignant neoplasm of prostate: Secondary | ICD-10-CM

## 2016-06-03 DIAGNOSIS — R748 Abnormal levels of other serum enzymes: Secondary | ICD-10-CM

## 2016-06-03 DIAGNOSIS — J189 Pneumonia, unspecified organism: Secondary | ICD-10-CM | POA: Diagnosis present

## 2016-06-03 DIAGNOSIS — G40901 Epilepsy, unspecified, not intractable, with status epilepticus: Secondary | ICD-10-CM

## 2016-06-03 DIAGNOSIS — I4581 Long QT syndrome: Secondary | ICD-10-CM | POA: Diagnosis present

## 2016-06-03 DIAGNOSIS — I5043 Acute on chronic combined systolic (congestive) and diastolic (congestive) heart failure: Secondary | ICD-10-CM | POA: Diagnosis present

## 2016-06-03 DIAGNOSIS — Z87891 Personal history of nicotine dependence: Secondary | ICD-10-CM | POA: Diagnosis not present

## 2016-06-03 DIAGNOSIS — F12288 Cannabis dependence with other cannabis-induced disorder: Secondary | ICD-10-CM | POA: Diagnosis present

## 2016-06-03 DIAGNOSIS — G8929 Other chronic pain: Secondary | ICD-10-CM | POA: Diagnosis present

## 2016-06-03 DIAGNOSIS — Z77090 Contact with and (suspected) exposure to asbestos: Secondary | ICD-10-CM | POA: Diagnosis present

## 2016-06-03 DIAGNOSIS — Z9841 Cataract extraction status, right eye: Secondary | ICD-10-CM | POA: Diagnosis not present

## 2016-06-03 DIAGNOSIS — R451 Restlessness and agitation: Secondary | ICD-10-CM | POA: Diagnosis present

## 2016-06-03 DIAGNOSIS — F10239 Alcohol dependence with withdrawal, unspecified: Secondary | ICD-10-CM | POA: Diagnosis not present

## 2016-06-03 DIAGNOSIS — Z7982 Long term (current) use of aspirin: Secondary | ICD-10-CM

## 2016-06-03 DIAGNOSIS — Z781 Physical restraint status: Secondary | ICD-10-CM

## 2016-06-03 DIAGNOSIS — E785 Hyperlipidemia, unspecified: Secondary | ICD-10-CM | POA: Diagnosis present

## 2016-06-03 DIAGNOSIS — H9190 Unspecified hearing loss, unspecified ear: Secondary | ICD-10-CM | POA: Diagnosis present

## 2016-06-03 DIAGNOSIS — I350 Nonrheumatic aortic (valve) stenosis: Secondary | ICD-10-CM | POA: Diagnosis present

## 2016-06-03 DIAGNOSIS — R569 Unspecified convulsions: Secondary | ICD-10-CM

## 2016-06-03 DIAGNOSIS — Z9842 Cataract extraction status, left eye: Secondary | ICD-10-CM | POA: Diagnosis not present

## 2016-06-03 DIAGNOSIS — Z8249 Family history of ischemic heart disease and other diseases of the circulatory system: Secondary | ICD-10-CM

## 2016-06-03 DIAGNOSIS — Z825 Family history of asthma and other chronic lower respiratory diseases: Secondary | ICD-10-CM

## 2016-06-03 DIAGNOSIS — G40501 Epileptic seizures related to external causes, not intractable, with status epilepticus: Secondary | ICD-10-CM | POA: Diagnosis present

## 2016-06-03 DIAGNOSIS — M6282 Rhabdomyolysis: Secondary | ICD-10-CM | POA: Diagnosis present

## 2016-06-03 DIAGNOSIS — K219 Gastro-esophageal reflux disease without esophagitis: Secondary | ICD-10-CM | POA: Diagnosis present

## 2016-06-03 DIAGNOSIS — R296 Repeated falls: Secondary | ICD-10-CM | POA: Diagnosis present

## 2016-06-03 DIAGNOSIS — G4733 Obstructive sleep apnea (adult) (pediatric): Secondary | ICD-10-CM | POA: Diagnosis present

## 2016-06-03 DIAGNOSIS — G92 Toxic encephalopathy: Secondary | ICD-10-CM | POA: Diagnosis present

## 2016-06-03 DIAGNOSIS — I10 Essential (primary) hypertension: Secondary | ICD-10-CM | POA: Diagnosis present

## 2016-06-03 DIAGNOSIS — E876 Hypokalemia: Secondary | ICD-10-CM | POA: Diagnosis present

## 2016-06-03 DIAGNOSIS — R7989 Other specified abnormal findings of blood chemistry: Secondary | ICD-10-CM | POA: Diagnosis present

## 2016-06-03 DIAGNOSIS — Z79899 Other long term (current) drug therapy: Secondary | ICD-10-CM

## 2016-06-03 DIAGNOSIS — D72829 Elevated white blood cell count, unspecified: Secondary | ICD-10-CM | POA: Diagnosis present

## 2016-06-03 DIAGNOSIS — I248 Other forms of acute ischemic heart disease: Secondary | ICD-10-CM | POA: Diagnosis present

## 2016-06-03 DIAGNOSIS — Z96653 Presence of artificial knee joint, bilateral: Secondary | ICD-10-CM | POA: Diagnosis present

## 2016-06-03 DIAGNOSIS — F10931 Alcohol use, unspecified with withdrawal delirium: Secondary | ICD-10-CM

## 2016-06-03 DIAGNOSIS — F329 Major depressive disorder, single episode, unspecified: Secondary | ICD-10-CM | POA: Diagnosis present

## 2016-06-03 DIAGNOSIS — R778 Other specified abnormalities of plasma proteins: Secondary | ICD-10-CM | POA: Diagnosis present

## 2016-06-03 DIAGNOSIS — F4310 Post-traumatic stress disorder, unspecified: Secondary | ICD-10-CM | POA: Diagnosis present

## 2016-06-03 DIAGNOSIS — T796XXA Traumatic ischemia of muscle, initial encounter: Secondary | ICD-10-CM | POA: Diagnosis not present

## 2016-06-03 LAB — URINALYSIS, ROUTINE W REFLEX MICROSCOPIC
Bilirubin Urine: NEGATIVE
Glucose, UA: NEGATIVE mg/dL
HGB URINE DIPSTICK: NEGATIVE
Ketones, ur: NEGATIVE mg/dL
LEUKOCYTES UA: NEGATIVE
Nitrite: NEGATIVE
PROTEIN: 30 mg/dL — AB
SQUAMOUS EPITHELIAL / LPF: NONE SEEN
Specific Gravity, Urine: 1.013 (ref 1.005–1.030)
pH: 6 (ref 5.0–8.0)

## 2016-06-03 LAB — DIFFERENTIAL
BASOS ABS: 0 10*3/uL (ref 0.0–0.1)
BASOS PCT: 0 %
EOS ABS: 0 10*3/uL (ref 0.0–0.7)
EOS PCT: 0 %
Lymphocytes Relative: 4 %
Lymphs Abs: 0.7 10*3/uL (ref 0.7–4.0)
Monocytes Absolute: 1 10*3/uL (ref 0.1–1.0)
Monocytes Relative: 6 %
NEUTROS PCT: 90 %
Neutro Abs: 15.3 10*3/uL — ABNORMAL HIGH (ref 1.7–7.7)

## 2016-06-03 LAB — COMPREHENSIVE METABOLIC PANEL
ALBUMIN: 4.6 g/dL (ref 3.5–5.0)
ALT: 21 U/L (ref 17–63)
ANION GAP: 11 (ref 5–15)
AST: 33 U/L (ref 15–41)
Alkaline Phosphatase: 74 U/L (ref 38–126)
BUN: 12 mg/dL (ref 6–20)
CHLORIDE: 103 mmol/L (ref 101–111)
CO2: 21 mmol/L — AB (ref 22–32)
Calcium: 9.1 mg/dL (ref 8.9–10.3)
Creatinine, Ser: 0.81 mg/dL (ref 0.61–1.24)
GFR calc Af Amer: >60 mL/min (ref >60)
GFR calc non Af Amer: >60 mL/min (ref >60)
GLUCOSE: 123 mg/dL — AB (ref 65–99)
POTASSIUM: 3.3 mmol/L — AB (ref 3.5–5.1)
SODIUM: 135 mmol/L (ref 135–145)
Total Bilirubin: 1 mg/dL (ref 0.3–1.2)
Total Protein: 7.1 g/dL (ref 6.5–8.1)

## 2016-06-03 LAB — ETHANOL: Alcohol, Ethyl (B): <5 mg/dL (ref <5)

## 2016-06-03 LAB — I-STAT CHEM 8, ED
BUN: 13 mg/dL (ref 6–20)
Calcium, Ion: 1.08 mmol/L — ABNORMAL LOW (ref 1.15–1.40)
Chloride: 104 mmol/L (ref 101–111)
Creatinine, Ser: 0.7 mg/dL (ref 0.61–1.24)
Glucose, Bld: 126 mg/dL — ABNORMAL HIGH (ref 65–99)
HEMATOCRIT: 43 % (ref 39.0–52.0)
HEMOGLOBIN: 14.6 g/dL (ref 13.0–17.0)
POTASSIUM: 3.2 mmol/L — AB (ref 3.5–5.1)
SODIUM: 136 mmol/L (ref 135–145)
TCO2: 23 mmol/L (ref 0–100)

## 2016-06-03 LAB — I-STAT TROPONIN, ED: Troponin i, poc: 0.04 ng/mL (ref 0.00–0.08)

## 2016-06-03 LAB — PROTIME-INR
INR: 0.89
PROTHROMBIN TIME: 12.1 s (ref 11.4–15.2)

## 2016-06-03 LAB — CBC
HCT: 40.4 % (ref 39.0–52.0)
Hemoglobin: 13.9 g/dL (ref 13.0–17.0)
MCH: 31 pg (ref 26.0–34.0)
MCHC: 34.4 g/dL (ref 30.0–36.0)
MCV: 90 fL (ref 78.0–100.0)
PLATELETS: 245 10*3/uL (ref 150–400)
RBC: 4.49 MIL/uL (ref 4.22–5.81)
RDW: 13.2 % (ref 11.5–15.5)
WBC: 17 10*3/uL — ABNORMAL HIGH (ref 4.0–10.5)

## 2016-06-03 LAB — RAPID URINE DRUG SCREEN, HOSP PERFORMED
AMPHETAMINES: NOT DETECTED
BENZODIAZEPINES: POSITIVE — AB
Barbiturates: NOT DETECTED
COCAINE: NOT DETECTED
Opiates: NOT DETECTED
TETRAHYDROCANNABINOL: POSITIVE — AB

## 2016-06-03 LAB — CBG MONITORING, ED: GLUCOSE-CAPILLARY: 154 mg/dL — AB (ref 65–99)

## 2016-06-03 LAB — APTT: APTT: 26 s (ref 24–36)

## 2016-06-03 MED ORDER — MAGNESIUM SULFATE IN D5W 1-5 GM/100ML-% IV SOLN
1.0000 g | Freq: Once | INTRAVENOUS | Status: AC
  Administered 2016-06-04: 1 g via INTRAVENOUS
  Filled 2016-06-03: qty 100

## 2016-06-03 MED ORDER — ENOXAPARIN SODIUM 40 MG/0.4ML ~~LOC~~ SOLN
40.0000 mg | Freq: Every day | SUBCUTANEOUS | Status: DC
  Administered 2016-06-04 – 2016-06-06 (×3): 40 mg via SUBCUTANEOUS
  Filled 2016-06-03 (×3): qty 0.4

## 2016-06-03 MED ORDER — ATORVASTATIN CALCIUM 40 MG PO TABS
40.0000 mg | ORAL_TABLET | Freq: Every day | ORAL | Status: DC
  Administered 2016-06-05 – 2016-06-07 (×3): 40 mg via ORAL
  Filled 2016-06-03 (×3): qty 1

## 2016-06-03 MED ORDER — ONDANSETRON HCL 4 MG/2ML IJ SOLN
4.0000 mg | Freq: Four times a day (QID) | INTRAMUSCULAR | Status: DC | PRN
  Filled 2016-06-03: qty 2

## 2016-06-03 MED ORDER — ACETAMINOPHEN 650 MG RE SUPP: 650.0000 mg | Freq: Four times a day (QID) | RECTAL | Status: DC | PRN

## 2016-06-03 MED ORDER — LORAZEPAM 2 MG/ML IJ SOLN
1.0000 mg | Freq: Once | INTRAMUSCULAR | Status: AC
  Administered 2016-06-03: 1 mg via INTRAVENOUS

## 2016-06-03 MED ORDER — POTASSIUM CHLORIDE IN NACL 40-0.9 MEQ/L-% IV SOLN
INTRAVENOUS | Status: DC
  Administered 2016-06-04 (×2): 100 mL/h via INTRAVENOUS
  Filled 2016-06-03 (×2): qty 1000

## 2016-06-03 MED ORDER — LORAZEPAM 2 MG/ML IJ SOLN
2.0000 mg | INTRAMUSCULAR | Status: DC | PRN
  Administered 2016-06-04 (×3): 2 mg via INTRAVENOUS
  Administered 2016-06-04: 3 mg via INTRAVENOUS
  Administered 2016-06-04 (×4): 2 mg via INTRAVENOUS
  Administered 2016-06-04: 3 mg via INTRAVENOUS
  Administered 2016-06-05: 2 mg via INTRAVENOUS
  Filled 2016-06-03 (×3): qty 1
  Filled 2016-06-03: qty 2
  Filled 2016-06-03 (×3): qty 1
  Filled 2016-06-03: qty 2
  Filled 2016-06-03 (×2): qty 1
  Filled 2016-06-03: qty 2
  Filled 2016-06-03 (×2): qty 1

## 2016-06-03 MED ORDER — ADULT MULTIVITAMIN W/MINERALS CH
1.0000 | ORAL_TABLET | Freq: Every day | ORAL | Status: DC
  Administered 2016-06-05 – 2016-06-07 (×3): 1 via ORAL
  Filled 2016-06-03 (×3): qty 1

## 2016-06-03 MED ORDER — LORAZEPAM 2 MG/ML IJ SOLN
INTRAMUSCULAR | Status: AC
  Administered 2016-06-03: 2 mg
  Filled 2016-06-03: qty 1

## 2016-06-03 MED ORDER — LORAZEPAM 2 MG/ML IJ SOLN
4.0000 mg | Freq: Once | INTRAMUSCULAR | Status: DC
  Filled 2016-06-03: qty 2

## 2016-06-03 MED ORDER — AMLODIPINE BESYLATE 2.5 MG PO TABS: 2.5000 mg | ORAL_TABLET | Freq: Every day | ORAL | Status: DC

## 2016-06-03 MED ORDER — POTASSIUM CHLORIDE IN NACL 20-0.9 MEQ/L-% IV SOLN
INTRAVENOUS | Status: DC
  Filled 2016-06-03: qty 1000

## 2016-06-03 MED ORDER — LORAZEPAM 2 MG/ML IJ SOLN: 2.0000 mg | Freq: Once | INTRAMUSCULAR | Status: DC

## 2016-06-03 MED ORDER — LORAZEPAM 2 MG/ML IJ SOLN
INTRAMUSCULAR | Status: AC
  Administered 2016-06-03: 2 mg
  Filled 2016-06-03: qty 3

## 2016-06-03 MED ORDER — ONDANSETRON HCL 4 MG PO TABS: 4.0000 mg | ORAL_TABLET | Freq: Four times a day (QID) | ORAL | Status: DC | PRN

## 2016-06-03 MED ORDER — LORAZEPAM 2 MG/ML IJ SOLN
2.0000 mg | Freq: Once | INTRAMUSCULAR | Status: AC
  Administered 2016-06-03: 1 mg via INTRAVENOUS
  Filled 2016-06-03: qty 1

## 2016-06-03 MED ORDER — FOLIC ACID 5 MG/ML IJ SOLN
1.0000 mg | Freq: Every day | INTRAMUSCULAR | Status: DC
  Administered 2016-06-04 – 2016-06-06 (×4): 1 mg via INTRAVENOUS
  Filled 2016-06-03 (×4): qty 0.2

## 2016-06-03 MED ORDER — THIAMINE HCL 100 MG/ML IJ SOLN
100.0000 mg | Freq: Every day | INTRAMUSCULAR | Status: DC
  Administered 2016-06-04 – 2016-06-06 (×4): 100 mg via INTRAVENOUS
  Filled 2016-06-03 (×4): qty 2

## 2016-06-03 MED ORDER — ACETAMINOPHEN 325 MG PO TABS
650.0000 mg | ORAL_TABLET | Freq: Four times a day (QID) | ORAL | Status: DC | PRN
  Administered 2016-06-07: 650 mg via ORAL
  Filled 2016-06-03: qty 2

## 2016-06-03 MED ORDER — FENTANYL CITRATE (PF) 100 MCG/2ML IJ SOLN
50.0000 ug | Freq: Once | INTRAMUSCULAR | Status: DC
  Filled 2016-06-03: qty 2

## 2016-06-03 MED ORDER — SODIUM CHLORIDE 0.9% FLUSH
3.0000 mL | Freq: Two times a day (BID) | INTRAVENOUS | Status: DC
  Administered 2016-06-04 – 2016-06-06 (×5): 3 mL via INTRAVENOUS

## 2016-06-03 MED ORDER — ASPIRIN 81 MG PO CHEW
81.0000 mg | CHEWABLE_TABLET | Freq: Every day | ORAL | Status: DC
  Administered 2016-06-05 – 2016-06-07 (×3): 81 mg via ORAL
  Filled 2016-06-03 (×3): qty 1

## 2016-06-03 MED ORDER — PANTOPRAZOLE SODIUM 40 MG PO TBEC: 40.0000 mg | DELAYED_RELEASE_TABLET | Freq: Every day | ORAL | Status: DC

## 2016-06-03 MED ORDER — SODIUM CHLORIDE 0.9 % IV SOLN
1000.0000 mg | Freq: Once | INTRAVENOUS | Status: AC
  Administered 2016-06-03: 1000 mg via INTRAVENOUS
  Filled 2016-06-03: qty 10

## 2016-06-03 NOTE — ED Notes (Signed)
Pt brought back from CT. CT stated patient was unable to have scan performed due to him being restless and not laying still. Pt brought back from CT.

## 2016-06-03 NOTE — Consult Note (Signed)
NEURO HOSPITALIST CONSULT NOTE   Requestig physician: Dr. Jeanell Sparrow  Reason for Consult: Seizures  History obtained from:  Chart   HPI:                                                                                                                                          Justin Sutton is an 71 y.o. male who presented from home after having 4 seizures. EMS witnessed a GTC seizure that lasted for approximately 2 minutes. The patient received 5 mg IM Versed for this. He was combative and confused on arrival. Per family, he has recently started drinking again, estimated at 6 beers per day.   EtOH level in ED was < 5. In the context of his history of heavy drinking, this was felt to increase the likelihood that this presentation was due to EtOH withdrawal. Of note, he was THC positive on UDS. CBC revealed elevated white count of 17. EKG revealed sinus tachycardia.   CBG was 154. He has been having frequent falls per family. During CT he became very agitated which continued despite 12 mg of Ativan. His agitation began to resolve after an additional 4 mg Ativan, allowing for CT head to be completed. CT revealed no acute abnormality; chronic small vessel ischemic changes were noted.    Past Medical History:  Diagnosis Date  . Arthritis   . Chronic cough   . Essential hypertension 02/14/2015  . GERD (gastroesophageal reflux disease)   . H/O asbestos exposure   . Hearing loss   . Heart murmur   . Hyperlipidemia   . Hypertension   . Nocturia   . Numbness    comes and goes per right hand   . OSA (obstructive sleep apnea)   . Thoracic aortic aneurysm without rupture ([facility]) 02/14/2015   4.1 cm 01/2015    Past Surgical History:  Procedure Laterality Date  . CATARACT EXTRACTION  2011   bilat   . HAND SURGERY Right 1983  . HERNIA REPAIR  2011  . TONSILLECTOMY    . TOTAL KNEE ARTHROPLASTY Left 2011  . TOTAL KNEE ARTHROPLASTY Right 04/14/2015   Procedure: RIGHT TOTAL KNEE  ARTHROPLASTY;  Surgeon: Gaynelle Arabian, MD;  Location: [facility];  Service: Orthopedics;  Laterality: Right;    Family History  Problem Relation Age of Onset  . Asthma Mother   . Heart disease Father   . Prostate cancer Father    Social History:  reports that he quit smoking about 46 years ago. His smoking use included Cigarettes. He has a 45.00 pack-year smoking history. He has never used smokeless tobacco. He reports that he drinks alcohol. He reports that he does not use drugs.  No Known Allergies  MEDICATIONS:  Prior to Admission:  Prescriptions Prior to Admission  Medication Sig Dispense Refill Last Dose  . amLODipine (NORVASC) 2.5 MG tablet Take 2.5 mg by mouth daily.   06/03/2016 at Unknown time  . aspirin 81 MG chewable tablet Chew 81 mg by mouth daily.   06/03/2016 at Unknown time  . atorvastatin (LIPITOR) 40 MG tablet Take 40 mg by mouth daily.   06/03/2016 at Unknown time  . omeprazole (PRILOSEC) 40 MG capsule Take 40 mg by mouth daily.   06/03/2016 at Unknown time   Scheduled: . amLODipine  2.5 mg Oral Daily  . aspirin  81 mg Oral Daily  . atorvastatin  40 mg Oral Daily  . enoxaparin (LOVENOX) injection  40 mg Subcutaneous Daily  . folic acid  1 mg Intravenous Daily  . multivitamin with minerals  1 tablet Oral Daily  . pantoprazole  40 mg Oral Daily  . sodium chloride flush  3 mL Intravenous Q12H  . thiamine  100 mg Intravenous Daily     ROS:                                                                                                                                       Unable to obtain due to postictal state.   Blood pressure 124/82, pulse 98, temperature 98.7 F (37.1 C), temperature source Rectal, resp. rate 24, height 5\' 11"  (1.803 m), weight 74.8 kg (165 lb), SpO2 99 %.  General Examination:                                                                                                       HEENT-  Normocephalic/atraumatic.    Lungs- Sonorous breathing in context of postictal somnolence.  Extremities- Warm and well-perfused  Neurological Examination Mental Status: Somnolent. Does not follow any commands or answer questions. Briefly arouses and attends to extremities during application of noxious stimuli.  Cranial Nerves: II: Unable to formally assess visual fields. Does not blink to threat in context of postictal state. Resists eye opening. Pupils symmetric. III,IV, VI: Eyes conjugate while briefly opened. Briefly gazes at one limb during noxious stimulus. Does not follow commands for testing of visual pursuits.  V,VII: No gross facial asymmetry VIII: Unable to assess IX,X: Unable to assess  XI:  Unable to assess XII:  Unable to assess Motor/Sensory: Moves all 4 extremities symmetrically to noxious stimuli at 3/5 or greater. Deep Tendon Reflexes: 2+ and symmetric throughout Cerebellar/Gait: Unable to assess.   Lab Results:  Basic Metabolic Panel:  Recent Labs Lab 06/03/16 1658 06/03/16 1711  NA 135 136  K 3.3* 3.2*  CL 103 104  CO2 21*  --   GLUCOSE 123* 126*  BUN 12 13  CREATININE 0.81 0.70  CALCIUM 9.1  --     Liver Function Tests:  Recent Labs Lab 06/03/16 1658  AST 33  ALT 21  ALKPHOS 74  BILITOT 1.0  PROT 7.1  ALBUMIN 4.6   No results for input(s): LIPASE, AMYLASE in the last 168 hours. No results for input(s): AMMONIA in the last 168 hours.  CBC:  Recent Labs Lab 06/03/16 1658 06/03/16 1711  WBC 17.0*  --   NEUTROABS 15.3*  --   HGB 13.9 14.6  HCT 40.4 43.0  MCV 90.0  --   PLT 245  --     Cardiac Enzymes: No results for input(s): CKTOTAL, CKMB, CKMBINDEX, TROPONINI in the last 168 hours.  Lipid Panel: No results for input(s): CHOL, TRIG, HDL, CHOLHDL, VLDL, LDLCALC in the last 168 hours.  CBG:  Recent Labs Lab 06/03/16 1638  GLUCAP 154*    Microbiology: Results for orders placed or  performed during the hospital encounter of 04/08/15  Surgical pcr screen     Status: None   Collection Time: 04/08/15  8:56 AM  Result Value Ref Range Status   MRSA, PCR NEGATIVE NEGATIVE Final   Staphylococcus aureus NEGATIVE NEGATIVE Final    Comment:        The Xpert SA Assay (FDA approved for NASAL specimens in patients over 16 years of age), is one component of a comprehensive surveillance program.  Test performance has been validated by [facility] for patients greater than or equal to 99 year old. It is not intended to diagnose infection nor to guide or monitor treatment.     Coagulation Studies:  Recent Labs  06/03/16 1658  LABPROT 12.1  INR 0.89    Imaging: Ct Head Wo Contrast  Result Date: 06/03/2016 CLINICAL DATA:  Seizure EXAM: CT HEAD WITHOUT CONTRAST TECHNIQUE: Contiguous axial images were obtained from the base of the skull through the vertex without intravenous contrast. COMPARISON:  None FINDINGS: Brain: There is atrophy and chronic small vessel disease changes. No acute intracranial abnormality. Specifically, no hemorrhage, hydrocephalus, mass lesion, acute infarction, or significant intracranial injury. Vascular: No hyperdense vessel or unexpected calcification. Skull: No acute calvarial abnormality. Sinuses/Orbits: Mild mucosal thickening in the maxillary sinuses and scattered ethmoid air cells. No air-fluid levels. Other: None IMPRESSION: No acute intracranial abnormality. Atrophy, chronic microvascular disease. Electronically Signed   By: Rolm Baptise M.D.   On: 06/03/2016 21:08   Dg Chest Port 1 View  Result Date: 06/03/2016 CLINICAL DATA:  Recent seizure activity EXAM: PORTABLE CHEST 1 VIEW COMPARISON:  01/20/2015 FINDINGS: The heart size and mediastinal contours are within normal limits. Both lungs are clear. The visualized skeletal structures are unremarkable. IMPRESSION: No active disease. Electronically Signed   By: Inez Catalina M.D.   On: 06/03/2016  19:00    Assessment: 1. 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 Currently postictal.  2. Substance abuse. THC positive.  3. CT head reveals no acute abnormality.  4. At risk for delirium tremens and additional seizures.   Recommendations: 1. Seizure precautions.  2. CIWA protocol. May need scheduled benzodiazepine for 3 days followed by a taper in addition to PRN.  3. Agree with thiamine.  4. Alcohol cessation counseling when fully awake and alert.  5. EEG.   Electronically signed: Dr. Randall Hiss  Anayansi Rundquist 06/03/2016, 9:50 PM

## 2016-06-03 NOTE — ED Notes (Signed)
cbg was 154

## 2016-06-03 NOTE — ED Notes (Signed)
Pt fell outside today, has been falling frequently per family--

## 2016-06-03 NOTE — ED Triage Notes (Signed)
To ED via [facility] from home-- pt has had 4 seizures today-- EMS witnessed a grandmal -- full body type seizure lasting approx 2 minutes-- pt received Midazolam 5mg  IM, pt is combative and confused on arrival-- trying to get out of bed. Family at bedside at present. Pt has recently started drinking again approx 6 beers a day--

## 2016-06-03 NOTE — ED Notes (Signed)
Pt transported to CT ?

## 2016-06-03 NOTE — ED Notes (Signed)
Pt became very agitated on CT scan, pt returned to his room for attempt to calm him down, pt still very agitated, Dr. Jeanell Sparrow notified, security at the bedside to help hold the pt on bed, 12 mg of Ativan given per MD order and pt still very agitated on bed, last dose of 4 mg Ativan given while family a the bedside, pt looks like relaxing some, CT notified to get CT completed.

## 2016-06-03 NOTE — H&P (Signed)
History and Physical    LENORRIS RUTT [id number] DOB: January 03, 1946 DOA: 06/03/2016  PCP: Lujean Amel, MD   Patient coming from: Home  Chief Complaint: Seizures   HPI: Justin Sutton is a 71 y.o. male with medical history significant for hypertension, hyperlipidemia, GERD, and alcohol dependence who presents to the emergency department after 4 witnessed episodes of apparent generalized seizure activity. Patient's family reports a episode of generalized convulsions at approximately 10:15 AM that lasted for several minutes and was followed by confusion. EMS was activated and evaluated the patient at his home where he was confused initially, but his mental status seemed to clear and he refused transport. He then went on to have 3 more very similar episodes and EMS was again called out to the house. Patient had a witnessed episode with EMS and was given 5 mg of IM Versed. Patient has reportedly been drinking about 6 beers per day, but per family report may have only had one last night. Family does note that the patient hides things and can be secretive, suggesting that he may consume more alcohol than they're aware of. They report that he did have a remote seizure once that was not evaluated by a healthcare provider. Family notes some frequent falls recently but are not aware of any significant injury resulting from this. Patient seemed to be in his usual state of health last night.  ED Course: Upon arrival to the ED, patient is found to be afebrile, saturating well on room air, tachycardic in the 120s, and hypertensive to 155/110. EKG features a sinus tachycardia with rate 105 and chest x-ray is negative for acute cardiopulmonary disease. Patient required large doses of IV Ativan for agitation before a CT of the head could be completed. Ultimately, CT head without contrast is negative for acute intracranial abnormality. Chemistry panel features a potassium of 3.3, bicarbonate of 21, and is otherwise  unremarkable. Ethanol level was undetectable and CBC is notable for a leukocytosis to 17,000. INR is within the normal limits and UDS is positive for THC and benzodiazepines. Urinalysis is unremarkable. Neurology was consulted by the ED physician and has evaluated the patient in the emergency department. He was given 1 g of Keppra and multiple doses of IV Ativan in order to facilitate CT head. Tachycardia and hypertension resolved after the sedatives were given and the patient remained hemodynamically stable and not in any apparent respiratory distress. He is heavily sedated at time of admission and will be admitted to the stepdown unit initially for closer monitoring of his respiratory status.  Review of Systems:  Unable to obtain ROS secondary to patient's clinical condition with heavy sedation following high doses of Ativan.  Past Medical History:  Diagnosis Date  . Arthritis   . Chronic cough   . Essential hypertension 02/14/2015  . GERD (gastroesophageal reflux disease)   . H/O asbestos exposure   . Hearing loss   . Heart murmur   . Hyperlipidemia   . Hypertension   . Nocturia   . Numbness    comes and goes per right hand   . OSA (obstructive sleep apnea)   . Thoracic aortic aneurysm without rupture ([place]) 02/14/2015   4.1 cm 01/2015    Past Surgical History:  Procedure Laterality Date  . CATARACT EXTRACTION  2011   bilat   . HAND SURGERY Right 1983  . HERNIA REPAIR  2011  . TONSILLECTOMY    . TOTAL KNEE ARTHROPLASTY Left 2011  . TOTAL KNEE ARTHROPLASTY Right  04/14/2015   Procedure: RIGHT TOTAL KNEE ARTHROPLASTY;  Surgeon: Gaynelle Arabian, MD;  Location: [facility];  Service: Orthopedics;  Laterality: Right;     reports that he quit smoking about 46 years ago. His smoking use included Cigarettes. He has a 45.00 pack-year smoking history. He has never used smokeless tobacco. He reports that he drinks alcohol. He reports that he does not use drugs.  No Known Allergies  Family  History  Problem Relation Age of Onset  . Asthma Mother   . Heart disease Father   . Prostate cancer Father      Prior to Admission medications   Medication Sig Start Date End Date Taking? Authorizing Provider  amLODipine (NORVASC) 2.5 MG tablet Take 2.5 mg by mouth daily.   Yes Historical Provider, MD  aspirin 81 MG chewable tablet Chew 81 mg by mouth daily.   Yes Historical Provider, MD  atorvastatin (LIPITOR) 40 MG tablet Take 40 mg by mouth daily.   Yes Historical Provider, MD  omeprazole (PRILOSEC) 40 MG capsule Take 40 mg by mouth daily.   Yes Historical Provider, MD    Physical Exam: Vitals:   06/03/16 2045 06/03/16 2100 06/03/16 2115 06/03/16 2130  BP: 137/87 134/93 139/84 124/82  Pulse: 104 97 98 98  Resp: 21 21 21 24   Temp:      TempSrc:      SpO2: 99% 98% 99% 99%  Weight:      Height:          Constitutional: No acute distress, sleeping, sedated, rouses to sternal rub Eyes: PERTLA, lids and conjunctivae normal ENMT: Mucous membranes are moist. Posterior pharynx clear of any exudate or lesions.   Neck: normal, supple, no masses, no thyromegaly Respiratory: clear to auscultation bilaterally, no wheezing, no crackles. Normal respiratory effort.   Cardiovascular: S1 & S2 heard, regular rate and rhythm, grade 3 systolic murmur loudest at LUSB. No extremity edema. No significant JVD. Abdomen: No distension, no tenderness, no masses palpated. Bowel sounds normal.  Musculoskeletal: no clubbing / cyanosis. No joint deformity upper and lower extremities. Normal muscle tone.  Skin: no significant rashes, lesions, ulcers. Warm, dry, well-perfused. Neurologic: No gross facial asymmetry, PERRL. Moving all extremities spontaneously. Pharmacologically sedated.  Psychiatric: Difficult to assess secondary to heavy sedation.     Labs on Admission: I have personally reviewed following labs and imaging studies  CBC:  Recent Labs Lab 06/03/16 1658 06/03/16 1711  WBC 17.0*   --   NEUTROABS 15.3*  --   HGB 13.9 14.6  HCT 40.4 43.0  MCV 90.0  --   PLT 245  --    Basic Metabolic Panel:  Recent Labs Lab 06/03/16 1658 06/03/16 1711  NA 135 136  K 3.3* 3.2*  CL 103 104  CO2 21*  --   GLUCOSE 123* 126*  BUN 12 13  CREATININE 0.81 0.70  CALCIUM 9.1  --    GFR: Estimated Creatinine Clearance: 89.6 mL/min (by C-G formula based on SCr of 0.7 mg/dL). Liver Function Tests:  Recent Labs Lab 06/03/16 1658  AST 33  ALT 21  ALKPHOS 74  BILITOT 1.0  PROT 7.1  ALBUMIN 4.6   No results for input(s): LIPASE, AMYLASE in the last 168 hours. No results for input(s): AMMONIA in the last 168 hours. Coagulation Profile:  Recent Labs Lab 06/03/16 1658  INR 0.89   Cardiac Enzymes: No results for input(s): CKTOTAL, CKMB, CKMBINDEX, TROPONINI in the last 168 hours. BNP (last 3 results) No results  for input(s): PROBNP in the last 8760 hours. HbA1C: No results for input(s): HGBA1C in the last 72 hours. CBG:  Recent Labs Lab 06/03/16 1638  GLUCAP 154*   Lipid Profile: No results for input(s): CHOL, HDL, LDLCALC, TRIG, CHOLHDL, LDLDIRECT in the last 72 hours. Thyroid Function Tests: No results for input(s): TSH, T4TOTAL, FREET4, T3FREE, THYROIDAB in the last 72 hours. Anemia Panel: No results for input(s): VITAMINB12, FOLATE, FERRITIN, TIBC, IRON, RETICCTPCT in the last 72 hours. Urine analysis:    Component Value Date/Time   COLORURINE YELLOW 06/03/2016 2043   APPEARANCEUR CLEAR 06/03/2016 2043   LABSPEC 1.013 06/03/2016 2043   PHURINE 6.0 06/03/2016 2043   GLUCOSEU NEGATIVE 06/03/2016 2043   HGBUR NEGATIVE 06/03/2016 2043   [place] NEGATIVE 06/03/2016 2043   [place] NEGATIVE 06/03/2016 2043   PROTEINUR 30 (A) 06/03/2016 2043   UROBILINOGEN 0.2 05/08/2009 1027   NITRITE NEGATIVE 06/03/2016 2043   LEUKOCYTESUR NEGATIVE 06/03/2016 2043   Sepsis Labs: @LABRCNTIP (procalcitonin:4,lacticidven:4) )No results found for this or any previous  visit (from the past 240 hour(s)).   Radiological Exams on Admission: Ct Head Wo Contrast  Result Date: 06/03/2016 CLINICAL DATA:  Seizure EXAM: CT HEAD WITHOUT CONTRAST TECHNIQUE: Contiguous axial images were obtained from the base of the skull through the vertex without intravenous contrast. COMPARISON:  None FINDINGS: Brain: There is atrophy and chronic small vessel disease changes. No acute intracranial abnormality. Specifically, no hemorrhage, hydrocephalus, mass lesion, acute infarction, or significant intracranial injury. Vascular: No hyperdense vessel or unexpected calcification. Skull: No acute calvarial abnormality. Sinuses/Orbits: Mild mucosal thickening in the maxillary sinuses and scattered ethmoid air cells. No air-fluid levels. Other: None IMPRESSION: No acute intracranial abnormality. Atrophy, chronic microvascular disease. Electronically Signed   By: Rolm Baptise M.D.   On: 06/03/2016 21:08   Dg Chest Port 1 View  Result Date: 06/03/2016 CLINICAL DATA:  Recent seizure activity EXAM: PORTABLE CHEST 1 VIEW COMPARISON:  01/20/2015 FINDINGS: The heart size and mediastinal contours are within normal limits. Both lungs are clear. The visualized skeletal structures are unremarkable. IMPRESSION: No active disease. Electronically Signed   By: Inez Catalina M.D.   On: 06/03/2016 19:00    EKG: Independently reviewed. Sinus tachycardia (rate 105)   Assessment/Plan  1. Seizures  - Pt had 4 episodes of generalized convulsions today, lasting a couple minutes per report and followed by confusion  - Head CT is negative and no focal deficits elicited on exam  - He is reported to drink ~6 beers every day, but family notes that he can be secretive and hides things from his wife; they report only 1 beer last night; EtOH level <5 on presentation  - There is report of one prior seizure never evaluated by medical personnel   - He was treated with 5 mg IM Versed in the field with EMS, multiple doses of IV  Ativan in ED (IV was lost and not clear how much he actually received)  - Keppra 1 g was given in ED  - Neurology is consulting and much appreciated, will follow-up additional recommendations   2. Agitation  - Pt became agitated in ED, requiring security to restrain him, and multiple doses IV Ativan  - Pt not able to clearly communicate any specific concern or complaint  - Concern for an alcohol withdrawal state  - Family reports a hx of PTSD that may be contributing   3. Alcohol dependence  - Pt reportedly drinks 6 beers/day, maybe more   - Monitor with  CIWA and prn Ativan   - Supplement vitamins and minerals   4. Leukocytosis  - WBC elevated to 17,000 on admission - There is no fever; UA unremarkable and CXR clear; abd exam benign  - Likely stress-demargination secondary to seizures - Plan to culture if febrile    5. Hypokalemia  - Serum potassium 3.3 on admission and KCl is added to his IVF; he was given 1 g IV magnesium  - Repeat chem panel in am; pt is being monitored on telemetry    6. Hypertension  - BP was elevated to 155/110 on presentation, likely secondary to agitation and normalized with Ativan  - Managed with Norvasc at home; plan to resume once appropriate for a diet    7. GERD  - No EGD report on file - Managed with Prilosec at home, will continue PPI therapy    8. Elevated troponin  - Troponin is elevated to 0.35 - Denies pain; EKG without acute ischemic features  - Suspect this reflects a demand ischemia in the setting of repeated generalized seizures rather than ACS  - He will be given a full-dose ASA, monitored on telemetry for ischemic changes; trend troponin    DVT prophylaxis: sq Lovenox  Code Status: Full  Family Communication: None available at time of admission Disposition Plan: Admit to SDU  Consults called: Neurology  Admission status: Inpatient    Vianne Bulls, MD [facility] Pager [phone]  If 7PM-7AM, please contact  night-coverage [email or web address] Password [email or web address]  06/03/2016, 11:11 PM

## 2016-06-03 NOTE — ED Provider Notes (Addendum)
[facility] DEPT Provider Note   CSN: [id number] Arrival date & time: 06/03/16  1613     History   Chief Complaint Chief Complaint  Patient presents with  . Seizures    HPI Justin Sutton is a 71 y.o. male. Level V caveat secondary to patient confusion acuity of condition HPI 71 year old man history of regular alcohol intake presents today with confusion and family reports for seizures today. Initial history obtained from EMS and then wife and daughter at bedside. Wife reports that his first seizure was this morning at approximately 10:15 and was grand mal in nature. She thinks it lasted several minutes. He was confused afterwards. EMS was called and came to the house. He was confused and they were initially present. However they waited and he was then able to refuse transport. He then had 3 more seizures and she eventually called EMS again. She reports that he has had some difficulty walking recently with frequent falls. She states that he appeared to be in his usual state of health yesterday and as far she knows only drank 1 drink last night. However, she states that he does hide history keep from her. She reports one seizure in the past which was not evaluated by medical care providers. Past Medical History:  Diagnosis Date  . Arthritis   . Chronic cough   . Essential hypertension 02/14/2015  . GERD (gastroesophageal reflux disease)   . H/O asbestos exposure   . Hearing loss   . Heart murmur   . Hyperlipidemia   . Hypertension   . Nocturia   . Numbness    comes and goes per right hand   . OSA (obstructive sleep apnea)   . Thoracic aortic aneurysm without rupture ([place]) 02/14/2015   4.1 cm 01/2015    Patient Active Problem List   Diagnosis Date Noted  . OA (osteoarthritis) of knee 04/14/2015  . Essential hypertension 02/14/2015  . Hyperlipidemia 02/14/2015  . Aortic stenosis, mild 02/14/2015  . Thoracic aortic aneurysm without rupture ([place]) 02/14/2015  . Asbestos  exposure 09/14/2012  . Cough 09/12/2012    Past Surgical History:  Procedure Laterality Date  . CATARACT EXTRACTION  2011   bilat   . HAND SURGERY Right 1983  . HERNIA REPAIR  2011  . TONSILLECTOMY    . TOTAL KNEE ARTHROPLASTY Left 2011  . TOTAL KNEE ARTHROPLASTY Right 04/14/2015   Procedure: RIGHT TOTAL KNEE ARTHROPLASTY;  Surgeon: Gaynelle Arabian, MD;  Location: [facility];  Service: Orthopedics;  Laterality: Right;       Home Medications    Prior to Admission medications   Medication Sig Start Date End Date Taking? Authorizing Provider  amLODipine (NORVASC) 2.5 MG tablet Take 2.5 mg by mouth daily.    Historical Provider, MD  atorvastatin (LIPITOR) 40 MG tablet Take 40 mg by mouth daily.    Historical Provider, MD  methocarbamol (ROBAXIN) 500 MG tablet Take 1 tablet (500 mg total) by mouth every 6 (six) hours as needed for muscle spasms. 04/15/15   Alexzandrew L Perkins, PA-C  oxyCODONE (OXY IR/ROXICODONE) 5 MG immediate release tablet Take 1-2 tablets (5-10 mg total) by mouth every 3 (three) hours as needed for moderate pain or severe pain. 04/15/15   Alexzandrew L Perkins, PA-C  ranitidine (ZANTAC) 150 MG capsule Use up to every 12 h as needed for heartburn Patient taking differently: Take 150 mg by mouth daily.  10/30/12   Tanda Rockers, MD  rivaroxaban (XARELTO) 10 MG TABS tablet Take  1 tablet (10 mg total) by mouth daily with breakfast. Take Xarelto for two and a half more weeks, then discontinue Xarelto. Once the patient has completed the Xarelto, they may resume the 81 mg Aspirin. 04/15/15   Alexzandrew L Perkins, PA-C  traMADol (ULTRAM) 50 MG tablet Take 1-2 tablets (50-100 mg total) by mouth every 6 (six) hours as needed (mild pain). 04/15/15   Alexzandrew Monika Salk, PA-C    Family History Family History  Problem Relation Age of Onset  . Asthma Mother   . Heart disease Father   . Prostate cancer Father     Social History Social History  Substance Use Topics  . Smoking  status: Former Smoker    Packs/day: 3.00    Years: 15.00    Types: Cigarettes    Quit date: 04/05/1970  . Smokeless tobacco: Never Used  . Alcohol use Yes     Comment: beer daily 2 per day      Allergies   Patient has no known allergies.   Review of Systems Review of Systems  Unable to perform ROS: Mental status change     Physical Exam Updated Vital Signs BP (!) 155/110 (BP Location: Right Arm)   Pulse (!) 123   Temp 98.7 F (37.1 C) (Rectal)   Resp 26   Ht 5\' 11"  (1.803 m)   Wt 74.8 kg   SpO2 94%   BMI 23.01 kg/m   Physical Exam  Constitutional: He appears well-developed and well-nourished. He appears distressed.  HENT:  Head: Normocephalic and atraumatic.  Right Ear: External ear normal.  Left Ear: External ear normal.  Mouth/Throat: Oropharynx is clear and moist.  Eyes: EOM are normal.  Neck: Normal range of motion.  Cardiovascular: Normal rate and regular rhythm.   Pulmonary/Chest: Effort normal and breath sounds normal.  Abdominal: Soft.  Musculoskeletal: Normal range of motion.  Neurological: No cranial nerve deficit. He exhibits normal muscle tone. Coordination normal.  Patient very confused but is moving all 4 extremities equally. He speaks clearly identifying his wife and requesting to leave. However, he does not know where he is, time or place.  Skin: Skin is warm. Capillary refill takes less than 2 seconds.  Nursing note and vitals reviewed.    ED Treatments / Results  Labs (all labs ordered are listed, but only abnormal results are displayed) Labs Reviewed  CBG MONITORING, ED - Abnormal; Notable for the following:       Result Value   Glucose-Capillary 154 (*)    All other components within normal limits  ETHANOL  PROTIME-INR  APTT  CBC  DIFFERENTIAL  COMPREHENSIVE METABOLIC PANEL  RAPID URINE DRUG SCREEN, HOSP PERFORMED  URINALYSIS, ROUTINE W REFLEX MICROSCOPIC  I-STAT CHEM 8, ED  I-STAT TROPOININ, ED    EKG  EKG  Interpretation  Date/Time:  Thursday June 03 2016 17:21:16 EST Ventricular Rate:  105 PR Interval:    QRS Duration: 96 QT Interval:  371 QTC Calculation: 491 R Axis:   82 Text Interpretation:  Sinus tachycardia Borderline prolonged PR interval Borderline right axis deviation Borderline prolonged QT interval Confirmed by Taven Strite MD, Shailyn Weyandt ([id number]) on 06/03/2016 5:49:28 PM       Radiology Ct Head Wo Contrast  Result Date: 06/03/2016 CLINICAL DATA:  Seizure EXAM: CT HEAD WITHOUT CONTRAST TECHNIQUE: Contiguous axial images were obtained from the base of the skull through the vertex without intravenous contrast. COMPARISON:  None FINDINGS: Brain: There is atrophy and chronic small vessel disease changes. No acute  intracranial abnormality. Specifically, no hemorrhage, hydrocephalus, mass lesion, acute infarction, or significant intracranial injury. Vascular: No hyperdense vessel or unexpected calcification. Skull: No acute calvarial abnormality. Sinuses/Orbits: Mild mucosal thickening in the maxillary sinuses and scattered ethmoid air cells. No air-fluid levels. Other: None IMPRESSION: No acute intracranial abnormality. Atrophy, chronic microvascular disease. Electronically Signed   By: Rolm Baptise M.D.   On: 06/03/2016 21:08   Dg Chest Port 1 View  Result Date: 06/03/2016 CLINICAL DATA:  Recent seizure activity EXAM: PORTABLE CHEST 1 VIEW COMPARISON:  01/20/2015 FINDINGS: The heart size and mediastinal contours are within normal limits. Both lungs are clear. The visualized skeletal structures are unremarkable. IMPRESSION: No active disease. Electronically Signed   By: Inez Catalina M.D.   On: 06/03/2016 19:00    Procedures Procedures (including critical care time)  Medications Ordered in ED Medications  LORazepam (ATIVAN) injection 2 mg (not administered)  levETIRAcetam (KEPPRA) 1,000 mg in sodium chloride 0.9 % 100 mL IVPB (1,000 mg Intravenous New Bag/Given 06/03/16 1702)  LORazepam (ATIVAN) 2  MG/ML injection (2 mg  Given 06/03/16 1630)     Initial Impression / Assessment and Plan / ED Course  I have reviewed the triage vital signs and the nursing notes.  Pertinent labs & imaging results that were available during my care of the patient were reviewed by me and considered in my medical decision making (see chart for details).   this is  a 71 year old man who presents today with new onset of seizures and prolonged postictal phase versus delirium.  Differential diagnosis of seizures occurred following abnormalities with basic labs. Normal. There is a question of alcohol abuse with daughter stating that he drinks 6 beers today and why stating that she doesn't think he drinks that much at one time, but other times she states that he tends to sneak alcohol.  Not report any definite previous alcohol withdrawal seizures although he had 1 seizure back in the fall. He has had multiple falls. However, head CT does not show any evidence of bleed or enlarged ventricles. He is not febrile and does not have any nuchal rigidity. I doubt infection as the source. Patient was initially very agitated on my evaluation he required IV Ativan which controlled his seizures however he became agitated on our attempts to move to the CT table. He then required several more doses of Ativan. He is resting comfortably on the monitor with good oxygen saturations and airway control after this. I spoke with Dr. Alvy Beal on call for neuro hospitalist and he is consulting on the patient. I spoke with Dr.Opyd on call for hospitalist and patient will be admitted to step down unit. CRITICAL CARE Performed by: Shaune Pollack Total critical care time: 75 minutes Critical care time was exclusive of separately billable procedures and treating other patients. Critical care was necessary to treat or prevent imminent or life-threatening deterioration. Critical care was time spent personally by me on the following activities: development  of treatment plan with patient and/or surrogate as well as nursing, discussions with consultants, evaluation of patient's response to treatment, examination of patient, obtaining history from patient or surrogate, ordering and performing treatments and interventions, ordering and review of laboratory studies, ordering and review of radiographic studies, pulse oximetry and re-evaluation of patient's condition.   Final Clinical Impressions(s) / ED Diagnoses   Final diagnoses:  Seizures ([place])  Status epilepticus ([place])  Altered mental status, unspecified altered mental status type    New Prescriptions New Prescriptions   No  medications on file     Pattricia Boss, MD 06/04/16 0005    Pattricia Boss, MD 06/04/16 0005

## 2016-06-04 ENCOUNTER — Inpatient Hospital Stay (HOSPITAL_COMMUNITY): Payer: Medicare Other

## 2016-06-04 ENCOUNTER — Encounter (HOSPITAL_COMMUNITY): Payer: Self-pay | Admitting: Family Medicine

## 2016-06-04 DIAGNOSIS — R4182 Altered mental status, unspecified: Secondary | ICD-10-CM

## 2016-06-04 DIAGNOSIS — F10931 Alcohol use, unspecified with withdrawal delirium: Secondary | ICD-10-CM

## 2016-06-04 DIAGNOSIS — F10231 Alcohol dependence with withdrawal delirium: Secondary | ICD-10-CM

## 2016-06-04 DIAGNOSIS — R778 Other specified abnormalities of plasma proteins: Secondary | ICD-10-CM | POA: Diagnosis present

## 2016-06-04 DIAGNOSIS — R7989 Other specified abnormal findings of blood chemistry: Secondary | ICD-10-CM

## 2016-06-04 LAB — MRSA PCR SCREENING: MRSA BY PCR: NEGATIVE

## 2016-06-04 LAB — CK TOTAL AND CKMB (NOT AT ARMC)
CK TOTAL: 1013 U/L — AB (ref 49–397)
CK, MB: 23.9 ng/mL — AB (ref 0.5–5.0)
CK, MB: 20.6 ng/mL — ABNORMAL HIGH (ref 0.5–5.0)
CK, MB: 18.7 ng/mL — ABNORMAL HIGH (ref 0.5–5.0)
RELATIVE INDEX: 4.2 — AB (ref 0.0–2.5)
Relative Index: 2.5 (ref 0.0–2.5)
Relative Index: 1.8 (ref 0.0–2.5)
Total CK: 566 U/L — ABNORMAL HIGH (ref 49–397)
Total CK: 812 U/L — ABNORMAL HIGH (ref 49–397)

## 2016-06-04 LAB — CBC
HEMATOCRIT: 39.8 % (ref 39.0–52.0)
Hemoglobin: 13.6 g/dL (ref 13.0–17.0)
MCH: 31.1 pg (ref 26.0–34.0)
MCHC: 34.2 g/dL (ref 30.0–36.0)
MCV: 91.1 fL (ref 78.0–100.0)
Platelets: 236 10*3/uL (ref 150–400)
RBC: 4.37 MIL/uL (ref 4.22–5.81)
RDW: 13.5 % (ref 11.5–15.5)
WBC: 11.3 10*3/uL — ABNORMAL HIGH (ref 4.0–10.5)

## 2016-06-04 LAB — COMPREHENSIVE METABOLIC PANEL
ALBUMIN: 4 g/dL (ref 3.5–5.0)
ALK PHOS: 67 U/L (ref 38–126)
ALT: 21 U/L (ref 17–63)
AST: 43 U/L — AB (ref 15–41)
Anion gap: 11 (ref 5–15)
BILIRUBIN TOTAL: 1.9 mg/dL — AB (ref 0.3–1.2)
BUN: 9 mg/dL (ref 6–20)
CO2: 22 mmol/L (ref 22–32)
Calcium: 8.9 mg/dL (ref 8.9–10.3)
Chloride: 104 mmol/L (ref 101–111)
Creatinine, Ser: 0.82 mg/dL (ref 0.61–1.24)
GFR calc Af Amer: >60 mL/min (ref >60)
GFR calc non Af Amer: >60 mL/min (ref >60)
GLUCOSE: 126 mg/dL — AB (ref 65–99)
POTASSIUM: 3.3 mmol/L — AB (ref 3.5–5.1)
Sodium: 137 mmol/L (ref 135–145)
TOTAL PROTEIN: 6.6 g/dL (ref 6.5–8.1)

## 2016-06-04 LAB — TROPONIN I
TROPONIN I: 0.16 ng/mL — AB (ref <0.03)
Troponin I: 0.35 ng/mL (ref <0.03)
Troponin I: 0.26 ng/mL (ref <0.03)
Troponin I: 0.18 ng/mL (ref <0.03)

## 2016-06-04 LAB — TSH: TSH: 2.087 u[IU]/mL (ref 0.350–4.500)

## 2016-06-04 LAB — AMMONIA: AMMONIA: 27 umol/L (ref 9–35)

## 2016-06-04 LAB — MAGNESIUM: MAGNESIUM: 2.3 mg/dL (ref 1.7–2.4)

## 2016-06-04 LAB — GLUCOSE, CAPILLARY: GLUCOSE-CAPILLARY: 100 mg/dL — AB (ref 65–99)

## 2016-06-04 MED ORDER — PANTOPRAZOLE SODIUM 40 MG IV SOLR
40.0000 mg | INTRAVENOUS | Status: DC
  Administered 2016-06-04 – 2016-06-06 (×3): 40 mg via INTRAVENOUS
  Filled 2016-06-04 (×3): qty 40

## 2016-06-04 MED ORDER — HYDRALAZINE HCL 20 MG/ML IJ SOLN
10.0000 mg | INTRAMUSCULAR | Status: DC | PRN
  Administered 2016-06-05: 10 mg via INTRAVENOUS
  Filled 2016-06-04: qty 1

## 2016-06-04 MED ORDER — KCL IN DEXTROSE-NACL 20-5-0.9 MEQ/L-%-% IV SOLN
INTRAVENOUS | Status: DC
  Administered 2016-06-04: 14:00:00 via INTRAVENOUS
  Filled 2016-06-04 (×3): qty 1000

## 2016-06-04 MED ORDER — ASPIRIN 300 MG RE SUPP
300.0000 mg | Freq: Once | RECTAL | Status: AC
  Administered 2016-06-04: 300 mg via RECTAL
  Filled 2016-06-04: qty 1

## 2016-06-04 MED ORDER — MORPHINE SULFATE (PF) 2 MG/ML IV SOLN: 1.0000 mg | INTRAVENOUS | Status: DC | PRN

## 2016-06-04 MED ORDER — LABETALOL HCL 5 MG/ML IV SOLN
5.0000 mg | INTRAVENOUS | Status: DC | PRN
  Administered 2016-06-05 (×2): 10 mg via INTRAVENOUS
  Filled 2016-06-04 (×2): qty 4

## 2016-06-04 NOTE — Progress Notes (Signed)
Pt transported to MRI with this RN, received 2mg  Ativan immediately before transport.  Pt became restless when moved from bed in MRI and test was unable to be completed due to constant motion of pt.  May need sedating medications in order to complete MRI brain.

## 2016-06-04 NOTE — Evaluation (Signed)
Clinical/Bedside Swallow Evaluation Patient Details  Name: Justin Sutton MRN: [id number] Date of Birth: Apr 23, 1945  Today's Date: 06/04/2016 Time: SLP Start Time (ACUTE ONLY): 24 SLP Stop Time (ACUTE ONLY): 1138 SLP Time Calculation (min) (ACUTE ONLY): 8 min  Past Medical History:  Past Medical History:  Diagnosis Date  . Arthritis   . Chronic cough   . Essential hypertension 02/14/2015  . GERD (gastroesophageal reflux disease)   . H/O asbestos exposure   . Hearing loss   . Heart murmur   . Hyperlipidemia   . Hypertension   . Nocturia   . Numbness    comes and goes per right hand   . OSA (obstructive sleep apnea)   . Thoracic aortic aneurysm without rupture ([place]) 02/14/2015   4.1 cm 01/2015   Past Surgical History:  Past Surgical History:  Procedure Laterality Date  . CATARACT EXTRACTION  2011   bilat   . HAND SURGERY Right 1983  . HERNIA REPAIR  2011  . TONSILLECTOMY    . TOTAL KNEE ARTHROPLASTY Left 2011  . TOTAL KNEE ARTHROPLASTY Right 04/14/2015   Procedure: RIGHT TOTAL KNEE ARTHROPLASTY;  Surgeon: Gaynelle Arabian, MD;  Location: [facility];  Service: Orthopedics;  Laterality: Right;   HPI:  Justin Sutton a 71 y.o.malewith medical history significant for hypertension, hyperlipidemia, GERD, and alcohol dependence who presents to the emergency department after 4 witnessed episodes of apparent generalized seizure activity. Patient's family reports a episode of generalized convulsions at approximately 10:15 AM that lasted for several minutes and was followed by confusion. CXR and CT negative. Pt requiring Ativan due to combative behavior.    Assessment / Plan / Recommendation Clinical Impression  Pt is significantly agitated during assessment, but willing to take PO trials that were offered. He showed no signs of dysphagia and has no prior history of dysphagia. His CXR is clear. Given adequate arousal despite sedating medications as well as functional ability to  consume PO, recommend a dys 2 (fine chopped) diet as his dentures are not available and he always wears them to eat. Diet may be upgraded per family wishes. No further SLP interventions needed.  SLP Visit Diagnosis: Dysphagia, oral phase (R13.11)    Aspiration Risk  Mild aspiration risk    Diet Recommendation Dysphagia 2 (Fine chop);Thin liquid   Liquid Administration via: Cup;Straw Medication Administration: Whole meds with liquid Supervision: Staff to assist with self feeding Postural Changes: Seated upright at 90 degrees    Other  Recommendations     Follow up Recommendations None      Frequency and Duration            Prognosis        Swallow Study   General HPI: Justin Sutton a 71 y.o.malewith medical history significant for hypertension, hyperlipidemia, GERD, and alcohol dependence who presents to the emergency department after 4 witnessed episodes of apparent generalized seizure activity. Patient's family reports a episode of generalized convulsions at approximately 10:15 AM that lasted for several minutes and was followed by confusion. CXR and CT negative. Pt requiring Ativan due to combative behavior.  Type of Study: Bedside Swallow Evaluation Diet Prior to this Study: NPO Temperature Spikes Noted: No Respiratory Status: Room air History of Recent Intubation: No Behavior/Cognition: Alert;Confused;Agitated Oral Care Completed by SLP: No Oral Cavity - Dentition: Edentulous;Dentures, not available Self-Feeding Abilities: Total assist (pt is restrained, combative) Patient Positioning: Partially reclined;Postural control interferes with function Baseline Vocal Quality: Normal Volitional Cough: Strong  Oral/Motor/Sensory Function Overall Oral Motor/Sensory Function: Within functional limits   Ice Chips     Thin Liquid Thin Liquid: Within functional limits Presentation: Straw    Nectar Thick Nectar Thick Liquid: Not tested   Honey Thick Honey Thick Liquid:  Not tested   Puree Puree: Within functional limits   Solid   GO   Solid: Not tested       [name], MA CCC-SLP [id number]  [facility], Katherene Ponto 06/04/2016,11:59 AM

## 2016-06-04 NOTE — Progress Notes (Signed)
EEG completed, results pending. 

## 2016-06-04 NOTE — Progress Notes (Signed)
Pt transferred from ED to 4E17 after report.  Pt lethargic upon arrival, but became extremely restless, combative, attempting to get out of bed, pull at lines.  CIWA protocol followed, 3mg  ativan given and Dr. Reesa Chew notified of pt condition.  Restraints ordered.  Will continue to monitor and update.

## 2016-06-04 NOTE — Progress Notes (Signed)
PROGRESS NOTE    Justin Sutton  [id number] DOB: 22-Jun-1945 DOA: 06/03/2016 PCP: Lujean Amel, MD    Brief Narrative:  71 yo male with etho abuse, presents with seizure activity, generalized seizures x7, associated with confusion and agitation. On the initial examination patient awake but not following commands. Admitted to the step down unit, for severe withdrawal symptoms.    Assessment & Plan:   Principal Problem:   Seizure [facility]) Active Problems:   Essential hypertension   Hyperlipidemia   Thoracic aortic aneurysm without rupture (HCC)   Alcohol dependence (HCC)   Hypokalemia   Leukocytosis   Frequent falls   Chronic pain   Seizures (HCC)   Elevated troponin I level   1. Severe alcohol withdrawals. Will continue neuro checks per unit protocol, continue on physical restrains, will add weist belt. Continue benzodiazepines per protocol and will add morphine as needed. Patient very agitated and combative.   2. Seizures. Likely related to withdrawal from alcohol, will continue benzodiazepines, aspiration precautions. Discussed case with neurology will hold on keppra for now.    3. Metabolic encephalopathy. Due to severe withdrawal syndrome, will continue benzodiazepines per protocol, aspiration precautions, will change IV fluids to dextrose, continue multivitamins, folic acid and thiamine.   4. Hypokalemia. k down to 3,3 will follow on renal panel in am, continue repletion, keep k at 4. Noted mg at 2,3 with a preserved renal function, cr at 0,82.   5. Elevated CPK. Will follow on cpk in am, noted 566, patient at high risk for rhabdomyolisis.   6. HTN. Continue as needed labetalol. Patient NPO.      DVT prophylaxis: enoxaparin  Code Status: full  Family Communication: I spoke with patient's wife at the bedside and all questions were addressed.  Disposition Plan: home   Consultants:   Neurology   Procedures:   Antimicrobials:    Subjective: Patient  agitated, unable to give any history. All information from patient's wife and nursing. It is suspected patient consumes more alcohol that what is reported. On restrains and getting benzodiazepines, still very agitated.   Objective: Vitals:   06/04/16 0400 06/04/16 0402 06/04/16 0415 06/04/16 0810  BP: (!) 151/75  (!) 151/75 134/90  Pulse:   90 87  Resp:   (!) 23 19  Temp:  97.5 F (36.4 C)  97.9 F (36.6 C)  TempSrc:  Axillary  Oral  SpO2:   95% 100%  Weight:  71.2 kg (157 lb)    Height:  5\' 11"  (1.803 m)      Intake/Output Summary (Last 24 hours) at 06/04/16 1143 Last data filed at 06/04/16 0700  Gross per 24 hour  Intake           856.67 ml  Output                0 ml  Net           856.67 ml   Filed Weights   06/03/16 1642 06/04/16 0402  Weight: 74.8 kg (165 lb) 71.2 kg (157 lb)    Examination:  General exam: deconditioned, agitated and confused E ENT: positive pallor, oral mucosa dry, no icterus Respiratory system: Mild decreased breath sounds at bases. Respiratory effort normal. Cardiovascular system: S1 & S2 heard, RRR. No JVD, murmurs, rubs, gallops or clicks. No pedal edema. Gastrointestinal system: Abdomen is nondistended, soft and nontender. No organomegaly or masses felt. Normal bowel sounds heard. Central nervous system: awake, confused and disorientated. Agitated.   Extremities: strength preserved,  not following commands.  Skin: No rashes, lesions or ulcers  Data Reviewed: I have personally reviewed following labs and imaging studies  CBC:  Recent Labs Lab 06/03/16 1658 06/03/16 1711 06/04/16 0418  WBC 17.0*  --  11.3*  NEUTROABS 15.3*  --   --   HGB 13.9 14.6 13.6  HCT 40.4 43.0 39.8  MCV 90.0  --  91.1  PLT 245  --  [id number]   Basic Metabolic Panel:  Recent Labs Lab 06/03/16 1658 06/03/16 1711 06/04/16 0418  NA 135 136 137  K 3.3* 3.2* 3.3*  CL 103 104 104  CO2 21*  --  22  GLUCOSE 123* 126* 126*  BUN 12 13 9   CREATININE 0.81 0.70 0.82    CALCIUM 9.1  --  8.9  MG  --   --  2.3   GFR: Estimated Creatinine Clearance: 83.2 mL/min (by C-G formula based on SCr of 0.82 mg/dL). Liver Function Tests:  Recent Labs Lab 06/03/16 1658 06/04/16 0418  AST 33 43*  ALT 21 21  ALKPHOS 74 67  BILITOT 1.0 1.9*  PROT 7.1 6.6  ALBUMIN 4.6 4.0   No results for input(s): LIPASE, AMYLASE in the last 168 hours. No results for input(s): AMMONIA in the last 168 hours. Coagulation Profile:  Recent Labs Lab 06/03/16 1658  INR 0.89   Cardiac Enzymes:  Recent Labs Lab 06/03/16 2323 06/04/16 0418 06/04/16 0812  CKTOTAL  --   --  566*  CKMB  --   --  23.9*  TROPONINI 0.35* 0.26*  --    BNP (last 3 results) No results for input(s): PROBNP in the last 8760 hours. HbA1C: No results for input(s): HGBA1C in the last 72 hours. CBG:  Recent Labs Lab 06/03/16 1638  GLUCAP 154*   Lipid Profile: No results for input(s): CHOL, HDL, LDLCALC, TRIG, CHOLHDL, LDLDIRECT in the last 72 hours. Thyroid Function Tests: No results for input(s): TSH, T4TOTAL, FREET4, T3FREE, THYROIDAB in the last 72 hours. Anemia Panel: No results for input(s): VITAMINB12, FOLATE, FERRITIN, TIBC, IRON, RETICCTPCT in the last 72 hours. Sepsis Labs: No results for input(s): PROCALCITON, LATICACIDVEN in the last 168 hours.  Recent Results (from the past 240 hour(s))  MRSA PCR Screening     Status: None   Collection Time: 06/04/16  4:00 AM  Result Value Ref Range Status   MRSA by PCR NEGATIVE NEGATIVE Final    Comment:        The GeneXpert MRSA Assay (FDA approved for NASAL specimens only), is one component of a comprehensive MRSA colonization surveillance program. It is not intended to diagnose MRSA infection nor to guide or monitor treatment for MRSA infections.          Radiology Studies: Ct Head Wo Contrast  Result Date: 06/03/2016 CLINICAL DATA:  Seizure EXAM: CT HEAD WITHOUT CONTRAST TECHNIQUE: Contiguous axial images were obtained from  the base of the skull through the vertex without intravenous contrast. COMPARISON:  None FINDINGS: Brain: There is atrophy and chronic small vessel disease changes. No acute intracranial abnormality. Specifically, no hemorrhage, hydrocephalus, mass lesion, acute infarction, or significant intracranial injury. Vascular: No hyperdense vessel or unexpected calcification. Skull: No acute calvarial abnormality. Sinuses/Orbits: Mild mucosal thickening in the maxillary sinuses and scattered ethmoid air cells. No air-fluid levels. Other: None IMPRESSION: No acute intracranial abnormality. Atrophy, chronic microvascular disease. Electronically Signed   By: Rolm Baptise M.D.   On: 06/03/2016 21:08   Dg Chest Port 1 View  Result Date:  06/03/2016 CLINICAL DATA:  Recent seizure activity EXAM: PORTABLE CHEST 1 VIEW COMPARISON:  01/20/2015 FINDINGS: The heart size and mediastinal contours are within normal limits. Both lungs are clear. The visualized skeletal structures are unremarkable. IMPRESSION: No active disease. Electronically Signed   By: Inez Catalina M.D.   On: 06/03/2016 19:00        Scheduled Meds: . amLODipine  2.5 mg Oral Daily  . aspirin  81 mg Oral Daily  . atorvastatin  40 mg Oral Daily  . enoxaparin (LOVENOX) injection  40 mg Subcutaneous Daily  . folic acid  1 mg Intravenous Daily  . multivitamin with minerals  1 tablet Oral Daily  . pantoprazole  40 mg Oral Daily  . sodium chloride flush  3 mL Intravenous Q12H  . thiamine  100 mg Intravenous Daily   Continuous Infusions: . 0.9 % NaCl with KCl 40 mEq / L 100 mL/hr (06/04/16 0032)     LOS: 1 day       Armine Rizzolo Gerome Apley, MD [facility] Pager [phone]  If 7PM-7AM, please contact night-coverage [email or web address] Password [facility] 06/04/2016, 11:43 AM

## 2016-06-04 NOTE — Procedures (Signed)
History:  71 year old male with altered mental status  Sedation: Ativan  Technique: This is a 21 channel routine scalp EEG performed at the bedside with bipolar and monopolar montages arranged in accordance to the international 10/20 system of electrode placement. One channel was dedicated to EKG recording.    Background: There is excessive beta activity. Throughout the study, whenever the patient is awake, he is writhing, but no definite abnormalities seen. He has a posterior dominant rhythm of 10 Hz. Sleep is recorded with normal appearing bilaterally symmetric structures.  Photic stimulation: Physiologic driving is not performed  EEG Abnormalities: Excessive beta activity  Clinical Interpretation: This normal EEG is recorded in the waking and sleep state. The excessive beta activity is associated with medication effect. There was no seizure or seizure predisposition recorded on this study. Please note that a normal EEG does not preclude the possibility of epilepsy.   Roland Rack, MD [facility] [phone]  If 7pm- 7am, please page neurology on call as listed in [place].

## 2016-06-04 NOTE — Progress Notes (Addendum)
Subjective: Patient currently is in 4-point restraints and extremely agitated.  He is very confused and follows no commands. Long discussion with family was had the believe he only drinks about 6 beers a day apparently he had quit approximate 16 years ago but then started up recently again. They state that he drinks he currently usually while he is in his workplace and they're unclear if he drinks a heart alcohol. He was marijuana + when he was admitted.   Exam: Vitals:   06/04/16 0415 06/04/16 0810  BP: (!) 151/75 134/90  Pulse: 90 87  Resp: (!) 23 19  Temp:  97.9 F (36.6 C)    HEENT-  Normocephalic, no lesions, without obvious abnormality.  Normal external eye and conjunctiva.  Normal TM's bilaterally.  Normal auditory canals and external ears. Normal external nose, mucus membranes and septum.  Normal pharynx.     Gen: In bed, NAD MS: Agitated and follows no commands CN: 2 through 12 intact Motor: In 4-point restraint moving all extremities with 5/5 strength Sensory: Grossly intact   Pertinent Labs/Diagnostics: None  Etta Quill PA-C [facility] [phone]  He denies headache. For me, he does answer questions and follow commands. Knows the month/year, but not where he is.   Impression: 71 year old male with signs and symptoms that are by far most consistent with EtOH withdrawel. His wife states that she notices him wobbly a few times per week, but thinks that he only has 1 - 2 beer swhen she notices him like this. She does state that hee hides it from her, however. Afebrlie, WBC trendigndown, suspect due to seizure.    Recommendations: 1) Continue CIWA 2) Agree with thiamine 3) will send TSH, ammonia as well.  4) EEG pending   Roland Rack, MD [facility] [phone]  If 7pm- 7am, please page neurology on call as listed in [place].  06/04/2016, 11:22 AM

## 2016-06-05 ENCOUNTER — Inpatient Hospital Stay (HOSPITAL_COMMUNITY): Payer: Medicare Other

## 2016-06-05 DIAGNOSIS — G8929 Other chronic pain: Secondary | ICD-10-CM

## 2016-06-05 DIAGNOSIS — T796XXA Traumatic ischemia of muscle, initial encounter: Secondary | ICD-10-CM

## 2016-06-05 LAB — CBC WITH DIFFERENTIAL/PLATELET
BASOS ABS: 0 10*3/uL (ref 0.0–0.1)
BASOS PCT: 0 %
EOS ABS: 0.3 10*3/uL (ref 0.0–0.7)
Eosinophils Relative: 4 %
HCT: 41.7 % (ref 39.0–52.0)
HEMOGLOBIN: 14.2 g/dL (ref 13.0–17.0)
Lymphocytes Relative: 18 %
Lymphs Abs: 1.4 10*3/uL (ref 0.7–4.0)
MCH: 31.3 pg (ref 26.0–34.0)
MCHC: 34.1 g/dL (ref 30.0–36.0)
MCV: 91.9 fL (ref 78.0–100.0)
Monocytes Absolute: 0.7 10*3/uL (ref 0.1–1.0)
Monocytes Relative: 9 %
NEUTROS PCT: 69 %
Neutro Abs: 5.4 10*3/uL (ref 1.7–7.7)
Platelets: 215 10*3/uL (ref 150–400)
RBC: 4.54 MIL/uL (ref 4.22–5.81)
RDW: 13.3 % (ref 11.5–15.5)
WBC: 7.7 10*3/uL (ref 4.0–10.5)

## 2016-06-05 LAB — BASIC METABOLIC PANEL
ANION GAP: 11 (ref 5–15)
BUN: 8 mg/dL (ref 6–20)
CALCIUM: 9.1 mg/dL (ref 8.9–10.3)
CO2: 21 mmol/L — ABNORMAL LOW (ref 22–32)
CREATININE: 0.63 mg/dL (ref 0.61–1.24)
Chloride: 109 mmol/L (ref 101–111)
GFR calc non Af Amer: >60 mL/min (ref >60)
Glucose, Bld: 113 mg/dL — ABNORMAL HIGH (ref 65–99)
Potassium: 4 mmol/L (ref 3.5–5.1)
SODIUM: 141 mmol/L (ref 135–145)

## 2016-06-05 LAB — GLUCOSE, CAPILLARY: GLUCOSE-CAPILLARY: 129 mg/dL — AB (ref 65–99)

## 2016-06-05 LAB — CK TOTAL AND CKMB (NOT AT ARMC)
CK TOTAL: 1021 U/L — AB (ref 49–397)
CK TOTAL: 1072 U/L — AB (ref 49–397)
CK, MB: 18.7 ng/mL — AB (ref 0.5–5.0)
CK, MB: 13.2 ng/mL — ABNORMAL HIGH (ref 0.5–5.0)
CK, MB: 16 ng/mL — AB (ref 0.5–5.0)
CK, MB: 8.9 ng/mL — ABNORMAL HIGH (ref 0.5–5.0)
RELATIVE INDEX: 0.7 (ref 0.0–2.5)
RELATIVE INDEX: 1.8 (ref 0.0–2.5)
Relative Index: 1.5 (ref 0.0–2.5)
Relative Index: 1.2 (ref 0.0–2.5)
Total CK: 1139 U/L — ABNORMAL HIGH (ref 49–397)
Total CK: 1202 U/L — ABNORMAL HIGH (ref 49–397)

## 2016-06-05 MED ORDER — DEXTROSE IN LACTATED RINGERS 5 % IV SOLN
INTRAVENOUS | Status: DC
  Administered 2016-06-05 – 2016-06-06 (×2): via INTRAVENOUS

## 2016-06-05 MED ORDER — AMLODIPINE BESYLATE 10 MG PO TABS
10.0000 mg | ORAL_TABLET | Freq: Every day | ORAL | Status: DC
  Administered 2016-06-05 – 2016-06-07 (×3): 10 mg via ORAL
  Filled 2016-06-05 (×4): qty 1

## 2016-06-05 NOTE — Progress Notes (Addendum)
Subjective: Markedly improved today  Exam: Vitals:   06/05/16 1615 06/05/16 1645  BP: (!) 159/107 122/85  Pulse: 92   Resp: 20   Temp:     Gen: In bed, NAD Resp: non-labored breathing, no acute distress Abd: soft, nt  Neuro: MS: Awake, alert, oriented to person place and time. Fixates on going home. CN: Visual fields full, extra ocular movements intact Motor: Moves all extremities well with 5/5 strength Sensory: Intact to light touch  Pertinent Labs: TSH, ammonia-normal  Impression: 71 yo M with Seizure and agitated delirium. This clinically has looked very much like EtOH withdrawal, but now that he is clear, he continues to state that he only drinks 2 beers per day. Family agrees. Though it is possible that he is being not forthcoming regarding his drinking, I do think that further workup would be indicated. With his mental status improving, I do not think an LP is necessary especially since he is on prophylactic blood thinners. I would still favor getting the MRI, however.  Recommendations: 1) MRI brain 2) continue thiamine  Roland Rack, MD [facility] [phone]  If 7pm- 7am, please page neurology on call as listed in [place].

## 2016-06-05 NOTE — Progress Notes (Signed)
PROGRESS NOTE    POLLUX EYE  [id number] DOB: 07-Sep-1945 DOA: 06/03/2016 PCP: Lujean Amel, MD    Brief Narrative:  71 yo male with etho abuse, presents with seizure activity, generalized seizures x7, associated with confusion and agitation. On the initial examination patient awake but not following commands. Admitted to the step down unit, for severe withdrawal symptoms. Responding to benzodiazepines IV per protocol, still on restrains. Very depressed at home before admission.   Assessment & Plan:   Principal Problem:   Seizure [facility]) Active Problems:   Essential hypertension   Hyperlipidemia   Thoracic aortic aneurysm without rupture (HCC)   Alcohol dependence with withdrawal with complication (HCC)   Hypokalemia   Leukocytosis   Frequent falls   Chronic pain   Seizures (HCC)   Elevated troponin I level   Alcohol withdrawal syndrome, with delirium ([place])   Altered mental status    1. Severe alcohol withdrawals. Patient is more calm today, continue neuro checks per unit protocol, continue on physical restrains, hand mittens have been removed. On benzodiazepines IV per protocol, patient received a total of 20 mg of lorazepam IV on 03/02. Will allow patient to eat. Continue as needed morphine for pain.   2. Seizures. Likely related to withdrawal from alcohol, no further seizure activity, will continue neuro checks and benzodiazepines IV. Unable to get MRI due to lack of cooperation.   3. Metabolic encephalopathy. Due to severe withdrawal syndrome, continue IV dextrose and vitamins, including thiamine. This am more calm and able to respond to simple questions and follow simple commands.   4. Hypokalemia. K corrected at 4.0. Will continue hydration with lactated ringers and dextrose, balanced electrolyte solution. Will follow on renal panel in am, renal function with cr at 0,63.  5. Elevated CPK with rhabdomyolisis. Worsening CPK elevation, no in the 1000 range, cw  rhabdomyolisis will continue hydration with IV fluids, and follow cpk in am.    6. HTN. Continue as needed labetalol. Blood pressure still uncontrolled, will increase amlodipine to 10 mg. Will need further adjustments.      DVT prophylaxis: enoxaparin  Code Status: full  Family Communication: I spoke with patient's wife at the bedside and all questions were addressed.  Disposition Plan: home   Consultants:   Neurology   Procedures:   Antimicrobials:    Subjective: Last night patient more calm and cooperative, hand mittens were removed, continue on 4 limb restrains, patient's wife at the bedside. Patient has been very depressed at home over last few days before hospitalization.   Objective: Vitals:   06/04/16 2035 06/04/16 2235 06/05/16 0000 06/05/16 0332  BP: (!) 162/102 130/87    Pulse: 85 85    Resp: (!) 21 20    Temp:   97.9 F (36.6 C) 97.7 F (36.5 C)  TempSrc:   Axillary Axillary  SpO2: 96% 98%    Weight:      Height:        Intake/Output Summary (Last 24 hours) at 06/05/16 0751 Last data filed at 06/05/16 0700  Gross per 24 hour  Intake             2060 ml  Output             1400 ml  Net              660 ml   Filed Weights   06/03/16 1642 06/04/16 0402  Weight: 74.8 kg (165 lb) 71.2 kg (157 lb)    Examination:  General exam: more calm, somnolent. Not agitated E ENT: oral mucosa moist, no pallor or icterus. Respiratory system: Clear to auscultation. Respiratory effort normal. Mild decreased breath sounds due to poor inspiratory effort, no wheezing, rales or rhonchi.  Cardiovascular system: S1 & S2 heard, RRR. No JVD, murmurs, rubs, gallops or clicks. No pedal edema. Gastrointestinal system: Abdomen is nondistended, soft and nontender. No organomegaly or masses felt. Normal bowel sounds heard. Central nervous system: follows commands and responds to simple questions, somnolent. Extremities: Symmetric 5 x 5 power. Skin: No rashes, lesions or  ulcers    Data Reviewed: I have personally reviewed following labs and imaging studies  CBC:  Recent Labs Lab 06/03/16 1658 06/03/16 1711 06/04/16 0418 06/05/16 0653  WBC 17.0*  --  11.3* 7.7  NEUTROABS 15.3*  --   --  5.4  HGB 13.9 14.6 13.6 14.2  HCT 40.4 43.0 39.8 41.7  MCV 90.0  --  91.1 91.9  PLT 245  --  236 [id number]   Basic Metabolic Panel:  Recent Labs Lab 06/03/16 1658 06/03/16 1711 06/04/16 0418  NA 135 136 137  K 3.3* 3.2* 3.3*  CL 103 104 104  CO2 21*  --  22  GLUCOSE 123* 126* 126*  BUN 12 13 9   CREATININE 0.81 0.70 0.82  CALCIUM 9.1  --  8.9  MG  --   --  2.3   GFR: Estimated Creatinine Clearance: 83.2 mL/min (by C-G formula based on SCr of 0.82 mg/dL). Liver Function Tests:  Recent Labs Lab 06/03/16 1658 06/04/16 0418  AST 33 43*  ALT 21 21  ALKPHOS 74 67  BILITOT 1.0 1.9*  PROT 7.1 6.6  ALBUMIN 4.6 4.0   No results for input(s): LIPASE, AMYLASE in the last 168 hours.  Recent Labs Lab 06/04/16 1811  AMMONIA 27   Coagulation Profile:  Recent Labs Lab 06/03/16 1658  INR 0.89   Cardiac Enzymes:  Recent Labs Lab 06/03/16 2323 06/04/16 0418 06/04/16 0812 06/04/16 1135 06/04/16 1511 06/04/16 1811 06/04/16 2142 06/05/16 0044  CKTOTAL  --   --  566*  --  812*  --  1,013* 1,021*  CKMB  --   --  23.9*  --  20.6*  --  18.7* 18.7*  TROPONINI 0.35* 0.26*  --  0.16*  --  0.18*  --   --    BNP (last 3 results) No results for input(s): PROBNP in the last 8760 hours. HbA1C: No results for input(s): HGBA1C in the last 72 hours. CBG:  Recent Labs Lab 06/03/16 1638 06/04/16 0808  GLUCAP 154* 100*   Lipid Profile: No results for input(s): CHOL, HDL, LDLCALC, TRIG, CHOLHDL, LDLDIRECT in the last 72 hours. Thyroid Function Tests:  Recent Labs  06/04/16 1811  TSH 2.087   Anemia Panel: No results for input(s): VITAMINB12, FOLATE, FERRITIN, TIBC, IRON, RETICCTPCT in the last 72 hours. Sepsis Labs: No results for input(s):  PROCALCITON, LATICACIDVEN in the last 168 hours.  Recent Results (from the past 240 hour(s))  MRSA PCR Screening     Status: None   Collection Time: 06/04/16  4:00 AM  Result Value Ref Range Status   MRSA by PCR NEGATIVE NEGATIVE Final    Comment:        The GeneXpert MRSA Assay (FDA approved for NASAL specimens only), is one component of a comprehensive MRSA colonization surveillance program. It is not intended to diagnose MRSA infection nor to guide or monitor treatment for MRSA infections.  Radiology Studies: Ct Head Wo Contrast  Result Date: 06/03/2016 CLINICAL DATA:  Seizure EXAM: CT HEAD WITHOUT CONTRAST TECHNIQUE: Contiguous axial images were obtained from the base of the skull through the vertex without intravenous contrast. COMPARISON:  None FINDINGS: Brain: There is atrophy and chronic small vessel disease changes. No acute intracranial abnormality. Specifically, no hemorrhage, hydrocephalus, mass lesion, acute infarction, or significant intracranial injury. Vascular: No hyperdense vessel or unexpected calcification. Skull: No acute calvarial abnormality. Sinuses/Orbits: Mild mucosal thickening in the maxillary sinuses and scattered ethmoid air cells. No air-fluid levels. Other: None IMPRESSION: No acute intracranial abnormality. Atrophy, chronic microvascular disease. Electronically Signed   By: Rolm Baptise M.D.   On: 06/03/2016 21:08   Dg Chest Port 1 View  Result Date: 06/03/2016 CLINICAL DATA:  Recent seizure activity EXAM: PORTABLE CHEST 1 VIEW COMPARISON:  01/20/2015 FINDINGS: The heart size and mediastinal contours are within normal limits. Both lungs are clear. The visualized skeletal structures are unremarkable. IMPRESSION: No active disease. Electronically Signed   By: Inez Catalina M.D.   On: 06/03/2016 19:00        Scheduled Meds: . amLODipine  2.5 mg Oral Daily  . aspirin  81 mg Oral Daily  . atorvastatin  40 mg Oral Daily  . enoxaparin (LOVENOX)  injection  40 mg Subcutaneous Daily  . folic acid  1 mg Intravenous Daily  . multivitamin with minerals  1 tablet Oral Daily  . pantoprazole (PROTONIX) IV  40 mg Intravenous Q24H  . sodium chloride flush  3 mL Intravenous Q12H  . thiamine  100 mg Intravenous Daily   Continuous Infusions: . dextrose 5 % and 0.9 % NaCl with KCl 20 mEq/L 100 mL/hr at 06/04/16 1424     LOS: 2 days        Mauricio Gerome Apley, MD [facility] Pager [phone]  If 7PM-7AM, please contact night-coverage [email or web address] Password [email or web address] 06/05/2016, 7:51 AM

## 2016-06-06 LAB — CK TOTAL AND CKMB (NOT AT ARMC)
CK TOTAL: 586 U/L — AB (ref 49–397)
CK, MB: 5.9 ng/mL — AB (ref 0.5–5.0)
CK, MB: 5.5 ng/mL — AB (ref 0.5–5.0)
CK, MB: 3.6 ng/mL (ref 0.5–5.0)
RELATIVE INDEX: 0.8 (ref 0.0–2.5)
Relative Index: 0.6 (ref 0.0–2.5)
Relative Index: 0.6 (ref 0.0–2.5)
Total CK: 916 U/L — ABNORMAL HIGH (ref 49–397)
Total CK: 669 U/L — ABNORMAL HIGH (ref 49–397)

## 2016-06-06 LAB — GLUCOSE, CAPILLARY
Glucose-Capillary: 151 mg/dL — ABNORMAL HIGH (ref 65–99)
Glucose-Capillary: 114 mg/dL — ABNORMAL HIGH (ref 65–99)

## 2016-06-06 LAB — BASIC METABOLIC PANEL
ANION GAP: 7 (ref 5–15)
BUN: 9 mg/dL (ref 6–20)
CALCIUM: 9.4 mg/dL (ref 8.9–10.3)
CO2: 26 mmol/L (ref 22–32)
Chloride: 105 mmol/L (ref 101–111)
Creatinine, Ser: 0.63 mg/dL (ref 0.61–1.24)
GFR calc Af Amer: >60 mL/min (ref >60)
GFR calc non Af Amer: >60 mL/min (ref >60)
Glucose, Bld: 113 mg/dL — ABNORMAL HIGH (ref 65–99)
POTASSIUM: 3.6 mmol/L (ref 3.5–5.1)
Sodium: 138 mmol/L (ref 135–145)

## 2016-06-06 MED ORDER — FOLIC ACID 1 MG PO TABS
1.0000 mg | ORAL_TABLET | Freq: Every day | ORAL | Status: DC
  Administered 2016-06-07: 1 mg via ORAL
  Filled 2016-06-06: qty 1

## 2016-06-06 MED ORDER — LORAZEPAM 1 MG PO TABS: 0.0000 mg | ORAL_TABLET | Freq: Four times a day (QID) | ORAL | Status: DC

## 2016-06-06 MED ORDER — POTASSIUM CHLORIDE CRYS ER 20 MEQ PO TBCR
40.0000 meq | EXTENDED_RELEASE_TABLET | Freq: Once | ORAL | Status: AC
  Administered 2016-06-06: 40 meq via ORAL
  Filled 2016-06-06: qty 2

## 2016-06-06 MED ORDER — GADOBENATE DIMEGLUMINE 529 MG/ML IV SOLN
14.0000 mL | Freq: Once | INTRAVENOUS | Status: AC | PRN
  Administered 2016-06-06: 14 mL via INTRAVENOUS

## 2016-06-06 MED ORDER — VITAMIN B-1 100 MG PO TABS
100.0000 mg | ORAL_TABLET | Freq: Every day | ORAL | Status: DC
  Administered 2016-06-07: 100 mg via ORAL
  Filled 2016-06-06: qty 1

## 2016-06-06 MED ORDER — LORAZEPAM 1 MG PO TABS: 1.0000 mg | ORAL_TABLET | Freq: Four times a day (QID) | ORAL | Status: DC | PRN

## 2016-06-06 MED ORDER — LORAZEPAM 2 MG/ML IJ SOLN: 1.0000 mg | Freq: Four times a day (QID) | INTRAMUSCULAR | Status: DC | PRN

## 2016-06-06 MED ORDER — LORAZEPAM 1 MG PO TABS: 0.0000 mg | ORAL_TABLET | Freq: Two times a day (BID) | ORAL | Status: DC

## 2016-06-06 MED ORDER — PANTOPRAZOLE SODIUM 40 MG PO TBEC
40.0000 mg | DELAYED_RELEASE_TABLET | Freq: Every day | ORAL | Status: DC
  Administered 2016-06-07: 40 mg via ORAL
  Filled 2016-06-06: qty 1

## 2016-06-06 NOTE — Progress Notes (Signed)
PROGRESS NOTE    Justin Sutton  [id number] DOB: Aug 29, 1945 DOA: 06/03/2016 PCP: Lujean Amel, MD    Brief Narrative:  71 yo male with etho abuse, presents with seizure activity, generalized seizures x7, associated with confusion and agitation. On the initial examination patient awake but not following commands. Admitted to the step down unit, for severe withdrawal symptoms. Responding to benzodiazepines IV per protocol, still on restrains. Very depressed at home before admission. Responding well to benzodiazepines, follow on MRI.    Assessment & Plan:   Principal Problem:   Seizure [facility]) Active Problems:   Essential hypertension   Hyperlipidemia   Thoracic aortic aneurysm without rupture (HCC)   Alcohol dependence with withdrawal with complication (HCC)   Hypokalemia   Leukocytosis   Frequent falls   Chronic pain   Seizures (HCC)   Elevated troponin I level   Alcohol withdrawal syndrome, with delirium ([place])   Altered mental status   1. Severe alcohol withdrawals. Patient is more calm and awake, able to converse. Over last 24 hours, patient had only one dose of lorazepam. Patient is off restrains and reports increase appetite. Reports alcohol intake of 4 beers per day. Will remove telemetry monitor, will change to oral benzodiazepine protocol and will request re-evaluation of speech therapy in order to advance his diet. Out of bed as tolerated and physical therapy evaluation.   2. Seizures. No further seizure, still suspected withdrawal related, will follow on MRI and neurology recommendations.   3. Metabolic encephalopathy. Clinically improved, reduced requirements of benzodiazepines per protocol, will continue  vitamins, including thiamine.   4. Hypokalemia. Continue correction of k with kcl, will follow on renal panel in am, renal function with cr at 0.63 and serum bicarbonate 26.  Will hold on IV fluids for now.   5. Elevated CPK with rhabdomyolisis. CPK trending  down with peak at  1202, patient tolerating po well, will continue to follow renal function, hold on IV fluids for now.   6. HTN. Blood pressure systolic [id number] to [id number], will continue at increased dose of amlodipine.    DVT prophylaxis:enoxaparin  Code Status:full  Family Communication:I spoke with patient's wife at the bedside and all questions were addressed.  Disposition Plan:home   Consultants:  Neurology     Subjective: Patient off restrains, tolerating po well, no nausea or vomiting, feeling anxious to be discharged. No chest pain, no dyspnea, no headache. Assures he takes 4 beers per day.   Objective: Vitals:   06/06/16 0100 06/06/16 0200 06/06/16 0300 06/06/16 0408  BP: (!) 136/94   (!) 151/95  Pulse: 85 84 81 86  Resp: 13 18 19  (!) 21  Temp:    97.9 F (36.6 C)  TempSrc:    Oral  SpO2: 97% 100% 98% 99%  Weight:      Height:        Intake/Output Summary (Last 24 hours) at 06/06/16 0752 Last data filed at 06/06/16 0411  Gross per 24 hour  Intake           313.75 ml  Output             1925 ml  Net         -1611.25 ml   Filed Weights   06/03/16 1642 06/04/16 0402  Weight: 74.8 kg (165 lb) 71.2 kg (157 lb)    Examination:  General exam: not in pain or dyspnea E ENT: no pallor or icterus, oral mucosa moist Respiratory system: Clear to auscultation. Respiratory effort  normal. No wheezing, rales or rhonchi.  Cardiovascular system: S1 & S2 heard, RRR. No JVD, murmurs, rubs, gallops or clicks. No pedal edema. Gastrointestinal system: Abdomen is nondistended, soft and nontender. No organomegaly or masses felt. Normal bowel sounds heard. Central nervous system: Alert and oriented. No focal neurological deficits. Extremities: Symmetric 5 x 5 power. Skin: No rashes, lesions or ulcers     Data Reviewed: I have personally reviewed following labs and imaging studies  CBC:  Recent Labs Lab 06/03/16 1658 06/03/16 1711 06/04/16 0418 06/05/16 0653    WBC 17.0*  --  11.3* 7.7  NEUTROABS 15.3*  --   --  5.4  HGB 13.9 14.6 13.6 14.2  HCT 40.4 43.0 39.8 41.7  MCV 90.0  --  91.1 91.9  PLT 245  --  236 123456   Basic Metabolic Panel:  Recent Labs Lab 06/03/16 1658 06/03/16 1711 06/04/16 0418 06/05/16 0653  NA 135 136 137 141  K 3.3* 3.2* 3.3* 4.0  CL 103 104 104 109  CO2 21*  --  22 21*  GLUCOSE 123* 126* 126* 113*  BUN 12 13 9 8   CREATININE 0.81 0.70 0.82 0.63  CALCIUM 9.1  --  8.9 9.1  MG  --   --  2.3  --    GFR: Estimated Creatinine Clearance: 85.3 mL/min (by C-G formula based on SCr of 0.63 mg/dL). Liver Function Tests:  Recent Labs Lab 06/03/16 1658 06/04/16 0418  AST 33 43*  ALT 21 21  ALKPHOS 74 67  BILITOT 1.0 1.9*  PROT 7.1 6.6  ALBUMIN 4.6 4.0   No results for input(s): LIPASE, AMYLASE in the last 168 hours.  Recent Labs Lab 06/04/16 1811  AMMONIA 27   Coagulation Profile:  Recent Labs Lab 06/03/16 1658  INR 0.89   Cardiac Enzymes:  Recent Labs Lab 06/03/16 2323 06/04/16 0418  06/04/16 1135  06/04/16 1811  06/05/16 0044 06/05/16 0653 06/05/16 1249 06/05/16 1757 06/06/16 0105  CKTOTAL  --   --   < >  --   < >  --   < > 1,021* 1,072* 1,139* 1,202* 916*  CKMB  --   --   < >  --   < >  --   < > 18.7* 16.0* 13.2* 8.9* 5.9*  TROPONINI 0.35* 0.26*  --  0.16*  --  0.18*  --   --   --   --   --   --   < > = values in this interval not displayed. BNP (last 3 results) No results for input(s): PROBNP in the last 8760 hours. HbA1C: No results for input(s): HGBA1C in the last 72 hours. CBG:  Recent Labs Lab 06/03/16 1638 06/04/16 0808 06/05/16 0812  GLUCAP 154* 100* 129*   Lipid Profile: No results for input(s): CHOL, HDL, LDLCALC, TRIG, CHOLHDL, LDLDIRECT in the last 72 hours. Thyroid Function Tests:  Recent Labs  06/04/16 1811  TSH 2.087   Anemia Panel: No results for input(s): VITAMINB12, FOLATE, FERRITIN, TIBC, IRON, RETICCTPCT in the last 72 hours. Sepsis Labs: No results  for input(s): PROCALCITON, LATICACIDVEN in the last 168 hours.  Recent Results (from the past 240 hour(s))  MRSA PCR Screening     Status: None   Collection Time: 06/04/16  4:00 AM  Result Value Ref Range Status   MRSA by PCR NEGATIVE NEGATIVE Final    Comment:        The GeneXpert MRSA Assay (FDA approved for NASAL specimens only), is  one component of a comprehensive MRSA colonization surveillance program. It is not intended to diagnose MRSA infection nor to guide or monitor treatment for MRSA infections.          Radiology Studies: Mr Jeri Cos [id number] Contrast  Result Date: 06/06/2016 CLINICAL DATA:  Seizures. EXAM: MRI HEAD WITHOUT AND WITH CONTRAST TECHNIQUE: Multiplanar, multiecho pulse sequences of the brain and surrounding structures were obtained without and with intravenous contrast. CONTRAST:  3mL MULTIHANCE GADOBENATE DIMEGLUMINE 529 MG/ML IV SOLN COMPARISON:  Head CT 06/03/2016 FINDINGS: Brain: No focal diffusion restriction to indicate acute infarct. No intraparenchymal hemorrhage. There is diffuse confluent hyperintense T2-weighted signal within the periventricular and deep white matter, most often seen in the setting of chronic microvascular ischemia. No mass lesion or midline shift. No hydrocephalus or extra-axial fluid collection. The midline structures are normal. Diffuse atrophy without lobar predominance. Vascular: Major intracranial arterial and venous sinus flow voids are preserved. No evidence of chronic microhemorrhage or amyloid angiopathy. Skull and upper cervical spine: The visualized skull base, calvarium, upper cervical spine and extracranial soft tissues are normal. Sinuses/Orbits: No fluid levels or advanced mucosal thickening. No mastoid effusion. Normal orbits. IMPRESSION: Advanced chronic microvascular ischemia without acute intracranial abnormality. Electronically Signed   By: Ulyses Jarred M.D.   On: 06/06/2016 01:56        Scheduled Meds: . amLODipine   10 mg Oral Daily  . aspirin  81 mg Oral Daily  . atorvastatin  40 mg Oral Daily  . enoxaparin (LOVENOX) injection  40 mg Subcutaneous Daily  . folic acid  1 mg Intravenous Daily  . multivitamin with minerals  1 tablet Oral Daily  . pantoprazole (PROTONIX) IV  40 mg Intravenous Q24H  . sodium chloride flush  3 mL Intravenous Q12H  . thiamine  100 mg Intravenous Daily   Continuous Infusions: . dextrose 5% lactated ringers 75 mL/hr at 06/06/16 0640     LOS: 3 days     Dillyn Menna Gerome Apley, MD [facility] Pager [phone]  If 7PM-7AM, please contact night-coverage [email or web address] Password [email or web address] 06/06/2016, 7:52 AM

## 2016-06-06 NOTE — Progress Notes (Signed)
PHARMACIST - PHYSICIAN COMMUNICATION  DR:  Christia Reading Opyd  CONCERNING: IV to Oral Route Change Policy  RECOMMENDATION: This patient is receiving pantoprazole, thiamine, and folic acid by the intravenous route.  Based on criteria approved by the Pharmacy and Therapeutics Committee, the intravenous medication(s) is/are being converted to the equivalent oral dose form(s).   DESCRIPTION: These criteria include:  The patient is eating (either orally or via tube) and/or has been taking other orally administered medications for a least 24 hours  The patient has no evidence of active gastrointestinal bleeding or impaired GI absorption (gastrectomy, short bowel, patient on TNA or NPO).  If you have questions about this conversion, please contact the Pharmacy Department  [x]   [phone] )  Zacarias Pontes  Stark Klein, PharmD Clinical Pharmacy Resident [phone] (Pager) 06/06/2016 1:44 PM

## 2016-06-06 NOTE — Progress Notes (Signed)
Subjective: Interval History: No further seizures.  Back to baseline. Admits to drinking alcohol daily for last year but stopped it 2 days before seizure onset.  He also admits to [facility] use regularly, but stopped it recently before admission too.    EEG is normal.  I reviewed his MRI Brain which shows moderate periventricular and subcortical ischemic disease.  No discrete lesion to cause seizures.    Objective: Vital signs in last 24 hours: Temp:  [97.5 F (36.4 C)-98.7 F (37.1 C)] 97.5 F (36.4 C) (03/04 0800) Pulse Rate:  [81-107] 93 (03/04 0800) Resp:  [13-29] 29 (03/04 0800) BP: (122-161)/(85-112) 151/95 (03/04 0408) SpO2:  [96 %-100 %] 100 % (03/04 0800)  Intake/Output from previous day: 03/03 0701 - 03/04 0700 In: 313.8 [P.O.:120; I.V.:193.8] Out: 1925 [id number] Intake/Output this shift: No intake/output data recorded. Nutritional status: DIET DYS 2 Room service appropriate? Yes; Fluid consistency: Thin  Neurologic Exam:  Normal completely.  Lab Results:  Recent Labs  06/04/16 0418 06/05/16 0653 06/06/16 0607  WBC 11.3* 7.7  --   HGB 13.6 14.2  --   HCT 39.8 41.7  --   PLT 236 215  --   NA 137 141 138  K 3.3* 4.0 3.6  CL 104 109 105  CO2 22 21* 26  GLUCOSE 126* 113* 113*  BUN 9 8 9   CREATININE 0.82 0.63 0.63  CALCIUM 8.9 9.1 9.4   Lipid Panel No results for input(s): CHOL, TRIG, HDL, CHOLHDL, VLDL, LDLCALC in the last 72 hours.  Studies/Results: Mr Jeri Cos [id number] Contrast  Result Date: 06/06/2016 CLINICAL DATA:  Seizures. EXAM: MRI HEAD WITHOUT AND WITH CONTRAST TECHNIQUE: Multiplanar, multiecho pulse sequences of the brain and surrounding structures were obtained without and with intravenous contrast. CONTRAST:  26mL MULTIHANCE GADOBENATE DIMEGLUMINE 529 MG/ML IV SOLN COMPARISON:  Head CT 06/03/2016 FINDINGS: Brain: No focal diffusion restriction to indicate acute infarct. No intraparenchymal hemorrhage. There is diffuse confluent hyperintense T2-weighted  signal within the periventricular and deep white matter, most often seen in the setting of chronic microvascular ischemia. No mass lesion or midline shift. No hydrocephalus or extra-axial fluid collection. The midline structures are normal. Diffuse atrophy without lobar predominance. Vascular: Major intracranial arterial and venous sinus flow voids are preserved. No evidence of chronic microhemorrhage or amyloid angiopathy. Skull and upper cervical spine: The visualized skull base, calvarium, upper cervical spine and extracranial soft tissues are normal. Sinuses/Orbits: No fluid levels or advanced mucosal thickening. No mastoid effusion. Normal orbits. IMPRESSION: Advanced chronic microvascular ischemia without acute intracranial abnormality. Electronically Signed   By: Ulyses Jarred M.D.   On: 06/06/2016 01:56    Medications:  Scheduled: . amLODipine  10 mg Oral Daily  . aspirin  81 mg Oral Daily  . atorvastatin  40 mg Oral Daily  . enoxaparin (LOVENOX) injection  40 mg Subcutaneous Daily  . folic acid  1 mg Intravenous Daily  . LORazepam  0-4 mg Oral Q6H   Followed by  . [START ON 06/08/2016] LORazepam  0-4 mg Oral Q12H  . multivitamin with minerals  1 tablet Oral Daily  . pantoprazole (PROTONIX) IV  40 mg Intravenous Q24H  . sodium chloride flush  3 mL Intravenous Q12H  . thiamine  100 mg Intravenous Daily    Assessment/Plan:  GTCS, likely related to alcohol and THC withdrawal.  No lesion in the brain to do it.  Brain abnormalites are related to history of smoking and hypertension.    I do not recommend  long term AED treatment since seizures appear provoked.  He is advised to stay away from alcohol and THC.    My discharge home from my perspective.     LOS: 3 days   Rogue Jury, MD 06/06/2016  10:09 AM

## 2016-06-06 NOTE — Progress Notes (Signed)
  Speech Language Pathology T   Patient Details Name: Justin Sutton MRN: [id number] DOB: 1945-12-29 Today's Date: 06/06/2016 Time:  -      ST re-ordered to see if diet was okay to upgrade. Reviewed pt's chart and initial ST documented pt can upgrade to regular once he has dentures and pt/family desires upgrade. RN stated family is bringing dentures today. Okay for RN to upgrade to regular texture. No need for repeat assessment at this time. Thanks.   Orbie Pyo [place].Ed [employer] [phone]

## 2016-06-07 LAB — BASIC METABOLIC PANEL
Anion gap: 6 (ref 5–15)
BUN: 10 mg/dL (ref 6–20)
CALCIUM: 9.4 mg/dL (ref 8.9–10.3)
CO2: 26 mmol/L (ref 22–32)
Chloride: 104 mmol/L (ref 101–111)
Creatinine, Ser: 0.76 mg/dL (ref 0.61–1.24)
GFR calc Af Amer: >60 mL/min (ref >60)
GFR calc non Af Amer: >60 mL/min (ref >60)
GLUCOSE: 110 mg/dL — AB (ref 65–99)
Potassium: 4 mmol/L (ref 3.5–5.1)
Sodium: 136 mmol/L (ref 135–145)

## 2016-06-07 LAB — GLUCOSE, CAPILLARY: Glucose-Capillary: 122 mg/dL — ABNORMAL HIGH (ref 65–99)

## 2016-06-07 MED ORDER — AMLODIPINE BESYLATE 5 MG PO TABS: 5.0000 mg | ORAL_TABLET | Freq: Every day | ORAL | 0 refills | Status: DC

## 2016-06-07 NOTE — Progress Notes (Signed)
PT Cancellation and Discharge Note  Patient Details Name: Justin Sutton MRN: [id number] DOB: [birth year]   Cancelled Treatment:    Reason Eval/Treat Not Completed: PT screened, no needs identified, will sign off.  Pt up independently on unit and even demonstrated picking up object from floor without deficit.  No PT needs at this time.  Will sign off.     Thornton Papas Dimitri Dsouza 06/07/2016, 8:58 AM

## 2016-06-07 NOTE — Consult Note (Signed)
           [facility] CM Primary Care Navigator  06/07/2016  Justin Sutton [birth year] [id number]   Went to see patient at the bedside to identify possible discharge needs but he was already discharged home per staff.  Primary care provider's office called [name]) to notify of patient's discharge and need for post hospital follow-up and transition of care.  Made aware to refer patient to [facility] care management if deemed appropriate for services.  For questions, please contact:  Dannielle Huh, BSN, RN- [facility] Primary Care Navigator  Telephone: [phone] [place]

## 2016-06-07 NOTE — Discharge Summary (Addendum)
Physician Discharge Summary  Justin Sutton [id number] DOB: 05/02/1945 DOA: 06/03/2016  PCP: Lujean Amel, MD  Admit date: 06/03/2016 Discharge date: 06/07/2016  Admitted From: Home  Disposition:  Home   Recommendations for Outpatient Follow-up:  1. Follow up with PCP in 1- week 2.    Patient will benefit from outpatient psychiatric services for depression symptoms. Home Health: No  Equipment/Devices: No   Discharge Condition: Stable  CODE STATUS: Full   Diet recommendation: Heart Healthy   Brief/Interim Summary: This is a 71 year old male who presented to the hospital with the chief complaint of seizures. Patient developed generalized seizures, 4. All episodes were witnessed. Positive history of alcohol abuse. When patient arrived to the emergency room he was agitated requiring Ativan. His initial blood pressure 137/87, heart rate 104, respiratory rate 21, oxygen saturation 99%. He was sedated, responsive to pain stimuli, his mucous membranes were moist, his lungs were clear to auscultation bilaterally, heart S1-S2 present and rhythmic, his abdomen was soft, lower extremities no edema. Sodium 135, potassium 3.3, chloride 103, bicarbonate 21, glucose 123, BUN 12, creatinine 0.81, white count 17.0, hemoglobin 13.9, hematocrit 40.4, platelets 245. Alcohol level less than 5. Chest x-ray was negative for infiltrates. EKG was sinus tachycardia. Urine analysis negative for infection. Urine drug screen positive for benzodiazepines and THC. Head CT negative for acute intracranial changes.  The patient was admitted with the working diagnosis of severe alcohol withdrawal syndrome, complicated by generalized seizures and toxic/metabolic encephalopathy.  1. Severe alcohol withdrawal syndrome. Patient was admitted to the stepdown unit, placed on a telemetry monitor, he received benzodiazepines intravenously per protocol, to use up to 20 mg of benzodiazepines at one point. Over last 48 hours he has not  used any benzodiazepines. By the time of discharge he was awake and alert, no tremors, diaphoresis or withdrawal symptoms.  2. Metabolic/toxic encephalopathy. Patient was placed on IV fluids with dextrose, thiamine, folic acid and multivitamins. He responded well to supportive medical therapy, the time of discharge he was awake and alert.  3. Generalized withdrawal seizures. Patient was treated with benzodiazepines as needed for withdrawal symptoms, no further seizure activity, patient underwent electroencephalography which showed no seizure activity. Patient was seen by neurology, the seizures were presumed to be related to withdrawal. No antiseizure medications are indicated.   4. Rhabdomyolysis. CPK peak at 1202, currently trending down 586. Any function remained stable that discharge creatinine 0.76.  5. Hypokalemia. Potassium was corrected with potassium chloride, discharge potassium 4.0.  6. Uncontrolled hypertension. Blood pressure remained elevated, amlodipine dose was increased to 10 mg during his hospitalization. By the time of discharge his blood pressure has improved down to [id number] systolic, patient will be discharged on 5 mg amlodipine. Close follow-up as an outpatient.   7. Depression. The patient and his family expresses willing to follow-up with psychiatry for depression symptoms, currently not suicidal or homicidal.  8. Alcohol abuse. Patient received counseling about alcohol abuse, currently he is committed to stop consuming alcohol beverages.   Discharge Diagnoses:  Principal Problem:   Seizure [facility]) Active Problems:   Essential hypertension   Hyperlipidemia   Thoracic aortic aneurysm without rupture ([place])   Alcohol dependence with withdrawal with complication (HCC)   Hypokalemia   Leukocytosis   Frequent falls   Chronic pain   Seizures (HCC)   Elevated troponin I level   Alcohol withdrawal syndrome, with delirium ([place])   Altered mental status    Discharge  Instructions   Allergies as of 06/07/2016  No Known Allergies     Medication List    TAKE these medications   amLODipine 5 MG tablet Commonly known as:  NORVASC Take 1 tablet (5 mg total) by mouth daily. What changed:  medication strength  how much to take   aspirin 81 MG chewable tablet Chew 81 mg by mouth daily.   atorvastatin 40 MG tablet Commonly known as:  LIPITOR Take 40 mg by mouth daily.   omeprazole 40 MG capsule Commonly known as:  PRILOSEC Take 40 mg by mouth daily.      Follow-up [place], [place] Follow up in 1 week(s).   Specialty:  Family Medicine Contact information: [address] [place] [address] [phone]          No Known Allergies  Consultations:  Neurology    Procedures/Studies: Ct Head Wo Contrast  Result Date: 06/03/2016 CLINICAL DATA:  Seizure EXAM: CT HEAD WITHOUT CONTRAST TECHNIQUE: Contiguous axial images were obtained from the base of the skull through the vertex without intravenous contrast. COMPARISON:  None FINDINGS: Brain: There is atrophy and chronic small vessel disease changes. No acute intracranial abnormality. Specifically, no hemorrhage, hydrocephalus, mass lesion, acute infarction, or significant intracranial injury. Vascular: No hyperdense vessel or unexpected calcification. Skull: No acute calvarial abnormality. Sinuses/Orbits: Mild mucosal thickening in the maxillary sinuses and scattered ethmoid air cells. No air-fluid levels. Other: None IMPRESSION: No acute intracranial abnormality. Atrophy, chronic microvascular disease. Electronically Signed   By: Rolm Baptise M.D.   On: 06/03/2016 21:08   Mr Jeri Cos [id number] Contrast  Result Date: 06/06/2016 CLINICAL DATA:  Seizures. EXAM: MRI HEAD WITHOUT AND WITH CONTRAST TECHNIQUE: Multiplanar, multiecho pulse sequences of the brain and surrounding structures were obtained without and with intravenous contrast. CONTRAST:  57mL MULTIHANCE  GADOBENATE DIMEGLUMINE 529 MG/ML IV SOLN COMPARISON:  Head CT 06/03/2016 FINDINGS: Brain: No focal diffusion restriction to indicate acute infarct. No intraparenchymal hemorrhage. There is diffuse confluent hyperintense T2-weighted signal within the periventricular and deep white matter, most often seen in the setting of chronic microvascular ischemia. No mass lesion or midline shift. No hydrocephalus or extra-axial fluid collection. The midline structures are normal. Diffuse atrophy without lobar predominance. Vascular: Major intracranial arterial and venous sinus flow voids are preserved. No evidence of chronic microhemorrhage or amyloid angiopathy. Skull and upper cervical spine: The visualized skull base, calvarium, upper cervical spine and extracranial soft tissues are normal. Sinuses/Orbits: No fluid levels or advanced mucosal thickening. No mastoid effusion. Normal orbits. IMPRESSION: Advanced chronic microvascular ischemia without acute intracranial abnormality. Electronically Signed   By: Ulyses Jarred M.D.   On: 06/06/2016 01:56   Dg Chest Port 1 View  Result Date: 06/03/2016 CLINICAL DATA:  Recent seizure activity EXAM: PORTABLE CHEST 1 VIEW COMPARISON:  01/20/2015 FINDINGS: The heart size and mediastinal contours are within normal limits. Both lungs are clear. The visualized skeletal structures are unremarkable. IMPRESSION: No active disease. Electronically Signed   By: Inez Catalina M.D.   On: 06/03/2016 19:00    (Echo, Carotid, EGD, Colonoscopy, ERCP)    Subjective: Patient feeling better, no nausea or vomiting, no confusion or agitation.    Discharge Exam: Vitals:   06/06/16 2200 06/07/16 0506  BP: (!) 135/97 (!) 150/90  Pulse: 90 87  Resp: 18 18  Temp: 98.1 F (36.7 C) 98.2 F (36.8 C)   Vitals:   06/06/16 1200 06/06/16 1639 06/06/16 2200 06/07/16 0506  BP:  133/89 (!) 135/97 (!) 150/90  Pulse:  88 90 87  Resp:  18 18 18   Temp: 97.5 F (36.4 C) 98.1 F (36.7 C) 98.1 F  (36.7 C) 98.2 F (36.8 C)  TempSrc: Oral Oral Oral Oral  SpO2:  100% 98% 100%  Weight:  72.7 kg (160 lb 3.2 oz)    Height:  5\' 11"  (1.803 m)      General: Pt is alert, awake, not in acute distress Cardiovascular: RRR, S1/S2 +, no rubs, no gallops Respiratory: CTA bilaterally, no wheezing, no rhonchi Abdominal: Soft, NT, ND, bowel sounds + Extremities: no edema, no cyanosis    The results of significant diagnostics from this hospitalization (including imaging, microbiology, ancillary and laboratory) are listed below for reference.     Microbiology: Recent Results (from the past 240 hour(s))  MRSA PCR Screening     Status: None   Collection Time: 06/04/16  4:00 AM  Result Value Ref Range Status   MRSA by PCR NEGATIVE NEGATIVE Final    Comment:        The GeneXpert MRSA Assay (FDA approved for NASAL specimens only), is one component of a comprehensive MRSA colonization surveillance program. It is not intended to diagnose MRSA infection nor to guide or monitor treatment for MRSA infections.      Labs: BNP (last 3 results) No results for input(s): BNP in the last 8760 hours. Basic Metabolic Panel:  Recent Labs Lab 06/03/16 1658 06/03/16 1711 06/04/16 0418 06/05/16 0653 06/06/16 0607 06/07/16 0505  NA 135 136 137 141 138 136  K 3.3* 3.2* 3.3* 4.0 3.6 4.0  CL 103 104 104 109 105 104  CO2 21*  --  22 21* 26 26  GLUCOSE 123* 126* 126* 113* 113* 110*  BUN 12 13 9 8 9 10   CREATININE 0.81 0.70 0.82 0.63 0.63 0.76  CALCIUM 9.1  --  8.9 9.1 9.4 9.4  MG  --   --  2.3  --   --   --    Liver Function Tests:  Recent Labs Lab 06/03/16 1658 06/04/16 0418  AST 33 43*  ALT 21 21  ALKPHOS 74 67  BILITOT 1.0 1.9*  PROT 7.1 6.6  ALBUMIN 4.6 4.0   No results for input(s): LIPASE, AMYLASE in the last 168 hours.  Recent Labs Lab 06/04/16 1811  AMMONIA 27   CBC:  Recent Labs Lab 06/03/16 1658 06/03/16 1711 06/04/16 0418 06/05/16 0653  WBC 17.0*  --  11.3*  7.7  NEUTROABS 15.3*  --   --  5.4  HGB 13.9 14.6 13.6 14.2  HCT 40.4 43.0 39.8 41.7  MCV 90.0  --  91.1 91.9  PLT 245  --  236 215   Cardiac Enzymes:  Recent Labs Lab 06/03/16 2323 06/04/16 0418  06/04/16 1135  06/04/16 1811  06/05/16 1249 06/05/16 1757 06/06/16 0105 06/06/16 0607 06/06/16 1259  CKTOTAL  --   --   < >  --   < >  --   < > 1,139* 1,202* 916* 669* 586*  CKMB  --   --   < >  --   < >  --   < > 13.2* 8.9* 5.9* 5.5* 3.6  TROPONINI 0.35* 0.26*  --  0.16*  --  0.18*  --   --   --   --   --   --   < > = values in this interval not displayed. BNP: Invalid input(s): POCBNP CBG:  Recent Labs Lab 06/03/16 1638 06/04/16 0808 06/05/16 [id number] 06/06/16  0802 06/06/16 1625  GLUCAP 154* 100* 129* 151* 114*   D-Dimer No results for input(s): DDIMER in the last 72 hours. Hgb A1c No results for input(s): HGBA1C in the last 72 hours. Lipid Profile No results for input(s): CHOL, HDL, LDLCALC, TRIG, CHOLHDL, LDLDIRECT in the last 72 hours. Thyroid function studies  Recent Labs  06/04/16 1811  TSH 2.087   Anemia work up No results for input(s): VITAMINB12, FOLATE, FERRITIN, TIBC, IRON, RETICCTPCT in the last 72 hours. Urinalysis    Component Value Date/Time   COLORURINE YELLOW 06/03/2016 2043   APPEARANCEUR CLEAR 06/03/2016 2043   LABSPEC 1.013 06/03/2016 2043   PHURINE 6.0 06/03/2016 2043   GLUCOSEU NEGATIVE 06/03/2016 2043   HGBUR NEGATIVE 06/03/2016 2043   [place] NEGATIVE 06/03/2016 2043   [place] NEGATIVE 06/03/2016 2043   PROTEINUR 30 (A) 06/03/2016 2043   UROBILINOGEN 0.2 05/08/2009 1027   NITRITE NEGATIVE 06/03/2016 2043   LEUKOCYTESUR NEGATIVE 06/03/2016 2043   Sepsis Labs Invalid input(s): PROCALCITONIN,  WBC,  LACTICIDVEN Microbiology Recent Results (from the past 240 hour(s))  MRSA PCR Screening     Status: None   Collection Time: 06/04/16  4:00 AM  Result Value Ref Range Status   MRSA by PCR NEGATIVE NEGATIVE Final    Comment:         The GeneXpert MRSA Assay (FDA approved for NASAL specimens only), is one component of a comprehensive MRSA colonization surveillance program. It is not intended to diagnose MRSA infection nor to guide or monitor treatment for MRSA infections.      Time coordinating discharge: 45 minutes  SIGNED:   Tawni Millers, MD  [facility] 06/07/2016, 8:30 AM Pager   If 7PM-7AM, please contact night-coverage [email or web address] Password [email or web address]

## 2016-06-07 NOTE — Progress Notes (Signed)
NURSING PROGRESS NOTE  Justin Sutton [id number] Discharge Data: 06/07/2016 9:53 AM Attending Provider: Mauricio Daniel Arrien, * [id number], MD   [name] to be D/C'd Home per MD order.    All IV's will be discontinued and monitored for bleeding.  All belongings will be returned to patient for patient to take home.  Last Documented Vital Signs:  Blood pressure (!) 150/90, pulse 87, temperature 98.2 F (36.8 C), temperature source Oral, resp. rate 18, height 5\' 11"  (1.803 m), weight 72.7 kg (160 lb 3.2 oz), SpO2 100 %.  Joslyn Hy, MSN, RN, [employer]

## 2016-12-09 DIAGNOSIS — G43A0 Cyclical vomiting, not intractable: Secondary | ICD-10-CM | POA: Diagnosis not present

## 2017-02-16 ENCOUNTER — Encounter (HOSPITAL_COMMUNITY): Payer: Self-pay

## 2017-02-16 ENCOUNTER — Emergency Department (HOSPITAL_COMMUNITY): Payer: Medicare Other

## 2017-02-16 ENCOUNTER — Emergency Department (HOSPITAL_COMMUNITY)
Admission: EM | Admit: 2017-02-16 | Discharge: 2017-02-16 | Disposition: A | Discharge status: Home / Self Care | Payer: Medicare Other | Attending: Emergency Medicine | Admitting: Emergency Medicine

## 2017-02-16 DIAGNOSIS — Z87891 Personal history of nicotine dependence: Secondary | ICD-10-CM | POA: Insufficient documentation

## 2017-02-16 DIAGNOSIS — Z79899 Other long term (current) drug therapy: Secondary | ICD-10-CM | POA: Diagnosis not present

## 2017-02-16 DIAGNOSIS — I10 Essential (primary) hypertension: Secondary | ICD-10-CM | POA: Insufficient documentation

## 2017-02-16 DIAGNOSIS — E785 Hyperlipidemia, unspecified: Secondary | ICD-10-CM | POA: Insufficient documentation

## 2017-02-16 DIAGNOSIS — Z7982 Long term (current) use of aspirin: Secondary | ICD-10-CM | POA: Diagnosis not present

## 2017-02-16 DIAGNOSIS — R569 Unspecified convulsions: Secondary | ICD-10-CM | POA: Diagnosis not present

## 2017-02-16 DIAGNOSIS — G40909 Epilepsy, unspecified, not intractable, without status epilepticus: Secondary | ICD-10-CM

## 2017-02-16 DIAGNOSIS — R4182 Altered mental status, unspecified: Secondary | ICD-10-CM | POA: Diagnosis not present

## 2017-02-16 DIAGNOSIS — S199XXA Unspecified injury of neck, initial encounter: Secondary | ICD-10-CM | POA: Diagnosis not present

## 2017-02-16 LAB — COMPREHENSIVE METABOLIC PANEL
ALT: 25 U/L (ref 17–63)
ANION GAP: 8 (ref 5–15)
AST: 31 U/L (ref 15–41)
Albumin: 4.3 g/dL (ref 3.5–5.0)
Alkaline Phosphatase: 66 U/L (ref 38–126)
BUN: 5 mg/dL — AB (ref 6–20)
CHLORIDE: 103 mmol/L (ref 101–111)
CO2: 24 mmol/L (ref 22–32)
Calcium: 9.2 mg/dL (ref 8.9–10.3)
Creatinine, Ser: 0.68 mg/dL (ref 0.61–1.24)
GFR calc Af Amer: >60 mL/min (ref >60)
Glucose, Bld: 140 mg/dL — ABNORMAL HIGH (ref 65–99)
POTASSIUM: 3.8 mmol/L (ref 3.5–5.1)
Sodium: 135 mmol/L (ref 135–145)
TOTAL PROTEIN: 7.3 g/dL (ref 6.5–8.1)
Total Bilirubin: 0.6 mg/dL (ref 0.3–1.2)

## 2017-02-16 LAB — CBC WITH DIFFERENTIAL/PLATELET
Basophils Absolute: 0 10*3/uL (ref 0.0–0.1)
Basophils Relative: 0 %
EOS PCT: 1 %
Eosinophils Absolute: 0.1 10*3/uL (ref 0.0–0.7)
HEMATOCRIT: 41.2 % (ref 39.0–52.0)
HEMOGLOBIN: 14 g/dL (ref 13.0–17.0)
LYMPHS ABS: 1.2 10*3/uL (ref 0.7–4.0)
LYMPHS PCT: 12 %
MCH: 30.4 pg (ref 26.0–34.0)
MCHC: 34 g/dL (ref 30.0–36.0)
MCV: 89.6 fL (ref 78.0–100.0)
Monocytes Absolute: 0.8 10*3/uL (ref 0.1–1.0)
Monocytes Relative: 8 %
NEUTROS ABS: 7.8 10*3/uL — AB (ref 1.7–7.7)
Neutrophils Relative %: 79 %
Platelets: 335 10*3/uL (ref 150–400)
RBC: 4.6 MIL/uL (ref 4.22–5.81)
RDW: 13.3 % (ref 11.5–15.5)
WBC: 9.9 10*3/uL (ref 4.0–10.5)

## 2017-02-16 LAB — RAPID URINE DRUG SCREEN, HOSP PERFORMED
AMPHETAMINES: NOT DETECTED
BARBITURATES: NOT DETECTED
BENZODIAZEPINES: POSITIVE — AB
Cocaine: NOT DETECTED
Opiates: NOT DETECTED
TETRAHYDROCANNABINOL: POSITIVE — AB

## 2017-02-16 MED ORDER — LEVETIRACETAM 500 MG PO TABS: 500.0000 mg | ORAL_TABLET | Freq: Two times a day (BID) | ORAL | 0 refills | Status: DC

## 2017-02-16 MED ORDER — SODIUM CHLORIDE 0.9 % IV SOLN
1000.0000 mg | Freq: Once | INTRAVENOUS | Status: AC
  Administered 2017-02-16: 1000 mg via INTRAVENOUS
  Filled 2017-02-16: qty 10

## 2017-02-16 NOTE — Discharge Instructions (Signed)
No driving allowed until cleared by the neurologist.  Call [facility] or [facility] office tomorrow to schedule the next available appointment.  Take the medication as prescribed to prevent seizures

## 2017-02-16 NOTE — ED Triage Notes (Signed)
Pt presents in c-collar and in restraints s/p witnessed seizure.  Wife reports pt fell in kitchen, struck his head; able to sit down.  She reports pt went outside, walked dog, came back inside with 8 minute seizure.  Pt became combative, given 7.5mg  of versed.

## 2017-02-16 NOTE — ED Provider Notes (Signed)
[facility] EMERGENCY DEPARTMENT Provider Note   CSN: [id number] Arrival date & time: 02/16/17  1145     History   Chief Complaint No chief complaint on file.   HPI Justin Sutton is a 71 y.o. male.  HPI Level 5 caveat altered mental status history is obtained from EMS and from patient's wife and daughter.  Patient fell 10:30 AM today while walking striking his head his wife ran into the room and saw him seizing predominately from his right arm.  Immediately after the event patient was confused,.  EMS was called.  EMS treated patient with hard cervical collar.  Versed 4 mg IV obtained CBG which was 125.  Has history of alcohol withdrawal seizures and metabolic encephalopathy however he has not touched alcohol for several months. Past Medical History:  Diagnosis Date  . Arthritis   . Chronic cough   . Essential hypertension 02/14/2015  . GERD (gastroesophageal reflux disease)   . H/O asbestos exposure   . Hearing loss   . Heart murmur   . Hyperlipidemia   . Hypertension   . Nocturia   . Numbness    comes and goes per right hand   . OSA (obstructive sleep apnea)   . Thoracic aortic aneurysm without rupture ([place]) 02/14/2015   4.1 cm 01/2015    Patient Active Problem List   Diagnosis Date Noted  . Elevated troponin I level 06/04/2016  . Alcohol withdrawal syndrome, with delirium ([place])   . Altered mental status   . Seizure ([place]) 06/03/2016  . Alcohol dependence with withdrawal with complication ([place]) 77/41/2878  . Hypokalemia 06/03/2016  . Leukocytosis 06/03/2016  . Frequent falls 06/03/2016  . Chronic pain 06/03/2016  . Seizures ([place]) 06/03/2016  . OA (osteoarthritis) of knee 04/14/2015  . Essential hypertension 02/14/2015  . Hyperlipidemia 02/14/2015  . Aortic stenosis, mild 02/14/2015  . Thoracic aortic aneurysm without rupture ([place]) 02/14/2015  . Asbestos exposure 09/14/2012  . Cough 09/12/2012    Past Surgical History:  Procedure  Laterality Date  . CATARACT EXTRACTION  2011   bilat   . HAND SURGERY Right 1983  . HERNIA REPAIR  2011  . TONSILLECTOMY    . TOTAL KNEE ARTHROPLASTY Left 2011       Home Medications    Prior to Admission medications   Medication Sig Start Date End Date Taking? Authorizing Provider  amLODipine (NORVASC) 5 MG tablet Take 1 tablet (5 mg total) by mouth daily. 06/07/16   Arrien, Jimmy Picket, MD  aspirin 81 MG chewable tablet Chew 81 mg by mouth daily.    [provider]  atorvastatin (LIPITOR) 40 MG tablet Take 40 mg by mouth daily.    [provider]  omeprazole (PRILOSEC) 40 MG capsule Take 40 mg by mouth daily.    [provider]    Family History Family History  Problem Relation Age of Onset  . Asthma Mother   . Heart disease Father   . Prostate cancer Father     Social History Social History   Tobacco Use  . Smoking status: Former Smoker    Packs/day: 3.00    Years: 15.00    Pack years: 45.00    Types: Cigarettes    Last attempt to quit: 04/05/1970    Years since quitting: 46.9  . Smokeless tobacco: Never Used  Substance Use Topics  . Alcohol use: Yes    Comment: beer daily 2 per day   . Drug use: No  No alcohol for several months  Allergies   Patient has no known allergies.   Review of Systems Review of Systems  Unable to perform ROS: Mental status change  Musculoskeletal: Positive for gait problem.       Walks with cane  Neurological: Positive for seizures.     Physical Exam Updated Vital Signs BP (!) 142/101   Pulse 90   Temp 98 F (36.7 C) (Rectal)   Resp 18   Ht 5\' 11"  (1.803 m)   Wt 74.8 kg (165 lb)   SpO2 97%   BMI 23.01 kg/m   Physical Exam  Constitutional:  Chronically ill-appearing  HENT:  Head: Normocephalic.  Right Ear: External ear normal.  Left Ear: External ear normal.  Hematoma to right parietal area otherwise normocephalic atraumatic  Eyes: Conjunctivae and EOM are normal. Pupils are  equal, round, and reactive to light.  Neck: Neck supple. No tracheal deviation present. No thyromegaly present.  Cardiovascular: Normal rate and regular rhythm.  No murmur heard. Pulmonary/Chest: Effort normal and breath sounds normal.  Abdominal: Soft. Bowel sounds are normal. He exhibits no distension. There is no tenderness.  Musculoskeletal: Normal range of motion. He exhibits no edema or tenderness.  Neurological: He is alert. Coordination normal.  Moves all extremities cranial nerves II through XII grossly intact.  Follows simple commands.  Skin: Skin is warm and dry. No rash noted.  Psychiatric: He has a normal mood and affect.  Nursing note and vitals reviewed.    ED Treatments / Results  Labs (all labs ordered are listed, but only abnormal results are displayed) Labs Reviewed  COMPREHENSIVE METABOLIC PANEL  CBC WITH DIFFERENTIAL/PLATELET  RAPID URINE DRUG SCREEN, HOSP PERFORMED    EKG  EKG Interpretation None       Radiology No results found.  Procedures Procedures (including critical care time)  Medications Ordered in ED Medications  levETIRAcetam (KEPPRA) 1,000 mg in sodium chloride 0.9 % 100 mL IVPB (not administered)   Results for orders placed or performed during the hospital encounter of 02/16/17  Comprehensive metabolic panel  Result Value Ref Range   Sodium 135 135 - 145 mmol/L   Potassium 3.8 3.5 - 5.1 mmol/L   Chloride 103 101 - 111 mmol/L   CO2 24 22 - 32 mmol/L   Glucose, Bld 140 (H) 65 - 99 mg/dL   BUN 5 (L) 6 - 20 mg/dL   Creatinine, Ser 0.68 0.61 - 1.24 mg/dL   Calcium 9.2 8.9 - 10.3 mg/dL   Total Protein 7.3 6.5 - 8.1 g/dL   Albumin 4.3 3.5 - 5.0 g/dL   AST 31 15 - 41 U/L   ALT 25 17 - 63 U/L   Alkaline Phosphatase 66 38 - 126 U/L   Total Bilirubin 0.6 0.3 - 1.2 mg/dL   GFR calc non Af Amer >60 >60 mL/min   GFR calc Af Amer >60 >60 mL/min   Anion gap 8 5 - 15  CBC with Differential/Platelet  Result Value Ref Range   WBC 9.9 4.0 -  10.5 K/uL   RBC 4.60 4.22 - 5.81 MIL/uL   Hemoglobin 14.0 13.0 - 17.0 g/dL   HCT 41.2 39.0 - 52.0 %   MCV 89.6 78.0 - 100.0 fL   MCH 30.4 26.0 - 34.0 pg   MCHC 34.0 30.0 - 36.0 g/dL   RDW 13.3 11.5 - 15.5 %   Platelets 335 150 - 400 K/uL   Neutrophils Relative % 79 %   Neutro Abs  7.8 (H) 1.7 - 7.7 K/uL   Lymphocytes Relative 12 %   Lymphs Abs 1.2 0.7 - 4.0 K/uL   Monocytes Relative 8 %   Monocytes Absolute 0.8 0.1 - 1.0 K/uL   Eosinophils Relative 1 %   Eosinophils Absolute 0.1 0.0 - 0.7 K/uL   Basophils Relative 0 %   Basophils Absolute 0.0 0.0 - 0.1 K/uL  Rapid urine drug screen (hospital performed)  Result Value Ref Range   Opiates NONE DETECTED NONE DETECTED   Cocaine NONE DETECTED NONE DETECTED   Benzodiazepines POSITIVE (A) NONE DETECTED   Amphetamines NONE DETECTED NONE DETECTED   Tetrahydrocannabinol POSITIVE (A) NONE DETECTED   Barbiturates NONE DETECTED NONE DETECTED   Ct Head Wo Contrast  Result Date: 02/16/2017 CLINICAL DATA:  Altered mental status. Seizure-like activity today at home as patient fell striking head. EXAM: CT HEAD WITHOUT CONTRAST CT CERVICAL SPINE WITHOUT CONTRAST TECHNIQUE: Multidetector CT imaging of the head and cervical spine was performed following the standard protocol without intravenous contrast. Multiplanar CT image reconstructions of the cervical spine were also generated. COMPARISON:  Head CT 06/03/2016 FINDINGS: CT HEAD FINDINGS Brain: Ventricles, cisterns and other CSF spaces are within normal. There is mild to moderate chronic ischemic microvascular disease. There is no mass, mass effect, shift of midline structures or acute hemorrhage. No evidence of acute infarction. Vascular: No hyperdense vessel or unexpected calcification. Skull: Normal. Negative for fracture or focal lesion. Sinuses/Orbits: Orbits are normal. Paranasal sinuses are well developed and well aerated with small mucous retention cyst versus polyp over the right maxillary sinus.  Mastoid air cells are clear. Other: None. CT CERVICAL SPINE FINDINGS Alignment: Within normal. Skull base and vertebrae: There is moderate spondylosis throughout the cervical spine. Vertebral body heights are maintained. Atlantoaxial articulation is within normal. There is moderate uncovertebral joint spurring and facet arthropathy. Partial fusion of the C2-3 left-sided facet joint. No evidence of acute fracture or subluxation. Moderate to severe bilateral neural foraminal narrowing at multiple levels due to adjacent bony spurring. Soft tissues and spinal canal: No prevertebral fluid or swelling. No visible canal hematoma. Disc levels: Moderate disc space narrowing from the C3-4 level to the C6-7 level. Upper chest: Minimal emphysematous disease over the lung apices. Other: Moderate calcified plaque at the carotid bifurcations. IMPRESSION: No acute intracranial findings. Moderate chronic ischemic microvascular disease. No acute cervical spine injury. Moderate spondylosis of the cervical spine with moderate multilevel degenerative disc disease as well as significant bilateral multilevel neural foraminal narrowing. Electronically Signed   By: Marin Olp M.D.   On: 02/16/2017 13:34   Ct Cervical Spine Wo Contrast  Result Date: 02/16/2017 CLINICAL DATA:  Altered mental status. Seizure-like activity today at home as patient fell striking head. EXAM: CT HEAD WITHOUT CONTRAST CT CERVICAL SPINE WITHOUT CONTRAST TECHNIQUE: Multidetector CT imaging of the head and cervical spine was performed following the standard protocol without intravenous contrast. Multiplanar CT image reconstructions of the cervical spine were also generated. COMPARISON:  Head CT 06/03/2016 FINDINGS: CT HEAD FINDINGS Brain: Ventricles, cisterns and other CSF spaces are within normal. There is mild to moderate chronic ischemic microvascular disease. There is no mass, mass effect, shift of midline structures or acute hemorrhage. No evidence of  acute infarction. Vascular: No hyperdense vessel or unexpected calcification. Skull: Normal. Negative for fracture or focal lesion. Sinuses/Orbits: Orbits are normal. Paranasal sinuses are well developed and well aerated with small mucous retention cyst versus polyp over the right maxillary sinus. Mastoid air cells are clear.  Other: None. CT CERVICAL SPINE FINDINGS Alignment: Within normal. Skull base and vertebrae: There is moderate spondylosis throughout the cervical spine. Vertebral body heights are maintained. Atlantoaxial articulation is within normal. There is moderate uncovertebral joint spurring and facet arthropathy. Partial fusion of the C2-3 left-sided facet joint. No evidence of acute fracture or subluxation. Moderate to severe bilateral neural foraminal narrowing at multiple levels due to adjacent bony spurring. Soft tissues and spinal canal: No prevertebral fluid or swelling. No visible canal hematoma. Disc levels: Moderate disc space narrowing from the C3-4 level to the C6-7 level. Upper chest: Minimal emphysematous disease over the lung apices. Other: Moderate calcified plaque at the carotid bifurcations. IMPRESSION: No acute intracranial findings. Moderate chronic ischemic microvascular disease. No acute cervical spine injury. Moderate spondylosis of the cervical spine with moderate multilevel degenerative disc disease as well as significant bilateral multilevel neural foraminal narrowing. Electronically Signed   By: Marin Olp M.D.   On: 02/16/2017 13:34    Initial Impression / Assessment and Plan / ED Course  I have reviewed the triage vital signs and the nursing notes.  Pertinent labs & imaging results that were available during my care of the patient were reviewed by me and considered in my medical decision making (see chart for details).     4 5 PM patient is alert Glasgow Coma Score 15 denies complaint.  Ambulates without difficulty.  Looks at baseline per family members after  treatment with intravenous Keppra. Plan prescription Keppra.  Referral neurology as outpatient  Final Clinical Impressions(s) / ED Diagnoses  Diagnosis seizure disorder Final diagnoses:  None    ED Discharge Orders    None       Orlie Dakin, MD 02/16/17 [phone]

## 2017-04-01 ENCOUNTER — Encounter: Payer: Self-pay | Admitting: Neurology

## 2017-04-01 ENCOUNTER — Ambulatory Visit (INDEPENDENT_AMBULATORY_CARE_PROVIDER_SITE_OTHER): Payer: Medicare Other | Admitting: Neurology

## 2017-04-01 VITALS — BP 155/89 | HR 66 | Ht 72.0 in | Wt 172.5 lb

## 2017-04-01 DIAGNOSIS — R569 Unspecified convulsions: Secondary | ICD-10-CM | POA: Diagnosis not present

## 2017-04-01 NOTE — Progress Notes (Addendum)
Reason for visit: Seizures  Referring physician: [place]  Justin Sutton is a 71 y.o. male  History of present illness:  Justin Sutton is a 71 year old right-handed white male with a history of alcohol abuse.  The patient was in the hospital on 03 June 2016 after suffering 4 seizures.  The patient was noted to have a CK enzyme level of 1202 secondary to the seizures.  The patient was felt to have alcohol withdrawal seizures, he was not placed on anticonvulsant medications at that time.  The patient stopped drinking but had an unprovoked seizure on 16 February 2017.  The patient felt disoriented for half an hour or so prior to the seizure, the patient then had a witnessed generalized seizure event.  The patient was placed on Keppra after he went to the emergency room, CT of the brain showed a moderate level of small vessel ischemic changes.  MRI of the brain done in March 2018 showed no acute changes but a moderate level of small vessel disease again was noted.  In retrospect, the patient had his first seizure in August 2017, he did not seek medical attention at that time.  The patient recently has not been operating a motor vehicle.  He has been placed on Keppra which he is tolerating fairly well but it is causing some irritability.  The patient reports no new numbness or weakness of the face, arms, legs.  He has a history of some numbness of the right hand following trauma to the hand.  The patient denies issues controlling the bowels or the bladder, he denies headache or dizziness.  He does not bite his tongue or lose control of the bladder with the seizures.  The patient denies a family history of seizures.  He is sent to this office for an evaluation.  Past Medical History:  Diagnosis Date  . Arthritis   . Chronic cough   . Essential hypertension 02/14/2015  . GERD (gastroesophageal reflux disease)   . H/O asbestos exposure   . Hearing loss   . Heart murmur   . Hyperlipidemia   .  Hypertension   . Nocturia   . Numbness    comes and goes per right hand   . OSA (obstructive sleep apnea)   . Thoracic aortic aneurysm without rupture ([place]) 02/14/2015   4.1 cm 01/2015    Past Surgical History:  Procedure Laterality Date  . CATARACT EXTRACTION  2011   bilat   . HAND SURGERY Right 1983  . HERNIA REPAIR  2011  . TONSILLECTOMY    . TOTAL KNEE ARTHROPLASTY Left 2011  . TOTAL KNEE ARTHROPLASTY Right 04/14/2015   Procedure: RIGHT TOTAL KNEE ARTHROPLASTY;  Surgeon: Gaynelle Arabian, MD;  Location: [facility] ORS;  Service: Orthopedics;  Laterality: Right;    Family History  Problem Relation Age of Onset  . Asthma Mother   . Heart disease Father   . Prostate cancer Father     Social history:  reports that he quit smoking about 47 years ago. His smoking use included cigarettes. He has a 45.00 pack-year smoking history. he has never used smokeless tobacco. He reports that he drinks alcohol. He reports that he does not use drugs.  Medications:  Prior to Admission medications   Medication Sig Start Date End Date Taking? Authorizing Provider  amLODipine (NORVASC) 5 MG tablet Take 1 tablet (5 mg total) by mouth daily. 06/07/16  Yes Arrien, Jimmy Picket, MD  aspirin 81 MG chewable tablet Sarina Ser  81 mg by mouth daily.   Yes [provider]  atorvastatin (LIPITOR) 40 MG tablet Take 40 mg by mouth daily.   Yes [provider]  levETIRAcetam (KEPPRA) 500 MG tablet Take 1 tablet (500 mg total) 2 (two) times daily by mouth. 02/16/17  Yes Orlie Dakin, MD  Melatonin 5 MG TABS Take by mouth.   Yes [provider]  sertraline (ZOLOFT) 50 MG tablet Take 50 mg daily by mouth. 01/26/17  Yes [provider]     No Known Allergies  ROS:  Out of a complete 14 system review of symptoms, the patient complains only of the following symptoms, and all other reviewed systems are negative.  Hearing loss, hearing aids Impotence Joint pain, joint swelling Memory  loss, confusion, seizures Depression, not enough sleep, decreased energy Insomnia  Blood pressure (!) 155/89, pulse 66, height 6' (1.829 m), weight 172 lb 8 oz (78.2 kg).  Physical Exam  General: The patient is alert and cooperative at the time of the examination.  Eyes: Pupils are equal, round, and reactive to light. Discs are flat bilaterally.  Neck: The neck is supple, radiation is from the cardiac murmur are noted in both carotids.  Respiratory: The respiratory examination is notable for occasional bilateral rhonchi.  Cardiovascular: The cardiovascular examination reveals a regular rate and rhythm, a grade II/VI systolic ejection murmur is noted in the aortic area..  Skin: Extremities are without significant edema.  Neurologic Exam  Mental status: The patient is alert and oriented x 3 at the time of the examination. The patient has apparent normal recent and remote memory, with an apparently normal attention span and concentration ability.  Cranial nerves: Facial symmetry is present. There is good sensation of the face to pinprick and soft touch bilaterally. The strength of the facial muscles and the muscles to head turning and shoulder shrug are normal bilaterally. Speech is well enunciated, no aphasia or dysarthria is noted. Extraocular movements are full. Visual fields are full. The tongue is midline, and the patient has symmetric elevation of the soft palate. No obvious hearing deficits are noted.  Motor: The motor testing reveals 5 over 5 strength of all 4 extremities. Good symmetric motor tone is noted throughout.  Sensory: Sensory testing is intact to pinprick, soft touch, vibration sensation, and position sense on all 4 extremities, with exception of some decreased position sensation in both feet. No evidence of extinction is noted.  Coordination: Cerebellar testing reveals good finger-nose-finger and heel-to-shin bilaterally.  Gait and station: Gait is normal. Tandem gait  is slightly unsteady.  Romberg is negative. No drift is seen.  Reflexes: Deep tendon reflexes are symmetric and normal bilaterally, with exception of some decreased ankle jerk reflexes bilaterally. Toes are downgoing bilaterally.   CT head and cervical 02/16/17:  IMPRESSION: No acute intracranial findings.  Moderate chronic ischemic microvascular disease.  No acute cervical spine injury.  Moderate spondylosis of the cervical spine with moderate multilevel degenerative disc disease as well as significant bilateral multilevel neural foraminal narrowing.  * CT scan images were reviewed online. I agree with the written report.   EEG 06/04/16:  Clinical Interpretation: This normal EEG is recorded in the waking and sleep state. The excessive beta activity is associated with medication effect. There was no seizure or seizure predisposition recorded on this study. Please note that a normal EEG does not preclude the possibility of epilepsy.   Assessment/Plan:  1.  History of seizures  2.  History of alcohol abuse  The patient has recent been placed on Keppra which is appropriate.  Urine drug screen was positive for THC and benzodiazepines.  The patient will be sent for a repeat EEG evaluation.  The seizures may be related to the moderate level of small vessel disease noted above.  He will continue the [place] for now but if the irritability worsens, this medication may need to be discontinued.  He will follow-up in 6 months.  He is not to operate a motor vehicle for 6 months following the last seizure.  Jill Alexanders MD 04/01/2017 10:45 AM  [facility] [address] [place], [place] [address]  Phone [phone] Fax [phone]

## 2017-04-06 DIAGNOSIS — E7800 Pure hypercholesterolemia, unspecified: Secondary | ICD-10-CM | POA: Diagnosis not present

## 2017-04-06 DIAGNOSIS — R569 Unspecified convulsions: Secondary | ICD-10-CM | POA: Diagnosis not present

## 2017-04-06 DIAGNOSIS — I10 Essential (primary) hypertension: Secondary | ICD-10-CM | POA: Diagnosis not present

## 2017-04-11 DIAGNOSIS — Z86010 Personal history of colonic polyps: Secondary | ICD-10-CM | POA: Diagnosis not present

## 2017-04-11 DIAGNOSIS — G43A0 Cyclical vomiting, not intractable: Secondary | ICD-10-CM | POA: Diagnosis not present

## 2017-04-12 ENCOUNTER — Telehealth: Payer: Self-pay | Admitting: Neurology

## 2017-04-12 MED ORDER — LEVETIRACETAM 500 MG PO TABS: 500.0000 mg | ORAL_TABLET | Freq: Two times a day (BID) | ORAL | 5 refills | Status: DC

## 2017-04-12 NOTE — Telephone Encounter (Addendum)
Tried calling pt back x2. Automated message states pt blocked our phone number. Faxed printed/signed rx keppra to pharmacy as requested. Fax: [phone]. Received fax confirmation.

## 2017-04-12 NOTE — Telephone Encounter (Signed)
Pt is wanting to know if we can refill levETIRAcetam (KEPPRA) 500 MG tablet Pt said it was originally prescribed at the hospital so pt wasn't sure if we could.

## 2017-04-14 ENCOUNTER — Telehealth: Payer: Self-pay | Admitting: Neurology

## 2017-04-14 ENCOUNTER — Ambulatory Visit (INDEPENDENT_AMBULATORY_CARE_PROVIDER_SITE_OTHER): Payer: Medicare Other | Admitting: Neurology

## 2017-04-14 DIAGNOSIS — R569 Unspecified convulsions: Secondary | ICD-10-CM

## 2017-04-14 NOTE — Telephone Encounter (Signed)
I called the patient.  The EEG study was unremarkable, he will remain on the [place].  He indicates that the irritability is no longer an issue for him on this medication.  He will let me know if another seizure occurs.

## 2017-04-14 NOTE — Procedures (Signed)
    History:  Justin Sutton is a 72 year old gentleman with a history of alcohol abuse.  The patient was in the hospital in March 2018 following several seizures.  The patient stopped drinking but had an unprovoked seizure on 16 February 2017.  The patient has a witnessed generalized seizure event at that time.  The patient has been placed on Keppra and he is being evaluated for the seizure events.  This is a routine EEG.  No skull defects are noted.  Medications include amlodipine, Zoloft, melatonin, aspirin, Lipitor, and Keppra.  EEG classification: Normal awake  Description of the recording: The background rhythms of this recording consists of a fairly well modulated medium amplitude alpha rhythm of 9 Hz that is reactive to eye opening and closure. As the record progresses, the patient appears to remain in the waking state throughout the recording. Photic stimulation was performed, resulting in a bilateral and symmetric photic driving response. Hyperventilation was also performed, resulting in a minimal buildup of the background rhythm activities without significant slowing seen. At no time during the recording does there appear to be evidence of spike or spike wave discharges or evidence of focal slowing. EKG monitor shows no evidence of cardiac rhythm abnormalities with a heart rate of 60.  Occasional premature ventricular complexes were seen.  Impression: This is a normal EEG recording in the waking state. No evidence of ictal or interictal discharges are seen.

## 2017-07-23 ENCOUNTER — Other Ambulatory Visit: Payer: Self-pay | Admitting: Neurology

## 2017-09-17 ENCOUNTER — Other Ambulatory Visit: Payer: Self-pay | Admitting: Neurology

## 2017-10-03 ENCOUNTER — Ambulatory Visit: Payer: Self-pay | Admitting: Nurse Practitioner

## 2017-10-05 DIAGNOSIS — Z125 Encounter for screening for malignant neoplasm of prostate: Secondary | ICD-10-CM | POA: Diagnosis not present

## 2017-10-05 DIAGNOSIS — M19049 Primary osteoarthritis, unspecified hand: Secondary | ICD-10-CM | POA: Diagnosis not present

## 2017-10-05 DIAGNOSIS — I10 Essential (primary) hypertension: Secondary | ICD-10-CM | POA: Diagnosis not present

## 2017-10-05 DIAGNOSIS — I739 Peripheral vascular disease, unspecified: Secondary | ICD-10-CM | POA: Diagnosis not present

## 2017-10-05 DIAGNOSIS — Z79899 Other long term (current) drug therapy: Secondary | ICD-10-CM | POA: Diagnosis not present

## 2017-10-05 DIAGNOSIS — Z0001 Encounter for general adult medical examination with abnormal findings: Secondary | ICD-10-CM | POA: Diagnosis not present

## 2017-12-22 ENCOUNTER — Other Ambulatory Visit: Payer: Self-pay | Admitting: Neurology

## 2017-12-22 NOTE — Telephone Encounter (Signed)
Refilled x 3 months. Patient has FU scheduled.

## 2018-04-01 ENCOUNTER — Other Ambulatory Visit: Payer: Self-pay | Admitting: Neurology

## 2018-04-21 ENCOUNTER — Ambulatory Visit (INDEPENDENT_AMBULATORY_CARE_PROVIDER_SITE_OTHER): Payer: Medicare Other | Admitting: Neurology

## 2018-04-21 ENCOUNTER — Encounter: Payer: Self-pay | Admitting: Neurology

## 2018-04-21 VITALS — BP 118/65 | HR 65 | Ht 72.0 in | Wt 162.3 lb

## 2018-04-21 DIAGNOSIS — R569 Unspecified convulsions: Secondary | ICD-10-CM

## 2018-04-21 MED ORDER — LEVETIRACETAM 500 MG PO TABS: ORAL_TABLET | ORAL | 3 refills | Status: DC

## 2018-04-21 NOTE — Progress Notes (Signed)
Reason for visit: Seizures  Justin Sutton is an 73 y.o. male  History of present illness:  Justin Sutton is a 73 year old right-handed white male with a history of seizures, he has been on Keppra and has been well controlled on 500 mg twice daily.  He has a history of alcohol abuse but he has not had any alcohol in 4 years.  The patient is back to driving a car, he is doing well with this.  He tolerates the Keppra well, he does have some underlying anxiety issues but he does not believe that the [name] has worsened this.  The patient is on Zoloft in low-dose, taking 50 mg daily.  He at times has some troubles with insomnia.  He returns for an evaluation.  No further seizures have been noted over the last year.  Past Medical History:  Diagnosis Date  . Arthritis   . Chronic cough   . Essential hypertension 02/14/2015  . GERD (gastroesophageal reflux disease)   . H/O asbestos exposure   . Hearing loss   . Heart murmur   . Hyperlipidemia   . Hypertension   . Nocturia   . Numbness    comes and goes per right hand   . OSA (obstructive sleep apnea)   . Thoracic aortic aneurysm without rupture ([place]) 02/14/2015   4.1 cm 01/2015    Past Surgical History:  Procedure Laterality Date  . CATARACT EXTRACTION  2011   bilat   . HAND SURGERY Right 1983  . HERNIA REPAIR  2011  . TONSILLECTOMY    . TOTAL KNEE ARTHROPLASTY Left 2011  . TOTAL KNEE ARTHROPLASTY Right 04/14/2015   Procedure: RIGHT TOTAL KNEE ARTHROPLASTY;  Surgeon: Gaynelle Arabian, MD;  Location: [facility];  Service: Orthopedics;  Laterality: Right;    Family History  Problem Relation Age of Onset  . Asthma Mother   . Heart disease Father   . Prostate cancer Father     Social history:  reports that he quit smoking about 48 years ago. His smoking use included cigarettes. He has a 45.00 pack-year smoking history. He has never used smokeless tobacco. He reports current alcohol use. He reports that he does not use drugs.   No  Known Allergies  Medications:  Prior to Admission medications   Medication Sig Start Date End Date Taking? Authorizing Provider  amLODipine (NORVASC) 5 MG tablet Take 1 tablet (5 mg total) by mouth daily. 06/07/16   Arrien, Jimmy Picket, MD  aspirin 81 MG chewable tablet Chew 81 mg by mouth daily.    [provider]  atorvastatin (LIPITOR) 40 MG tablet Take 40 mg by mouth daily.    [provider]  levETIRAcetam (KEPPRA) 500 MG tablet Take 1 tablet by mouth 2 times daily. 04/03/18   Kathrynn Ducking, MD  Melatonin 5 MG TABS Take by mouth.    [provider]  sertraline (ZOLOFT) 50 MG tablet Take 50 mg daily by mouth. 01/26/17   [provider]    ROS:  Out of a complete 14 system review of symptoms, the patient complains only of the following symptoms, and all other reviewed systems are negative.  Insomnia Agitation, anxiety  There were no vitals taken for this visit.  Physical Exam  General: The patient is alert and cooperative at the time of the examination.  Skin: No significant peripheral edema is noted.   Neurologic Exam  Mental status: The patient is alert and oriented x 3 at the  time of the examination. The patient has apparent normal recent and remote memory, with an apparently normal attention span and concentration ability.   Cranial nerves: Facial symmetry is present. Speech is normal, no aphasia or dysarthria is noted. Extraocular movements are full. Visual fields are full.  Motor: The patient has good strength in all 4 extremities.  Sensory examination: Soft touch sensation is symmetric on the face, arms, and legs.  Coordination: The patient has good finger-nose-finger and heel-to-shin bilaterally.  Gait and station: The patient has a normal gait. Tandem gait is normal. Romberg is negative. No drift is seen.  Reflexes: Deep tendon reflexes are symmetric.   Assessment/Plan:  1.  History of seizures  The patient is  doing well on Keppra, will continue the medication, I will send in a prescription and he will follow-up in 1 year.  He will call if he has any problems or concerns.  Jill Alexanders MD 04/21/2018 7:50 AM  [facility] [address] [place], [place] [address]  Phone [phone] Fax [phone]

## 2018-05-01 ENCOUNTER — Ambulatory Visit (INDEPENDENT_AMBULATORY_CARE_PROVIDER_SITE_OTHER): Payer: Medicare Other | Admitting: Cardiovascular Disease

## 2018-05-01 ENCOUNTER — Encounter: Payer: Self-pay | Admitting: Cardiovascular Disease

## 2018-05-01 VITALS — BP 112/70 | HR 61 | Ht 71.0 in | Wt 162.6 lb

## 2018-05-01 DIAGNOSIS — I2584 Coronary atherosclerosis due to calcified coronary lesion: Secondary | ICD-10-CM

## 2018-05-01 DIAGNOSIS — Z5181 Encounter for therapeutic drug level monitoring: Secondary | ICD-10-CM | POA: Diagnosis not present

## 2018-05-01 DIAGNOSIS — I10 Essential (primary) hypertension: Secondary | ICD-10-CM

## 2018-05-01 DIAGNOSIS — I2510 Atherosclerotic heart disease of native coronary artery without angina pectoris: Secondary | ICD-10-CM | POA: Diagnosis not present

## 2018-05-01 DIAGNOSIS — I712 Thoracic aortic aneurysm, without rupture: Secondary | ICD-10-CM | POA: Diagnosis not present

## 2018-05-01 DIAGNOSIS — E785 Hyperlipidemia, unspecified: Secondary | ICD-10-CM

## 2018-05-01 DIAGNOSIS — I7121 Aneurysm of the ascending aorta, without rupture: Secondary | ICD-10-CM

## 2018-05-01 DIAGNOSIS — I350 Nonrheumatic aortic (valve) stenosis: Secondary | ICD-10-CM

## 2018-05-01 LAB — LIPID PANEL
CHOLESTEROL TOTAL: 133 mg/dL (ref 100–199)
Chol/HDL Ratio: 2 ratio (ref 0.0–5.0)
HDL: 68 mg/dL (ref >39)
LDL Calculated: 53 mg/dL (ref 0–99)
TRIGLYCERIDES: 59 mg/dL (ref 0–149)
VLDL Cholesterol Cal: 12 mg/dL (ref 5–40)

## 2018-05-01 LAB — COMPREHENSIVE METABOLIC PANEL
A/G RATIO: 2.3 — AB (ref 1.2–2.2)
ALT: 24 IU/L (ref 0–44)
AST: 29 IU/L (ref 0–40)
Albumin: 4.3 g/dL (ref 3.7–4.7)
Alkaline Phosphatase: 67 IU/L (ref 39–117)
BILIRUBIN TOTAL: 0.3 mg/dL (ref 0.0–1.2)
BUN/Creatinine Ratio: 10 (ref 10–24)
BUN: 8 mg/dL (ref 8–27)
CALCIUM: 8.7 mg/dL (ref 8.6–10.2)
CHLORIDE: 99 mmol/L (ref 96–106)
CO2: 19 mmol/L — ABNORMAL LOW (ref 20–29)
Creatinine, Ser: 0.8 mg/dL (ref 0.76–1.27)
GFR calc Af Amer: 103 mL/min/{1.73_m2} (ref >59)
GFR, EST NON AFRICAN AMERICAN: 89 mL/min/{1.73_m2} (ref >59)
Globulin, Total: 1.9 g/dL (ref 1.5–4.5)
Glucose: 116 mg/dL — ABNORMAL HIGH (ref 65–99)
POTASSIUM: 4 mmol/L (ref 3.5–5.2)
Sodium: 134 mmol/L (ref 134–144)
TOTAL PROTEIN: 6.2 g/dL (ref 6.0–8.5)

## 2018-05-01 NOTE — Patient Instructions (Signed)
Medication Instructions:  Your physician recommends that you continue on your current medications as directed. Please refer to the Current Medication list given to you today.  If you need a refill on your cardiac medications before your next appointment, please call your pharmacy.   Lab work: LP/CMET TODAY   If you have labs (blood work) drawn today and your tests are completely normal, you will receive your results only by: Marland Kitchen MyChart Message (if you have MyChart) OR . A paper copy in the mail If you have any lab test that is abnormal or we need to change your treatment, we will call you to review the results.  Testing/Procedures: Your physician has requested that you have an echocardiogram. Echocardiography is a painless test that uses sound waves to create images of your heart. It provides your doctor with information about the size and shape of your heart and how well your heart's chambers and valves are working. This procedure takes approximately one hour. There are no restrictions for this procedure. [place] [address]  Follow-Up: At [facility], you and your health needs are our priority.  As part of our continuing mission to provide you with exceptional heart care, we have created designated Provider Care Teams.  These Care Teams include your primary Cardiologist (physician) and Advanced Practice Providers (APPs -  Physician Assistants and Nurse Practitioners) who all work together to provide you with the care you need, when you need it. You will need a follow up appointment in 12 months.  Please call our office 2 months in advance to schedule this appointment.  You may see DR [facility] or one of the following Advanced Practice Providers on your designated Care Team:   Kerin Ransom, PA-C Roby Lofts, [place] . Sande Rives, PA-C

## 2018-05-01 NOTE — Progress Notes (Signed)
Cardiology Office Note   Date:  05/01/2018   ID:  Justin Sutton, DOB 06/06/1945, MRN [id number]  PCP:  Lujean Amel, MD  Cardiologist:   Skeet Latch, MD   No chief complaint on file.    History of Present Illness: Justin Sutton is a 73 y.o. male with aortic stenosis, aortic aneurysm, coronary calcification, vasovagal syncope, hypertension, hyperlipidemia, peripheral arterial disease, prior EtOH abuse, and asbestos exposure here to re-establsih care.  He was initially seen in 2016 for presurgical risk assessment. Justin Sutton saw his PCP, Dr. Kinnie Feil and surgical clearance for a knee surgery.  He was incidentally noted to have coronary calcification on noncontrast head CT.  Echo 01/2015 showed mild aortic stenosis with a mean gradient of 12 mmHg.  it also showed a mild ascending aortic aneurysm.  At the time he was exercising regularly and had no symptoms.  He was felt to be low risk for surgery.  It was recommended that he follow-up in 1 year but has not been seen since that time.  Since his last appointment Justin Sutton has been feeling well.  He did struggle with some seizures and has seen neurology.  His last seizure was over a year ago.  He is now on Keppra and this is controlling it well.  He has not had any alcohol in several years.  He stays very active.  He does yard work and goes to [employer] twice per week.  He has no exertional chest pain or shortness of breath.  He also denies lower extremity edema, orthopnea, or PND.  His blood pressure at home is always well-controlled.  He has been struggling with his attitude but has been feeling much better and more in control of his emotions lately.  His only complaint is some arthritis in his toes that causes discomfort when he walks.   Past Medical History:  Diagnosis Date  . Arthritis   . Chronic cough   . Essential hypertension 02/14/2015  . GERD (gastroesophageal reflux disease)   . H/O asbestos exposure   . Hearing  loss   . Heart murmur   . Hyperlipidemia   . Hypertension   . Nocturia   . Numbness    comes and goes per right hand   . OSA (obstructive sleep apnea)   . Thoracic aortic aneurysm without rupture ([place]) 02/14/2015   4.1 cm 01/2015    Past Surgical History:  Procedure Laterality Date  . CATARACT EXTRACTION  2011   bilat   . HAND SURGERY Right 1983  . HERNIA REPAIR  2011  . TONSILLECTOMY    . TOTAL KNEE ARTHROPLASTY Left 2011  . TOTAL KNEE ARTHROPLASTY Right 04/14/2015   Procedure: RIGHT TOTAL KNEE ARTHROPLASTY;  Surgeon: Gaynelle Arabian, MD;  Location: [facility];  Service: Orthopedics;  Laterality: Right;     Current Outpatient Medications  Medication Sig Dispense Refill  . amLODipine (NORVASC) 5 MG tablet Take 1 tablet (5 mg total) by mouth daily. 30 tablet 0  . aspirin 81 MG chewable tablet Chew 81 mg by mouth daily.    Marland Kitchen atorvastatin (LIPITOR) 40 MG tablet Take 40 mg by mouth daily.    Marland Kitchen levETIRAcetam (KEPPRA) 500 MG tablet Take 1 tablet by mouth 2 times daily. 180 tablet 3  . Melatonin 5 MG TABS Take by mouth.    Marland Kitchen omeprazole (PRILOSEC) 40 MG capsule Take 40 mg by mouth daily.    . sertraline (ZOLOFT) 50 MG tablet Take 50  mg daily by mouth.  0   No current facility-administered medications for this visit.     Allergies:   Patient has no known allergies.    Social History:  The patient  reports that he quit smoking about 48 years ago. His smoking use included cigarettes. He has a 45.00 pack-year smoking history. He has never used smokeless tobacco. He reports current alcohol use. He reports that he does not use drugs.   Family History:  The patient's family history includes Asthma in his mother; Heart disease in his father; Prostate cancer in his father.    ROS:  Please see the history of present illness.   Otherwise, review of systems are positive for none.   All other systems are reviewed and negative.    PHYSICAL EXAM: VS:  BP 112/70   Pulse 61   Ht 5\' 11"  (1.803 m)    Wt 162 lb 9.6 oz (73.8 kg)   BMI 22.68 kg/m  , BMI Body mass index is 22.68 kg/m. GENERAL:  Well appearing HEENT: Pupils equal round and reactive, fundi not visualized, oral mucosa unremarkable NECK:  No jugular venous distention, waveform within normal limits, carotid upstroke brisk and symmetric, no bruits, no thyromegaly LYMPHATICS:  No cervical adenopathy LUNGS:  Clear to auscultation bilaterally HEART:  RRR.  PMI not displaced or sustained,S1 and S2 within normal limits, no S3, no S4, no clicks, no rubs, III/VI mid-peaking systolic murmur at the LUSB ABD:  Flat, positive bowel sounds normal in frequency in pitch, no bruits, no rebound, no guarding, no midline pulsatile mass, no hepatomegaly, no splenomegaly EXT:  2 plus pulses throughout, no edema, no cyanosis no clubbing SKIN:  No rashes no nodules NEURO:  Cranial nerves II through XII grossly intact, motor grossly intact throughout PSYCH:  Cognitively intact, oriented to person place and time   EKG:  EKG is ordered today. 05/01/18: Sinus rhythm.  Rate 61 bpm.     Echo 01/27/15: Study Conclusions  - Left ventricle: The cavity size was normal. Wall thickness was increased in a pattern of mild LVH. Systolic function was normal. The estimated ejection fraction was in the range of 55% to 60%. Wall motion was normal; there were no regional wall motion abnormalities. Doppler parameters are consistent with abnormal left ventricular relaxation (grade 1 diastolic dysfunction). The E/e&' ratio is between 8-15, suggesting indeterminate LV filling pressure. - Aortic valve: Trileaflet; mildly calcified leaflets. Mild stenosis. There was trivial regurgitation. Mean gradient (S): 12 mm Hg. Peak gradient (S): 30 mm Hg. Valve area (VTI): 1.78 cm^2. Valve area (Vmean): 1.76 cm^2. - Aorta: Aortic root dimension: 41 mm (ED). - Ascending aorta: The ascending aorta was mildly dilated. - Mitral valve: Calcified annulus.  There was trivial regurgitation. - Left atrium: The atrium was normal in size. - Right atrium: The atrium was mildly dilated. - Atrial septum: Mobile IAS. PFO cannot be excluded. - Tricuspid valve: There was mild regurgitation. - Pulmonary arteries: PA peak pressure: 30 mm Hg (S). - Inferior vena cava: The vessel was normal in size. The respirophasic diameter changes were in the normal range (= 50%), consistent with normal central venous pressure.  Impressions:  - LVEF 55-60%, mild LVH, normal wall motion, diastolic dysfunction, indeterminate LV filling pressure, mild aortic valve stenosis (AVA 1.7-1.8 cm2) with trivial AI, dilated aortic root and ascending aorta to 4.1 cm, mild RAE, mobile IAS - PFO cannot be excluded, mild TR, RVSP 30 mmHg.   Recent Labs: No results found for requested  labs within last 8760 hours.   01/20/15: WBC 7.3, Hgb 13.9, Plt 248 Na 137, K 4.6, BUN 13, Creatinine 0.77 AST 19, ALT 19  10/05/2017: Total cholesterol 245, triglycerides 122, HDL 65, LDL 156  Lipid Panel No results found for: CHOL, TRIG, HDL, CHOLHDL, VLDL, LDLCALC, LDLDIRECT    Wt Readings from Last 3 Encounters:  05/01/18 162 lb 9.6 oz (73.8 kg)  04/21/18 162 lb 5 oz (73.6 kg)  04/01/17 172 lb 8 oz (78.2 kg)      ASSESSMENT AND PLAN:  # Hypertension: BP is well-controlled on amlodipine.   # Coronary calcification: # Hyperlipidemia: Coronary calcification previously noted on CT scan.  He is asymptomatic.  Continue aspirin and atorvastatin.  LDL was 156 on 10/2017.  However he does not think he was taking atorvastatin at that time.  Repeat lipids and CMP today.   # Aortic stenosis: Mild AS by echo in 2016.  It sounds moderate on exam.  Repeat echo.    #: Ascending aortic aneurysm: Mild ascending thoracic aortic aneurysm.  Repeat echo as above.  # Alcohol abuse: No EtOH in years.   Current medicines are reviewed at length with the patient today.  The patient does not  have concerns regarding medicines.  The following changes have been made:  no change  Labs/ tests ordered today include:   Orders Placed This Encounter  Procedures  . Lipid panel  . Comprehensive metabolic panel  . EKG 12-Lead  . ECHOCARDIOGRAM COMPLETE     Disposition:   FU with Allister Lessley C. Oval Linsey, MD in 1 year.   Signed, Skeet Latch, MD  05/01/2018 8:42 AM    [facility]

## 2018-05-05 ENCOUNTER — Ambulatory Visit (HOSPITAL_COMMUNITY): Payer: Medicare Other | Attending: Cardiovascular Disease

## 2018-05-05 DIAGNOSIS — I350 Nonrheumatic aortic (valve) stenosis: Secondary | ICD-10-CM | POA: Insufficient documentation

## 2018-05-05 DIAGNOSIS — I712 Thoracic aortic aneurysm, without rupture: Secondary | ICD-10-CM | POA: Insufficient documentation

## 2018-05-05 DIAGNOSIS — I7121 Aneurysm of the ascending aorta, without rupture: Secondary | ICD-10-CM

## 2018-05-31 ENCOUNTER — Telehealth: Payer: Self-pay | Admitting: *Deleted

## 2018-05-31 DIAGNOSIS — I7121 Aneurysm of the ascending aorta, without rupture: Secondary | ICD-10-CM

## 2018-05-31 DIAGNOSIS — I2510 Atherosclerotic heart disease of native coronary artery without angina pectoris: Secondary | ICD-10-CM

## 2018-05-31 DIAGNOSIS — I350 Nonrheumatic aortic (valve) stenosis: Secondary | ICD-10-CM

## 2018-05-31 DIAGNOSIS — I10 Essential (primary) hypertension: Secondary | ICD-10-CM

## 2018-05-31 DIAGNOSIS — I712 Thoracic aortic aneurysm, without rupture: Secondary | ICD-10-CM

## 2018-05-31 DIAGNOSIS — E785 Hyperlipidemia, unspecified: Secondary | ICD-10-CM

## 2018-05-31 DIAGNOSIS — I2584 Coronary atherosclerosis due to calcified coronary lesion: Principal | ICD-10-CM

## 2018-05-31 NOTE — Telephone Encounter (Signed)
Advised patient, verbalized understanding. Message sent to scheduling to arrange myoview. Patient stated he did not thing walking on treadmill would be a problem. Order placed for repeat Echo and stress myoview

## 2018-05-31 NOTE — Telephone Encounter (Signed)
-----   Message from Skeet Latch, MD sent at 05/13/2018  9:23 PM EST ----- Echo shows that his heart muscle is a little weaker than when he last had an echo.  Given this and the calcification on his chest CT, we should get a stress test.  Recommend a Lexiscan Myoview given that his feet hurt when he walks (rather than a treadmill).  If he thinks he can walk that is OK instead.  Still mild aortic stenosis and ascending aorta aneurysm is slightly larger.  Repeat echo in 1 year.

## 2018-06-01 ENCOUNTER — Telehealth (HOSPITAL_COMMUNITY): Payer: Self-pay

## 2018-06-01 NOTE — Telephone Encounter (Signed)
Encounter complete. 

## 2018-06-02 ENCOUNTER — Ambulatory Visit (HOSPITAL_COMMUNITY)
Admission: RE | Admit: 2018-06-02 | Discharge: 2018-06-02 | Disposition: A | Discharge status: Home / Self Care | Payer: Medicare Other | Source: Ambulatory Visit | Attending: Cardiology | Admitting: Cardiology

## 2018-06-02 DIAGNOSIS — I2510 Atherosclerotic heart disease of native coronary artery without angina pectoris: Secondary | ICD-10-CM | POA: Diagnosis not present

## 2018-06-02 DIAGNOSIS — E785 Hyperlipidemia, unspecified: Secondary | ICD-10-CM | POA: Diagnosis not present

## 2018-06-02 DIAGNOSIS — I10 Essential (primary) hypertension: Secondary | ICD-10-CM | POA: Diagnosis not present

## 2018-06-02 LAB — MYOCARDIAL PERFUSION IMAGING
CHL CUP NUCLEAR SRS: 0
CSEPPHR: 81 {beats}/min
LV sys vol: 76 mL
LVDIAVOL: 152 mL (ref 62–150)
Rest HR: 61 {beats}/min
SDS: 1
SSS: 1
TID: 1.12

## 2018-06-02 MED ORDER — TECHNETIUM TC 99M TETROFOSMIN IV KIT
31.0000 | PACK | Freq: Once | INTRAVENOUS | Status: AC | PRN
  Administered 2018-06-02: 31 via INTRAVENOUS
  Filled 2018-06-02: qty 31

## 2018-06-02 MED ORDER — ADENOSINE (DIAGNOSTIC) 3 MG/ML IV SOLN
0.5600 mg/kg | Freq: Once | INTRAVENOUS | Status: AC
  Administered 2018-06-02: 41.1 mg via INTRAVENOUS

## 2018-06-02 MED ORDER — ADENOSINE (DIAGNOSTIC) 3 MG/ML IV SOLN: 0.5600 mg/kg | Freq: Once | INTRAVENOUS | Status: DC

## 2018-06-02 MED ORDER — TECHNETIUM TC 99M TETROFOSMIN IV KIT
10.7000 | PACK | Freq: Once | INTRAVENOUS | Status: AC | PRN
  Administered 2018-06-02: 10.7 via INTRAVENOUS
  Filled 2018-06-02: qty 11

## 2019-02-16 DIAGNOSIS — M2042 Other hammer toe(s) (acquired), left foot: Secondary | ICD-10-CM | POA: Diagnosis not present

## 2019-02-16 DIAGNOSIS — Z0001 Encounter for general adult medical examination with abnormal findings: Secondary | ICD-10-CM | POA: Diagnosis not present

## 2019-02-16 DIAGNOSIS — M2041 Other hammer toe(s) (acquired), right foot: Secondary | ICD-10-CM | POA: Diagnosis not present

## 2019-02-16 DIAGNOSIS — Z1211 Encounter for screening for malignant neoplasm of colon: Secondary | ICD-10-CM | POA: Diagnosis not present

## 2019-02-16 DIAGNOSIS — R159 Full incontinence of feces: Secondary | ICD-10-CM | POA: Diagnosis not present

## 2019-02-16 DIAGNOSIS — E7800 Pure hypercholesterolemia, unspecified: Secondary | ICD-10-CM | POA: Diagnosis not present

## 2019-02-16 DIAGNOSIS — Z79899 Other long term (current) drug therapy: Secondary | ICD-10-CM | POA: Diagnosis not present

## 2019-02-16 DIAGNOSIS — I739 Peripheral vascular disease, unspecified: Secondary | ICD-10-CM | POA: Diagnosis not present

## 2019-02-16 DIAGNOSIS — I10 Essential (primary) hypertension: Secondary | ICD-10-CM | POA: Diagnosis not present

## 2019-02-16 DIAGNOSIS — I358 Other nonrheumatic aortic valve disorders: Secondary | ICD-10-CM | POA: Diagnosis not present

## 2019-02-16 DIAGNOSIS — Z125 Encounter for screening for malignant neoplasm of prostate: Secondary | ICD-10-CM | POA: Diagnosis not present

## 2019-03-23 ENCOUNTER — Other Ambulatory Visit: Payer: Self-pay | Admitting: Family Medicine

## 2019-03-23 ENCOUNTER — Ambulatory Visit
Admission: RE | Admit: 2019-03-23 | Discharge: 2019-03-23 | Disposition: A | Discharge status: Home / Self Care | Payer: Medicare Other | Source: Ambulatory Visit | Attending: Family Medicine | Admitting: Family Medicine

## 2019-03-23 DIAGNOSIS — R05 Cough: Secondary | ICD-10-CM

## 2019-03-23 DIAGNOSIS — R059 Cough, unspecified: Secondary | ICD-10-CM

## 2019-03-28 ENCOUNTER — Encounter: Payer: Self-pay | Admitting: Pulmonary Disease

## 2019-03-28 ENCOUNTER — Ambulatory Visit (INDEPENDENT_AMBULATORY_CARE_PROVIDER_SITE_OTHER): Payer: Medicare Other | Admitting: Pulmonary Disease

## 2019-03-28 ENCOUNTER — Other Ambulatory Visit: Payer: Self-pay

## 2019-03-28 VITALS — BP 132/72 | HR 64 | Temp 97.9°F | Ht 68.75 in | Wt 177.4 lb

## 2019-03-28 DIAGNOSIS — R05 Cough: Secondary | ICD-10-CM

## 2019-03-28 DIAGNOSIS — R0609 Other forms of dyspnea: Secondary | ICD-10-CM

## 2019-03-28 DIAGNOSIS — R0600 Dyspnea, unspecified: Secondary | ICD-10-CM | POA: Diagnosis not present

## 2019-03-28 DIAGNOSIS — Z77090 Contact with and (suspected) exposure to asbestos: Secondary | ICD-10-CM

## 2019-03-28 DIAGNOSIS — I2510 Atherosclerotic heart disease of native coronary artery without angina pectoris: Secondary | ICD-10-CM | POA: Diagnosis not present

## 2019-03-28 DIAGNOSIS — Z87891 Personal history of nicotine dependence: Secondary | ICD-10-CM | POA: Diagnosis not present

## 2019-03-28 DIAGNOSIS — R059 Cough, unspecified: Secondary | ICD-10-CM

## 2019-03-28 NOTE — Progress Notes (Signed)
  Subjective:     Patient ID: Justin Sutton, male   DOB: [birth year], 73 y.o.   MRN: [id number]  HPI   Review of Systems  Constitutional: Negative.   HENT: Positive for hearing loss.   Eyes: Negative.   Respiratory: Positive for cough and shortness of breath.   Cardiovascular: Negative.   Gastrointestinal: Negative.   Endocrine: Negative.   Genitourinary: Negative.   Musculoskeletal: Positive for joint swelling.  Skin: Negative.   Allergic/Immunologic: Negative.   Neurological: Negative.   Hematological: Negative.   Psychiatric/Behavioral: Negative.        Objective:   Physical Exam     Assessment:         Plan:

## 2019-03-28 NOTE — Progress Notes (Signed)
[facility]  Chief Complaint  Patient presents with  . Consult    Shortness of breath, cough    Constitutional:  BP 132/72 (BP Location: Right Arm, Patient Position: Sitting, Cuff Size: Normal)   Pulse 64   Temp 97.9 F (36.6 C)   Ht 5' 8.75" (1.746 m)   Wt 177 lb 6.4 oz (80.5 kg)   SpO2 98% Comment: on room air  BMI 26.39 kg/m   Past Medical History:  HTN, GERD, HLD, OSA, Seizures  Brief Summary:  Justin Sutton is a 73 y.o. male former smoker with cough and dyspnea.  He was seen previously by Dr. Melvyn Novas for cough.    He was in the [employer] from 1964 to 1971.  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  Had significant exposure to asbestos.  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  Had dust exposure.  Used to smoke 2 ppd and quit about 40 yrs ago.  Has seasonal allergies.  Gets winded after walking up stairs.  Has dry cough.  Not having wheeze, sputum, fever, chest pain, palpitations or hemoptysis.  No history of pneumonia, TB, or thromboembolic disease.  Not currently on inhalers.  Physical Exam:   Appearance - well kempt   ENMT - clear nasal mucosa, midline nasal  septum, no oral exudates, no LAN, trachea midline  Respiratory - normal chest wall, normal respiratory effort, no accessory muscle use, no wheeze/rales  CV - s1s2 regular rate and rhythm, no murmurs, no peripheral edema, radial pulses symmetric  GI - soft, non tender, no masses  Lymph - no adenopathy noted in neck and axillary areas  MSK - uses a cane  Ext - no cyanosis, clubbing, or joint inflammation noted  Skin - no rashes, lesions, or ulcers  Neuro - normal strength, oriented x 3  Psych - normal mood and affect  Discussion:  He has dyspnea on exertion and non productive cough.  Has prior history of smoking.  Has military exposure to asbestos.  Has occupational exposure to wood dust, and wood staining fumes.  Assessment/Plan:   Dyspnea on exertion  with cough. - will arrange for HRCT chest and PFT to further assess for asbestos related lung disease and/or emphysema  CAD, HLD, HTN, mild Aortic Stenosis. - followed by Dr. Oval Linsey with cardiology  Seizure disorder. - followed by Dr. Jannifer Franklin with neurology   Patient Instructions  Will schedule high resolution CT chest and pulmonary function test  Follow up in 6 weeks    Chesley Mires, MD [place] Pager: [phone] 03/28/2019, 11:01 AM  Flow Sheet     Pulmonary tests:  PFT 10/30/12 >> FEV1 3.61 (109%), FEV1% 76, TLC 6.65 (96%), DLCO 72%  Chest imaging:  CT chest 11/07/12 >> calcification throughout pericardium  Cardiac tests:  Echo 05/05/18 >> EF 40 to 45%, mild AS  Review of Systems:  Constitutional: Negative.   HENT: Positive for hearing loss.   Eyes: Negative.   Respiratory: Positive for cough and shortness of breath.   Cardiovascular: Negative.   Gastrointestinal: Negative.   Endocrine: Negative.   Genitourinary: Negative.   Musculoskeletal: Positive for joint swelling.  Skin: Negative.   Allergic/Immunologic: Negative.   Neurological: Negative.   Hematological: Negative.   Psychiatric/Behavioral: Negative.    Medications:   Allergies as of 03/28/2019   No Known Allergies     Medication List       Accurate as of March 28, 2019 11:01 AM. If  you have any questions, ask your nurse or doctor.        STOP taking these medications   sertraline 50 MG tablet Commonly known as: ZOLOFT Stopped by: Chesley Mires, MD     TAKE these medications   amLODipine 5 MG tablet Commonly known as: NORVASC Take 1 tablet (5 mg total) by mouth daily.   aspirin 81 MG chewable tablet Chew 81 mg by mouth daily.   atorvastatin 40 MG tablet Commonly known as: LIPITOR Take 40 mg by mouth daily.   levETIRAcetam 500 MG tablet Commonly known as: KEPPRA Take 1 tablet by mouth 2 times daily.   Melatonin 5 MG Tabs Take by mouth.     omeprazole 40 MG capsule Commonly known as: PRILOSEC Take 40 mg by mouth daily.   traZODone 50 MG tablet Commonly known as: DESYREL Take 1 tablet by mouth as needed.       Past Surgical History:  He  has a past surgical history that includes Total knee arthroplasty (Left, 2011); Hernia repair (2011); Hand surgery (Right, 1983); Cataract extraction (2011); Tonsillectomy; and Total knee arthroplasty (Right, 04/14/2015).  Family History:  His family history includes Asthma in his mother; Heart disease in his father; Prostate cancer in his father.  Social History:  He  reports that he quit smoking about 49 years ago. His smoking use included cigarettes. He has a 45.00 pack-year smoking history. He has never used smokeless tobacco. He reports current alcohol use. He reports that he does not use drugs.

## 2019-03-28 NOTE — Patient Instructions (Signed)
Will schedule high resolution CT chest and pulmonary function test  Follow up in 6 weeks

## 2019-04-09 ENCOUNTER — Other Ambulatory Visit: Payer: Medicare Other

## 2019-04-09 ENCOUNTER — Other Ambulatory Visit: Payer: Self-pay

## 2019-04-09 ENCOUNTER — Ambulatory Visit
Admission: RE | Admit: 2019-04-09 | Discharge: 2019-04-09 | Disposition: A | Discharge status: Home / Self Care | Payer: Medicare Other | Source: Ambulatory Visit | Attending: Pulmonary Disease | Admitting: Pulmonary Disease

## 2019-04-09 DIAGNOSIS — Z87891 Personal history of nicotine dependence: Secondary | ICD-10-CM

## 2019-04-09 DIAGNOSIS — R05 Cough: Secondary | ICD-10-CM

## 2019-04-09 DIAGNOSIS — Z77090 Contact with and (suspected) exposure to asbestos: Secondary | ICD-10-CM

## 2019-04-09 DIAGNOSIS — R059 Cough, unspecified: Secondary | ICD-10-CM

## 2019-04-09 DIAGNOSIS — R0609 Other forms of dyspnea: Secondary | ICD-10-CM

## 2019-04-18 ENCOUNTER — Telehealth: Payer: Self-pay | Admitting: Pulmonary Disease

## 2019-04-18 NOTE — Telephone Encounter (Signed)
Called the patient to make him aware of the results. Patient voiced understanding. PFT scheduled for 2/12/221 and OV with Dr. Halford Chessman on 05/28/19 to be kept. Nothing further at this time.

## 2019-04-18 NOTE — Telephone Encounter (Signed)
HRCT chest 04/09/19 >> atherosclerosis, mild centrilobular and paraseptal emphysema, 3 mm nodule RML.   Please let him know his CT chest showed mild changes of emphysema.  Need to complete PFTs to determine if he might have COPD.  No evidence for asbestos related lung disease at this time.  Will discuss in more detail at next ROV.

## 2019-04-25 NOTE — Progress Notes (Signed)
PATIENT: Justin Sutton DOB: [birth year]  REASON FOR VISIT: follow up HISTORY FROM: patient  HISTORY OF PRESENT ILLNESS: Today 04/26/19  Justin Sutton is a 74 year old male with history of seizures.  He is well controlled on Keppra.  He is taking Keppra 500 mg twice a day.  He has not had recurrent seizure in about 3 years.  He is tolerating Keppra without side effect.  He drives a car without difficulty.  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  Otherwise, his health is good.  He uses a cane for walking, walking is at times unsteady, has had 2 knee replacements.  He sleeps well for him, goes to bed around 9 PM, wakes up about 3 AM, which is typical.  He is not drinking alcohol.  He presents today for follow-up unaccompanied.  He says he sees his primary doctor once a year for physical.  HISTORY 04/21/2018 Dr. Jannifer Franklin: Justin Sutton is a 74 year old right-handed white male with a history of seizures, he has been on Keppra and has been well controlled on 500 mg twice daily.  He has a history of alcohol abuse but he has not had any alcohol in 4 years.  The patient is back to driving a car, he is doing well with this.  He tolerates the Keppra well, he does have some underlying anxiety issues but he does not believe that the [place] has worsened this.  The patient is on Zoloft in low-dose, taking 50 mg daily.  He at times has some troubles with insomnia.  He returns for an evaluation.  No further seizures have been noted over the last year.  REVIEW OF SYSTEMS: Out of a complete 14 system review of symptoms, the patient complains only of the following symptoms, and all other reviewed systems are negative.  Seizure  ALLERGIES: No Known Allergies  HOME MEDICATIONS: Outpatient Medications Prior to Visit  Medication Sig Dispense Refill  . amLODipine (NORVASC) 5 MG tablet Take 1 tablet (5 mg total) by mouth daily. 30 tablet  0  . aspirin 81 MG chewable tablet Chew 81 mg by mouth daily.    Marland Kitchen atorvastatin (LIPITOR) 40 MG tablet Take 40 mg by mouth daily.    Marland Kitchen levETIRAcetam (KEPPRA) 500 MG tablet Take 1 tablet by mouth 2 times daily. 180 tablet 3  . Melatonin 5 MG TABS Take by mouth.    Marland Kitchen omeprazole (PRILOSEC) 40 MG capsule Take 40 mg by mouth daily.    . traZODone (DESYREL) 50 MG tablet Take 1 tablet by mouth as needed.     No facility-administered medications prior to visit.    PAST MEDICAL HISTORY: Past Medical History:  Diagnosis Date  . Arthritis   . Chronic cough   . Essential hypertension 02/14/2015  . GERD (gastroesophageal reflux disease)   . H/O asbestos exposure   . Hearing loss   . Heart murmur   . Hyperlipidemia   . Hypertension   . Nocturia   . Numbness    comes and goes per right hand   . OSA (obstructive sleep apnea)   . Thoracic aortic aneurysm without rupture ([place]) 02/14/2015   4.1 cm 01/2015    PAST SURGICAL HISTORY: Past Surgical History:  Procedure Laterality Date  . CATARACT EXTRACTION  2011   bilat   . HAND SURGERY Right 1983  . HERNIA REPAIR  2011  . TONSILLECTOMY    .  TOTAL KNEE ARTHROPLASTY Left 2011  . TOTAL KNEE ARTHROPLASTY Right 04/14/2015   Procedure: RIGHT TOTAL KNEE ARTHROPLASTY;  Surgeon: Gaynelle Arabian, MD;  Location: [facility];  Service: Orthopedics;  Laterality: Right;    FAMILY HISTORY: Family History  Problem Relation Age of Onset  . Asthma Mother   . Heart disease Father   . Prostate cancer Father     SOCIAL HISTORY: Social History   Socioeconomic History  . Marital status: Married    Spouse name: Not on file  . Number of children: 3  . Years of education: Not on file  . Highest education level: Not on file  Occupational History  . Occupation: Retired Therapist, art  Tobacco Use  . Smoking status: Former Smoker    Packs/day: 3.00    Years: 15.00    Pack years: 45.00    Types: Cigarettes    Quit date: 04/05/1970    Years since quitting: 49.0  .  Smokeless tobacco: Never Used  Substance and Sexual Activity  . Alcohol use: Yes    Comment: beer daily 2 per day   . Drug use: No  . Sexual activity: Not on file  Other Topics Concern  . Not on file  Social History Narrative   Epwort Sleepiness Scale = 1 (as of 02/14/15)   Social Determinants of Health   Financial Resource Strain:   . Difficulty of Paying Living Expenses: Not on file  Food Insecurity:   . Worried About Charity fundraiser in the Last Year: Not on file  . Ran Out of Food in the Last Year: Not on file  Transportation Needs:   . Lack of Transportation (Medical): Not on file  . Lack of Transportation (Non-Medical): Not on file  Physical Activity:   . Days of Exercise per Week: Not on file  . Minutes of Exercise per Session: Not on file  Stress:   . Feeling of Stress : Not on file  Social Connections:   . Frequency of Communication with Friends and Family: Not on file  . Frequency of Social Gatherings with Friends and Family: Not on file  . Attends Religious Services: Not on file  . Active Member of Clubs or Organizations: Not on file  . Attends Archivist Meetings: Not on file  . Marital Status: Not on file  Intimate Partner Violence:   . Fear of Current or Ex-Partner: Not on file  . Emotionally Abused: Not on file  . Physically Abused: Not on file  . Sexually Abused: Not on file   PHYSICAL EXAM  Vitals:   04/26/19 0743  BP: (!) 147/92  Pulse: 68  Temp: (!) 97 F (36.1 C)  Weight: 175 lb (79.4 kg)  Height: 5' 8.75" (1.746 m)   Body mass index is 26.03 kg/m.  Generalized: Well developed, in no acute distress   Neurological examination  Mentation: Alert oriented to time, place, history taking. Follows all commands speech and language fluent Cranial nerve II-XII: Pupils were equal round reactive to light. Extraocular movements were full, visual field were full on confrontational test. Facial sensation and strength were normal. . Head  turning and shoulder shrug  were normal and symmetric. Motor: The motor testing reveals 5 over 5 strength of all 4 extremities. Good symmetric motor tone is noted throughout.  Sensory: Sensory testing is intact to soft touch on all 4 extremities. No evidence of extinction is noted.  Coordination: Cerebellar testing reveals good finger-nose-finger and heel-to-shin bilaterally.  Gait and station:  Gait is normal, walks with a cane in the hallway. Tandem gait is unsteady.  Romberg is negative. No drift is seen.  Reflexes: Deep tendon reflexes are symmetric and normal bilaterally.   DIAGNOSTIC DATA (LABS, IMAGING, TESTING) - I reviewed patient records, labs, notes, testing and imaging myself where available.  Lab Results  Component Value Date   WBC 9.9 02/16/2017   HGB 14.0 02/16/2017   HCT 41.2 02/16/2017   MCV 89.6 02/16/2017   PLT 335 02/16/2017      Component Value Date/Time   NA 134 05/01/2018 0849   K 4.0 05/01/2018 0849   CL 99 05/01/2018 0849   CO2 19 (L) 05/01/2018 0849   GLUCOSE 116 (H) 05/01/2018 0849   GLUCOSE 140 (H) 02/16/2017 1237   BUN 8 05/01/2018 0849   CREATININE 0.80 05/01/2018 0849   CALCIUM 8.7 05/01/2018 0849   PROT 6.2 05/01/2018 0849   ALBUMIN 4.3 05/01/2018 0849   AST 29 05/01/2018 0849   ALT 24 05/01/2018 0849   ALKPHOS 67 05/01/2018 0849   BILITOT 0.3 05/01/2018 0849   GFRNONAA 89 05/01/2018 0849   GFRAA 103 05/01/2018 0849   Lab Results  Component Value Date   CHOL 133 05/01/2018   HDL 68 05/01/2018   LDLCALC 53 05/01/2018   TRIG 59 05/01/2018   CHOLHDL 2.0 05/01/2018   No results found for: HGBA1C No results found for: VITAMINB12 Lab Results  Component Value Date   TSH 2.087 06/04/2016   ASSESSMENT AND PLAN 74 y.o. year old male  has a past medical history of Arthritis, Chronic cough, Essential hypertension (02/14/2015), GERD (gastroesophageal reflux disease), H/O asbestos exposure, Hearing loss, Heart murmur, Hyperlipidemia, Hypertension,  Nocturia, Numbness, OSA (obstructive sleep apnea), and Thoracic aortic aneurysm without rupture ([place]) (02/14/2015). here with:  1.  History of seizures  He is doing well taking Keppra, and his seizures are well controlled.  He will remain on Keppra 500 mg twice a day.  I refilled medication for 1 year, he will call for recurrent seizure, otherwise follow-up in 1 year or sooner if needed.  I spent 15 minutes with the patient. 50% of this time was spent discussing his plan of care.   Butler Denmark, AGNP-C, DNP 04/26/2019, 7:46 AM [facility] [address], [place], [place] [address] [phone]

## 2019-04-26 ENCOUNTER — Ambulatory Visit (INDEPENDENT_AMBULATORY_CARE_PROVIDER_SITE_OTHER): Payer: Medicare Other | Admitting: Neurology

## 2019-04-26 ENCOUNTER — Encounter: Payer: Self-pay | Admitting: Neurology

## 2019-04-26 ENCOUNTER — Other Ambulatory Visit: Payer: Self-pay

## 2019-04-26 VITALS — BP 147/92 | HR 68 | Temp 97.0°F | Ht 68.75 in | Wt 175.0 lb

## 2019-04-26 DIAGNOSIS — R569 Unspecified convulsions: Secondary | ICD-10-CM | POA: Diagnosis not present

## 2019-04-26 MED ORDER — LEVETIRACETAM 500 MG PO TABS
ORAL_TABLET | ORAL | 3 refills | Status: DC
Start: 2019-04-26 — End: 2020-04-28

## 2019-04-26 NOTE — Patient Instructions (Signed)
Continue taking Keppra 500 mg twice daily, I will send in a refill today for 1 year. Call for recurrent seizure.

## 2019-04-26 NOTE — Progress Notes (Signed)
I have read the note, and I agree with the clinical assessment and plan.  Justin Sutton   

## 2019-05-15 ENCOUNTER — Other Ambulatory Visit (HOSPITAL_COMMUNITY)
Admission: RE | Admit: 2019-05-15 | Discharge: 2019-05-15 | Disposition: A | Discharge status: Home / Self Care | Payer: Medicare Other | Source: Ambulatory Visit | Attending: Pulmonary Disease | Admitting: Pulmonary Disease

## 2019-05-15 DIAGNOSIS — Z20822 Contact with and (suspected) exposure to covid-19: Secondary | ICD-10-CM | POA: Insufficient documentation

## 2019-05-15 DIAGNOSIS — Z01812 Encounter for preprocedural laboratory examination: Secondary | ICD-10-CM | POA: Insufficient documentation

## 2019-05-15 LAB — SARS CORONAVIRUS 2 (TAT 6-24 HRS): SARS Coronavirus 2: NEGATIVE

## 2019-05-17 ENCOUNTER — Ambulatory Visit: Payer: Medicare Other | Admitting: Pulmonary Disease

## 2019-05-18 ENCOUNTER — Ambulatory Visit (INDEPENDENT_AMBULATORY_CARE_PROVIDER_SITE_OTHER): Payer: Medicare Other | Admitting: Pulmonary Disease

## 2019-05-18 ENCOUNTER — Other Ambulatory Visit: Payer: Self-pay

## 2019-05-18 DIAGNOSIS — R0609 Other forms of dyspnea: Secondary | ICD-10-CM

## 2019-05-18 DIAGNOSIS — Z77090 Contact with and (suspected) exposure to asbestos: Secondary | ICD-10-CM

## 2019-05-18 DIAGNOSIS — R05 Cough: Secondary | ICD-10-CM

## 2019-05-18 DIAGNOSIS — Z87891 Personal history of nicotine dependence: Secondary | ICD-10-CM

## 2019-05-18 DIAGNOSIS — R059 Cough, unspecified: Secondary | ICD-10-CM

## 2019-05-18 DIAGNOSIS — R0600 Dyspnea, unspecified: Secondary | ICD-10-CM

## 2019-05-18 LAB — PULMONARY FUNCTION TEST
DL/VA % pred: 69 %
DL/VA: 2.78 ml/min/mmHg/L
DLCO unc % pred: 78 %
DLCO unc: 19.55 ml/min/mmHg
FEF 25-75 Post: 1.93 L/sec
FEF 25-75 Pre: 1.73 L/sec
FEF2575-%Change-Post: 11 %
FEF2575-%Pred-Post: 86 %
FEF2575-%Pred-Pre: 77 %
FEV1-%Change-Post: 5 %
FEV1-%Pred-Post: 102 %
FEV1-%Pred-Pre: 96 %
FEV1-Post: 3.1 L
FEV1-Pre: 2.93 L
FEV1FVC-%Change-Post: 2 %
FEV1FVC-%Pred-Pre: 90 %
FEV6-%Change-Post: 3 %
FEV6-%Pred-Post: 113 %
FEV6-%Pred-Pre: 109 %
FEV6-Post: 4.46 L
FEV6-Pre: 4.31 L
FEV6FVC-%Change-Post: 0 %
FEV6FVC-%Pred-Post: 103 %
FEV6FVC-%Pred-Pre: 103 %
FVC-%Change-Post: 2 %
FVC-%Pred-Post: 109 %
FVC-%Pred-Pre: 106 %
FVC-Post: 4.58 L
FVC-Pre: 4.45 L
Post FEV1/FVC ratio: 68 %
Post FEV6/FVC ratio: 97 %
Pre FEV1/FVC ratio: 66 %
Pre FEV6/FVC Ratio: 97 %

## 2019-05-18 NOTE — Progress Notes (Signed)
PFT done today. 

## 2019-05-19 ENCOUNTER — Ambulatory Visit: Payer: Medicare Other | Attending: Internal Medicine

## 2019-05-19 DIAGNOSIS — Z23 Encounter for immunization: Secondary | ICD-10-CM | POA: Insufficient documentation

## 2019-05-19 NOTE — Progress Notes (Signed)
   Covid-19 Vaccination Clinic  Name:  Justin Sutton    MRN: [id number] DOB: 05-25-1945  05/19/2019  Mr. Verhoeven was observed post Covid-19 immunization for 15 minutes without incidence. He was provided with Vaccine Information Sheet and instruction to access the V-Safe system.   Mr. Montreuil was instructed to call 911 with any severe reactions post vaccine: Marland Kitchen Difficulty breathing  . Swelling of your face and throat  . A fast heartbeat  . A bad rash all over your body  . Dizziness and weakness    Immunizations Administered    Name Date Dose VIS Date Route   Pfizer COVID-19 Vaccine 05/19/2019  8:45 AM 0.3 mL 03/16/2019 Intramuscular   Manufacturer: [place]   Lot: [id number]   [place]: [id number]

## 2019-05-28 ENCOUNTER — Ambulatory Visit: Payer: Medicare Other | Admitting: Pulmonary Disease

## 2019-05-30 ENCOUNTER — Ambulatory Visit (INDEPENDENT_AMBULATORY_CARE_PROVIDER_SITE_OTHER): Payer: Medicare Other | Admitting: Pulmonary Disease

## 2019-05-30 ENCOUNTER — Other Ambulatory Visit: Payer: Self-pay

## 2019-05-30 ENCOUNTER — Encounter: Payer: Self-pay | Admitting: Pulmonary Disease

## 2019-05-30 VITALS — BP 144/78 | HR 77 | Temp 98.3°F | Ht 68.75 in | Wt 171.2 lb

## 2019-05-30 DIAGNOSIS — J432 Centrilobular emphysema: Secondary | ICD-10-CM | POA: Diagnosis not present

## 2019-05-30 MED ORDER — INCRUSE ELLIPTA 62.5 MCG/INH IN AEPB: 1.0000 | INHALATION_SPRAY | Freq: Every day | RESPIRATORY_TRACT | 5 refills | Status: DC

## 2019-05-30 MED ORDER — ALBUTEROL SULFATE HFA 108 (90 BASE) MCG/ACT IN AERS: 2.0000 | INHALATION_SPRAY | Freq: Four times a day (QID) | RESPIRATORY_TRACT | 3 refills | Status: DC | PRN

## 2019-05-30 NOTE — Patient Instructions (Signed)
Incruse 1 puff daily  Albuterol two puffs every 6 hours as needed for cough, wheeze, chest congestion or shortness of breath  Will print out copies of your recent breathing test and CT chest  Follow up in 8 weeks

## 2019-05-30 NOTE — Progress Notes (Signed)
[facility]  Chief Complaint  Patient presents with  . Follow-up    DOE (dyspnea on exertion)    Constitutional:  BP (!) 144/78 (BP Location: Left Arm, Patient Position: Sitting, Cuff Size: Normal)   Pulse 77   Temp 98.3 F (36.8 C)   Ht 5' 8.75" (1.746 m)   Wt 171 lb 3.2 oz (77.7 kg)   SpO2 97% Comment: on room air  BMI 25.47 kg/m   Past Medical History:  HTN, GERD, HLD, OSA, Seizures  Brief Summary:  Justin Sutton is a 74 y.o. male former smoker with COPD/emphysema.    He had CT chest.  Showed mild emphysema.  No evidence for asbestos related lung disease.  PFT showed mild changes consistent with COPD from emphysema.  He continues to have cough and get winded.  Physical Exam:   Appearance - well kempt   ENMT - no sinus tenderness, no nasal discharge, no oral exudate  Neck - no masses, trachea midline, no thyromegaly, no elevation in JVP  Respiratory - normal appearance of chest wall, normal respiratory effort w/o accessory muscle use, no dullness on percussion, no wheezing or rales  CV - s1s2 regular rate and rhythm, 2/6 systolic murmur, no peripheral edema, radial pulses symmetric  Lymph - no adenopathy noted in neck and axillary areas  Ext - no cyanosis, clubbing, or joint inflammation noted  Psych - normal mood and affect   Assessment/Plan:   COPD with emphysema. - reviewed his CT chest and PFT with him, and print outs provided - will start him on incruse with prn albuterol - demonstrate inhaler technique - will need to discuss pneumonia vaccine at some point >> he received first dose of COVID 19 vaccine on 05/19/19  CAD, HLD, HTN, mild Aortic Stenosis. - followed by Dr. Oval Linsey with cardiology  Seizure disorder. - followed by Dr. Jannifer Franklin with neurology   Patient Instructions  Incruse 1 puff daily  Albuterol two puffs every 6 hours as needed for cough, wheeze, chest congestion or shortness of  breath  Will print out copies of your recent breathing test and CT chest  Follow up in 8 weeks  Time spent 33 minutes  Chesley Mires, MD [place] Pager: [phone] 05/30/2019, 12:01 PM  Flow Sheet     Pulmonary tests:  PFT 10/30/12 >> FEV1 3.61 (109%), FEV1% 76, TLC 6.65 (96%), DLCO 72% PFT 05/18/19 >> FEV1 3.10 (102%), FEV1% 68, TLC 6.06 (87%), DLCO 78%  Chest imaging:  CT chest 11/07/12 >> calcification throughout pericardium HRCT chest 04/09/19 >> atherosclerosis, mild centrilobular and paraseptal emphysema, benign 3 mm nodule RML  Cardiac tests:  Echo 05/05/18 >> EF 40 to 45%, mild AS   Medications:   Allergies as of 05/30/2019   No Known Allergies     Medication List       Accurate as of May 30, 2019 12:01 PM. If you have any questions, ask your nurse or doctor.        amLODipine 5 MG tablet Commonly known as: NORVASC Take 1 tablet (5 mg total) by mouth daily.   aspirin 81 MG chewable tablet Chew 81 mg by mouth daily.   atorvastatin 40 MG tablet Commonly known as: LIPITOR Take 40 mg by mouth daily.   levETIRAcetam 500 MG tablet Commonly known as: KEPPRA Take 1 tablet by mouth 2 times daily.   Melatonin 5 MG Tabs Take by mouth.   omeprazole 40 MG capsule Commonly known as:  PRILOSEC Take 40 mg by mouth daily.   traZODone 50 MG tablet Commonly known as: DESYREL Take 1 tablet by mouth as needed.       Past Surgical History:  He  has a past surgical history that includes Total knee arthroplasty (Left, 2011); Hernia repair (2011); Hand surgery (Right, 1983); Cataract extraction (2011); Tonsillectomy; and Total knee arthroplasty (Right, 04/14/2015).  Family History:  His family history includes Asthma in his mother; Heart disease in his father; Prostate cancer in his father.  Social History:  He  reports that he quit smoking about 49 years ago. His smoking use included cigarettes. He has a 45.00 pack-year smoking history.  He has never used smokeless tobacco. He reports current alcohol use. He reports that he does not use drugs.

## 2019-06-10 ENCOUNTER — Ambulatory Visit: Payer: Medicare Other | Attending: Internal Medicine

## 2019-06-10 DIAGNOSIS — Z23 Encounter for immunization: Secondary | ICD-10-CM | POA: Insufficient documentation

## 2019-06-10 NOTE — Progress Notes (Signed)
   Covid-19 Vaccination Clinic  Name:  MAKENA PRAIRIE    MRN: [id number] DOB: 1945/05/14  06/10/2019  Mr. Kellas was observed post Covid-19 immunization for 15 minutes without incident. He was provided with Vaccine Information Sheet and instruction to access the V-Safe system.   Mr. Hoh was instructed to call 911 with any severe reactions post vaccine: Marland Kitchen Difficulty breathing  . Swelling of face and throat  . A fast heartbeat  . A bad rash all over body  . Dizziness and weakness   Immunizations Administered    Name Date Dose VIS Date Route   Pfizer COVID-19 Vaccine 06/10/2019 11:27 AM 0.3 mL 03/16/2019 Intramuscular   Manufacturer: [place]   Lot: [id number]   [place]: [id number]

## 2019-06-12 DIAGNOSIS — Z1211 Encounter for screening for malignant neoplasm of colon: Secondary | ICD-10-CM | POA: Diagnosis not present

## 2019-06-12 DIAGNOSIS — Z86010 Personal history of colonic polyps: Secondary | ICD-10-CM | POA: Diagnosis not present

## 2019-06-12 DIAGNOSIS — R198 Other specified symptoms and signs involving the digestive system and abdomen: Secondary | ICD-10-CM | POA: Diagnosis not present

## 2019-06-22 ENCOUNTER — Other Ambulatory Visit: Payer: Self-pay

## 2019-06-22 ENCOUNTER — Ambulatory Visit (HOSPITAL_COMMUNITY): Payer: Medicare Other | Attending: Internal Medicine

## 2019-06-22 DIAGNOSIS — I7121 Aneurysm of the ascending aorta, without rupture: Secondary | ICD-10-CM

## 2019-06-22 DIAGNOSIS — I712 Thoracic aortic aneurysm, without rupture: Secondary | ICD-10-CM

## 2019-06-22 DIAGNOSIS — I350 Nonrheumatic aortic (valve) stenosis: Secondary | ICD-10-CM | POA: Diagnosis not present

## 2019-06-26 ENCOUNTER — Other Ambulatory Visit: Payer: Self-pay

## 2019-06-26 ENCOUNTER — Encounter: Payer: Self-pay | Admitting: Cardiovascular Disease

## 2019-06-26 ENCOUNTER — Ambulatory Visit (INDEPENDENT_AMBULATORY_CARE_PROVIDER_SITE_OTHER): Payer: Medicare Other | Admitting: Cardiovascular Disease

## 2019-06-26 VITALS — BP 126/70 | HR 61 | Ht 71.0 in | Wt 176.0 lb

## 2019-06-26 DIAGNOSIS — I712 Thoracic aortic aneurysm, without rupture, unspecified: Secondary | ICD-10-CM

## 2019-06-26 DIAGNOSIS — I10 Essential (primary) hypertension: Secondary | ICD-10-CM

## 2019-06-26 DIAGNOSIS — I2584 Coronary atherosclerosis due to calcified coronary lesion: Secondary | ICD-10-CM

## 2019-06-26 DIAGNOSIS — I350 Nonrheumatic aortic (valve) stenosis: Secondary | ICD-10-CM | POA: Diagnosis not present

## 2019-06-26 DIAGNOSIS — I2510 Atherosclerotic heart disease of native coronary artery without angina pectoris: Secondary | ICD-10-CM

## 2019-06-26 DIAGNOSIS — Z1212 Encounter for screening for malignant neoplasm of rectum: Secondary | ICD-10-CM | POA: Diagnosis not present

## 2019-06-26 DIAGNOSIS — Z1211 Encounter for screening for malignant neoplasm of colon: Secondary | ICD-10-CM | POA: Diagnosis not present

## 2019-06-26 NOTE — Progress Notes (Signed)
Cardiology Office Note   Date:  06/26/2019   ID:  Justin Sutton, DOB Oct 23, 1945, MRN [id number]  PCP:  Lujean Amel, MD  Cardiologist:   Skeet Latch, MD   No chief complaint on file.    History of Present Illness: Justin Sutton is a 74 y.o. male with aortic stenosis, aortic aneurysm, coronary calcification, vasovagal syncope, hypertension, hyperlipidemia, seizures, peripheral arterial disease, prior EtOH abuse, and asbestos exposure here to re-establsih care.  He was initially seen in 2016 for presurgical risk assessment. Justin Sutton saw his PCP, Dr. Kinnie Feil and surgical clearance for a knee surgery.  He was incidentally noted to have coronary calcification on noncontrast head CT.  Echo 01/2015 showed mild aortic stenosis with a mean gradient of 12 mmHg.  it also showed a mild ascending aortic aneurysm.  At the time he was exercising regularly and had no symptoms.  He was felt to be low risk for surgery.    Since his last appointment Justin Sutton has been doing well.  He continues to walk regularly.  He has no exertional chest pain but does have some shortness of breath.  He saw Dr. Halford Chessman 05/2019 and was prescribed Incruse.  However he was unable to fill the prescription due to cost.  He thinks that he may be able to get it at the [facility] for 8 hours.  No lower extremity edema, orthopnea, or PND.  He has been able to 6 keep his weight down by limiting his food intake and avoiding meat.  His blood pressure at home has been consistently around 120/80.  He is otherwise without complaint at this time.  Past Medical History:  Diagnosis Date  . Arthritis   . Chronic cough   . Essential hypertension 02/14/2015  . GERD (gastroesophageal reflux disease)   . H/O asbestos exposure   . Hearing loss   . Heart murmur   . Hyperlipidemia   . Hypertension   . Nocturia   . Numbness    comes and goes per right hand   . OSA (obstructive sleep apnea)   . Thoracic aortic aneurysm  without rupture ([place]) 02/14/2015   4.1 cm 01/2015    Past Surgical History:  Procedure Laterality Date  . CATARACT EXTRACTION  2011   bilat   . HAND SURGERY Right 1983  . HERNIA REPAIR  2011  . TONSILLECTOMY    . TOTAL KNEE ARTHROPLASTY Left 2011  . TOTAL KNEE ARTHROPLASTY Right 04/14/2015   Procedure: RIGHT TOTAL KNEE ARTHROPLASTY;  Surgeon: Gaynelle Arabian, MD;  Location: [facility];  Service: Orthopedics;  Laterality: Right;     Current Outpatient Medications  Medication Sig Dispense Refill  . albuterol (VENTOLIN HFA) 108 (90 Base) MCG/ACT inhaler Inhale 2 puffs into the lungs every 6 (six) hours as needed for wheezing or shortness of breath. 6.7 g 3  . amLODipine (NORVASC) 5 MG tablet Take 1 tablet (5 mg total) by mouth daily. 30 tablet 0  . aspirin 81 MG chewable tablet Chew 81 mg by mouth daily.    Marland Kitchen atorvastatin (LIPITOR) 40 MG tablet Take 40 mg by mouth daily.    Marland Kitchen levETIRAcetam (KEPPRA) 500 MG tablet Take 1 tablet by mouth 2 times daily. 180 tablet 3  . Melatonin 5 MG TABS Take by mouth.    Marland Kitchen omeprazole (PRILOSEC) 40 MG capsule Take 40 mg by mouth daily.    . traZODone (DESYREL) 50 MG tablet Take 1 tablet by mouth as needed.    Marland Kitchen  umeclidinium bromide (INCRUSE ELLIPTA) 62.5 MCG/INH AEPB Inhale 1 puff into the lungs daily. 1 each 5   No current facility-administered medications for this visit.    Allergies:   Patient has no known allergies.    Social History:  The patient  reports that he quit smoking about 49 years ago. His smoking use included cigarettes. He has a 45.00 pack-year smoking history. He has never used smokeless tobacco. He reports current alcohol use. He reports that he does not use drugs.   Family History:  The patient's family history includes Asthma in his mother; Heart disease in his father; Prostate cancer in his father.    ROS:  Please see the history of present illness.   Otherwise, review of systems are positive for none.   All other systems are reviewed  and negative.    PHYSICAL EXAM: VS:  BP 126/70   Pulse 61   Ht 5\' 11"  (1.803 m)   Wt 176 lb (79.8 kg)   SpO2 95%   BMI 24.55 kg/m  , BMI Body mass index is 24.55 kg/m. GENERAL:  Well appearing HEENT: Pupils equal round and reactive, fundi not visualized, oral mucosa unremarkable NECK:  No jugular venous distention, waveform within normal limits, carotid upstroke brisk and symmetric, no bruits LUNGS:  Clear to auscultation bilaterally HEART:  RRR.  PMI not displaced or sustained,S1 and S2 within normal limits, no S3, no S4, no clicks, no rubs, III/VI mid-peaking systolic murmur at the LUSB ABD:  Flat, positive bowel sounds normal in frequency in pitch, no bruits, no rebound, no guarding, no midline pulsatile mass, no hepatomegaly, no splenomegaly EXT:  2 plus pulses throughout, no edema, no cyanosis no clubbing SKIN:  No rashes no nodules NEURO:  Cranial nerves II through XII grossly intact, motor grossly intact throughout PSYCH:  Cognitively intact, oriented to person place and time   EKG:  EKG is ordered today. 05/01/18: Sinus rhythm.  Rate 61 bpm.   06/26/2018: Sinus rhythm.  Rate 61 bpm.  First-degree AV block.   Echo 01/27/15: Study Conclusions  - Left ventricle: The cavity size was normal. Wall thickness was increased in a pattern of mild LVH. Systolic function was normal. The estimated ejection fraction was in the range of 55% to 60%. Wall motion was normal; there were no regional wall motion abnormalities. Doppler parameters are consistent with abnormal left ventricular relaxation (grade 1 diastolic dysfunction). The E/e&' ratio is between 8-15, suggesting indeterminate LV filling pressure. - Aortic valve: Trileaflet; mildly calcified leaflets. Mild stenosis. There was trivial regurgitation. Mean gradient (S): 12 mm Hg. Peak gradient (S): 30 mm Hg. Valve area (VTI): 1.78 cm^2. Valve area (Vmean): 1.76 cm^2. - Aorta: Aortic root dimension: 41 mm  (ED). - Ascending aorta: The ascending aorta was mildly dilated. - Mitral valve: Calcified annulus. There was trivial regurgitation. - Left atrium: The atrium was normal in size. - Right atrium: The atrium was mildly dilated. - Atrial septum: Mobile IAS. PFO cannot be excluded. - Tricuspid valve: There was mild regurgitation. - Pulmonary arteries: PA peak pressure: 30 mm Hg (S). - Inferior vena cava: The vessel was normal in size. The respirophasic diameter changes were in the normal range (= 50%), consistent with normal central venous pressure.  Impressions:  - LVEF 55-60%, mild LVH, normal wall motion, diastolic dysfunction, indeterminate LV filling pressure, mild aortic valve stenosis (AVA 1.7-1.8 cm2) with trivial AI, dilated aortic root and ascending aorta to 4.1 cm, mild RAE, mobile IAS - PFO cannot  be excluded, mild TR, RVSP 30 mmHg.   Recent Labs: No results found for requested labs within last 8760 hours.   01/20/15: WBC 7.3, Hgb 13.9, Plt 248 Na 137, K 4.6, BUN 13, Creatinine 0.77 AST 19, ALT 19  10/05/2017: Total cholesterol 245, triglycerides 122, HDL 65, LDL 156  02/16/2019: Total cholesterol 188, triglycerides 74, HDL 86, LDL 88  BUN 14, creatinine 0.88  Lipid Panel    Component Value Date/Time   CHOL 133 05/01/2018 0849   TRIG 59 05/01/2018 0849   HDL 68 05/01/2018 0849   CHOLHDL 2.0 05/01/2018 0849   LDLCALC 53 05/01/2018 0849      Wt Readings from Last 3 Encounters:  06/26/19 176 lb (79.8 kg)  05/30/19 171 lb 3.2 oz (77.7 kg)  04/26/19 175 lb (79.4 kg)      ASSESSMENT AND PLAN:  # Hypertension: BP remains well have been controlled on amlodipine.  # Coronary calcification: # Hyperlipidemia:  Coronary calcification previously noted on CT scan.  He continues to walk regularly and has no exertional symptoms.  Continue aspirin and atorvastatin.  # Aortic stenosis: Mean gradient 13 mmHg on echo 06/2019.  Unchanged from prior.  #:  Ascending aortic aneurysm: Moderate ascending thoracic aortic aneurysm.  Repeat echo as above.  Ascending aorta 4.5 cm.  Repeat echo in one year.  # Alcohol abuse: No EtOH in years.   Current medicines are reviewed at length with the patient today.  The patient does not have concerns regarding medicines.  The following changes have been made:  no change  Labs/ tests ordered today include:   No orders of the defined types were placed in this encounter.    Disposition:   FU with Keilynn Marano C. Oval Linsey, MD in 1 year.   Signed, Skeet Latch, MD  06/26/2019 8:25 AM    [place]

## 2019-06-26 NOTE — Patient Instructions (Signed)
Medication Instructions:  Your physician recommends that you continue on your current medications as directed. Please refer to the Current Medication list given to you today.  *If you need a refill on your cardiac medications before your next appointment, please call your pharmacy*  Lab Work: NONE   Testing/Procedures: NONE  Follow-Up: At [facility], you and your health needs are our priority.  As part of our continuing mission to provide you with exceptional heart care, we have created designated Provider Care Teams.  These Care Teams include your primary Cardiologist (physician) and Advanced Practice Providers (APPs -  Physician Assistants and Nurse Practitioners) who all work together to provide you with the care you need, when you need it.  We recommend signing up for the patient portal called "MyChart".  Sign up information is provided on this After Visit Summary.  MyChart is used to connect with patients for Virtual Visits (Telemedicine).  Patients are able to view lab/test results, encounter notes, upcoming appointments, etc.  Non-urgent messages can be sent to your provider as well.   To learn more about what you can do with MyChart, go to [email or web address].    Your next appointment:   12 month(s) You will receive a reminder letter in the mail two months in advance. If you don't receive a letter, please call our office to schedule the follow-up appointment.  The format for your next appointment:   Either In Person or Virtual  Provider:   You may see DR [facility]  or one of the following Advanced Practice Providers on your designated Care Team:    Kerin Ransom, PA-C  [place], [place]  Coletta Memos, [place]

## 2019-06-30 LAB — COLOGUARD: COLOGUARD: NEGATIVE

## 2019-07-25 ENCOUNTER — Ambulatory Visit: Payer: Medicare Other | Admitting: Pulmonary Disease

## 2020-03-11 DIAGNOSIS — I10 Essential (primary) hypertension: Secondary | ICD-10-CM | POA: Diagnosis not present

## 2020-03-11 DIAGNOSIS — R569 Unspecified convulsions: Secondary | ICD-10-CM | POA: Diagnosis not present

## 2020-03-11 DIAGNOSIS — F321 Major depressive disorder, single episode, moderate: Secondary | ICD-10-CM | POA: Diagnosis not present

## 2020-03-11 DIAGNOSIS — Z0001 Encounter for general adult medical examination with abnormal findings: Secondary | ICD-10-CM | POA: Diagnosis not present

## 2020-03-11 DIAGNOSIS — I712 Thoracic aortic aneurysm, without rupture: Secondary | ICD-10-CM | POA: Diagnosis not present

## 2020-03-11 DIAGNOSIS — Z79899 Other long term (current) drug therapy: Secondary | ICD-10-CM | POA: Diagnosis not present

## 2020-03-11 DIAGNOSIS — E7800 Pure hypercholesterolemia, unspecified: Secondary | ICD-10-CM | POA: Diagnosis not present

## 2020-03-11 DIAGNOSIS — I700 Atherosclerosis of aorta: Secondary | ICD-10-CM | POA: Diagnosis not present

## 2020-04-28 ENCOUNTER — Encounter: Payer: Self-pay | Admitting: Neurology

## 2020-04-28 ENCOUNTER — Ambulatory Visit (INDEPENDENT_AMBULATORY_CARE_PROVIDER_SITE_OTHER): Payer: Medicare Other | Admitting: Neurology

## 2020-04-28 VITALS — BP 135/84 | HR 75 | Ht 71.0 in | Wt 164.0 lb

## 2020-04-28 DIAGNOSIS — R569 Unspecified convulsions: Secondary | ICD-10-CM

## 2020-04-28 MED ORDER — LEVETIRACETAM 500 MG PO TABS: ORAL_TABLET | ORAL | 3 refills | Status: DC

## 2020-04-28 NOTE — Progress Notes (Signed)
I have read the note, and I agree with the clinical assessment and plan.  Bandon Sherwin K Cyle Kenyon   

## 2020-04-28 NOTE — Patient Instructions (Signed)
Continue keppra at current dosing Call for seizure activity  See you back in 1 year

## 2020-04-28 NOTE — Progress Notes (Signed)
PATIENT: Justin Sutton DOB: 1945/06/18  REASON FOR VISIT: follow up HISTORY FROM: patient  HISTORY OF PRESENT ILLNESS: Today 04/28/20  Justin Sutton is a 75 year old male with history of seizures.  He is well controlled on Keppra 500 mg twice daily.  Last seizure was in 2017 or 2018.  Tolerates medication well, denies side effect of irritability or moodiness.  No longer drinks alcohol.  He lives with his wife.  He uses a cane for walking stability.  Walking is his main form of exercise, 3 to 4 miles daily.  He used to be a Actor, now enjoys being a Production designer, theatre/television/film and doing projects.  On aspirin 81 mg daily.  Recently had physical, reports labs were unremarkable.  No new problems or concerns.  Here today for follow-up unaccompanied.  HISTORY 04/26/2019 SS: Justin Sutton is a 75 year old male with history of seizures.  He is well controlled on Keppra.  He is taking Keppra 500 mg twice a day.  He has not had recurrent seizure in about 3 years.  He is tolerating Keppra without side effect.  He drives a car without difficulty.  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  Otherwise, his health is good.  He uses a cane for walking, walking is at times unsteady, has had 2 knee replacements.  He sleeps well for him, goes to bed around 9 PM, wakes up about 3 AM, which is typical.  He is not drinking alcohol.  He presents today for follow-up unaccompanied.  He says he sees his primary doctor once a year for physical.   REVIEW OF SYSTEMS: Out of a complete 14 system review of symptoms, the patient complains only of the following symptoms, and all other reviewed systems are negative.  N/a  ALLERGIES: No Known Allergies  HOME MEDICATIONS: Outpatient Medications Prior to Visit  Medication Sig Dispense Refill  . albuterol (VENTOLIN HFA) 108 (90 Base) MCG/ACT inhaler Inhale 2 puffs into the lungs every 6 (six) hours as  needed for wheezing or shortness of breath. 6.7 g 3  . amLODipine (NORVASC) 5 MG tablet Take 1 tablet (5 mg total) by mouth daily. 30 tablet 0  . aspirin 81 MG chewable tablet Chew 81 mg by mouth daily.    Marland Kitchen atorvastatin (LIPITOR) 40 MG tablet Take 40 mg by mouth daily.    . Melatonin 5 MG TABS Take by mouth.    . traZODone (DESYREL) 50 MG tablet Take 1 tablet by mouth as needed.    . levETIRAcetam (KEPPRA) 500 MG tablet Take 1 tablet by mouth 2 times daily. 180 tablet 3  . omeprazole (PRILOSEC) 40 MG capsule Take 40 mg by mouth daily.    Marland Kitchen umeclidinium bromide (INCRUSE ELLIPTA) 62.5 MCG/INH AEPB Inhale 1 puff into the lungs daily. 1 each 5   No facility-administered medications prior to visit.    PAST MEDICAL HISTORY: Past Medical History:  Diagnosis Date  . Arthritis   . Chronic cough   . Essential hypertension 02/14/2015  . GERD (gastroesophageal reflux disease)   . H/O asbestos exposure   . Hearing loss   . Heart murmur   . Hyperlipidemia   . Hypertension   . Nocturia   . Numbness    comes and goes per right hand   . OSA (obstructive sleep apnea)   . Thoracic aortic aneurysm without rupture ([place]) 02/14/2015   4.1 cm  01/2015    PAST SURGICAL HISTORY: Past Surgical History:  Procedure Laterality Date  . CATARACT EXTRACTION  2011   bilat   . HAND SURGERY Right 1983  . HERNIA REPAIR  2011  . TONSILLECTOMY    . TOTAL KNEE ARTHROPLASTY Left 2011  . TOTAL KNEE ARTHROPLASTY Right 04/14/2015   Procedure: RIGHT TOTAL KNEE ARTHROPLASTY;  Surgeon: Gaynelle Arabian, MD;  Location: [facility];  Service: Orthopedics;  Laterality: Right;    FAMILY HISTORY: Family History  Problem Relation Age of Onset  . Asthma Mother   . Heart disease Father   . Prostate cancer Father     SOCIAL HISTORY: Social History   Socioeconomic History  . Marital status: Married    Spouse name: Not on file  . Number of children: 3  . Years of education: Not on file  . Highest education level: Not on  file  Occupational History  . Occupation: Retired Therapist, art  Tobacco Use  . Smoking status: Former Smoker    Packs/day: 3.00    Years: 15.00    Pack years: 45.00    Types: Cigarettes    Quit date: 04/05/1970    Years since quitting: 50.0  . Smokeless tobacco: Never Used  Substance and Sexual Activity  . Alcohol use: Yes    Comment: beer daily 2 per day   . Drug use: No  . Sexual activity: Not on file  Other Topics Concern  . Not on file  Social History Narrative   Epwort Sleepiness Scale = 1 (as of 02/14/15)   Social Determinants of Health   Financial Resource Strain: Not on file  Food Insecurity: Not on file  Transportation Needs: Not on file  Physical Activity: Not on file  Stress: Not on file  Social Connections: Not on file  Intimate Partner Violence: Not on file   PHYSICAL EXAM  Vitals:   04/28/20 0739  BP: 135/84  Pulse: 75  Weight: 164 lb (74.4 kg)  Height: 5\' 11"  (1.803 m)   Body mass index is 22.87 kg/m.  Generalized: Well developed, in no acute distress   Neurological examination  Mentation: Alert oriented to time, place, history taking. Follows all commands speech and language fluent Cranial nerve II-XII: Pupils were equal round reactive to light. Extraocular movements were full, visual field were full on confrontational test. Facial sensation and strength were normal. Head turning and shoulder shrug were normal and symmetric. Motor: The motor testing reveals 5 over 5 strength of all 4 extremities. Good symmetric motor tone is noted throughout.  Sensory: Sensory testing is intact to soft touch on all 4 extremities. No evidence of extinction is noted.  Coordination: Cerebellar testing reveals good finger-nose-finger and heel-to-shin bilaterally.  Gait and station: Gait is slightly wide-based, but steady, uses walking stick. Reflexes: Deep tendon reflexes are symmetric and normal bilaterally.   DIAGNOSTIC DATA (LABS, IMAGING, TESTING) - I reviewed patient  records, labs, notes, testing and imaging myself where available.  Lab Results  Component Value Date   WBC 9.9 02/16/2017   HGB 14.0 02/16/2017   HCT 41.2 02/16/2017   MCV 89.6 02/16/2017   PLT 335 02/16/2017      Component Value Date/Time   NA 134 05/01/2018 0849   K 4.0 05/01/2018 0849   CL 99 05/01/2018 0849   CO2 19 (L) 05/01/2018 0849   GLUCOSE 116 (H) 05/01/2018 0849   GLUCOSE 140 (H) 02/16/2017 1237   BUN 8 05/01/2018 0849   CREATININE 0.80 05/01/2018 0849  CALCIUM 8.7 05/01/2018 0849   PROT 6.2 05/01/2018 0849   ALBUMIN 4.3 05/01/2018 0849   AST 29 05/01/2018 0849   ALT 24 05/01/2018 0849   ALKPHOS 67 05/01/2018 0849   BILITOT 0.3 05/01/2018 0849   GFRNONAA 89 05/01/2018 0849   GFRAA 103 05/01/2018 0849   Lab Results  Component Value Date   CHOL 133 05/01/2018   HDL 68 05/01/2018   LDLCALC 53 05/01/2018   TRIG 59 05/01/2018   CHOLHDL 2.0 05/01/2018   No results found for: HGBA1C No results found for: VITAMINB12 Lab Results  Component Value Date   TSH 2.087 06/04/2016      ASSESSMENT AND PLAN 75 y.o. year old male  has a past medical history of Arthritis, Chronic cough, Essential hypertension (02/14/2015), GERD (gastroesophageal reflux disease), H/O asbestos exposure, Hearing loss, Heart murmur, Hyperlipidemia, Hypertension, Nocturia, Numbness, OSA (obstructive sleep apnea), and Thoracic aortic aneurysm without rupture ([place]) (02/14/2015). here with:  1.  History of seizures  He continues to do well on Keppra, his seizures are well controlled.  He will continue Keppra 500 mg twice daily.  He will call for recurrent seizure, otherwise follow-up 1 year or sooner if needed.  I spent 20 minutes of face-to-face and non-face-to-face time with patient.  This included previsit chart review, lab review, study review, order entry, electronic health record documentation, patient education.  Butler Denmark, AGNP-C, DNP 04/28/2020, 8:04 AM [facility] [address], [place], [place] [address] [phone]

## 2020-07-15 ENCOUNTER — Encounter: Payer: Self-pay | Admitting: Cardiovascular Disease

## 2020-07-29 ENCOUNTER — Ambulatory Visit: Payer: Medicare Other | Admitting: Cardiovascular Disease

## 2020-08-12 ENCOUNTER — Encounter: Payer: Self-pay | Admitting: Cardiovascular Disease

## 2020-10-16 ENCOUNTER — Ambulatory Visit (HOSPITAL_BASED_OUTPATIENT_CLINIC_OR_DEPARTMENT_OTHER): Payer: Medicare Other | Admitting: Cardiovascular Disease

## 2020-11-28 ENCOUNTER — Encounter (HOSPITAL_BASED_OUTPATIENT_CLINIC_OR_DEPARTMENT_OTHER): Payer: Self-pay | Admitting: Cardiovascular Disease

## 2020-11-28 ENCOUNTER — Ambulatory Visit (INDEPENDENT_AMBULATORY_CARE_PROVIDER_SITE_OTHER): Payer: Medicare Other | Admitting: Cardiovascular Disease

## 2020-11-28 ENCOUNTER — Other Ambulatory Visit: Payer: Self-pay

## 2020-11-28 VITALS — BP 130/80 | HR 69 | Resp 20 | Ht 70.0 in | Wt 155.6 lb

## 2020-11-28 DIAGNOSIS — I350 Nonrheumatic aortic (valve) stenosis: Secondary | ICD-10-CM

## 2020-11-28 DIAGNOSIS — I712 Thoracic aortic aneurysm, without rupture, unspecified: Secondary | ICD-10-CM

## 2020-11-28 DIAGNOSIS — I2510 Atherosclerotic heart disease of native coronary artery without angina pectoris: Secondary | ICD-10-CM

## 2020-11-28 DIAGNOSIS — I10 Essential (primary) hypertension: Secondary | ICD-10-CM | POA: Diagnosis not present

## 2020-11-28 DIAGNOSIS — I2584 Coronary atherosclerosis due to calcified coronary lesion: Secondary | ICD-10-CM | POA: Diagnosis not present

## 2020-11-28 DIAGNOSIS — Z5181 Encounter for therapeutic drug level monitoring: Secondary | ICD-10-CM | POA: Diagnosis not present

## 2020-11-28 DIAGNOSIS — E785 Hyperlipidemia, unspecified: Secondary | ICD-10-CM

## 2020-11-28 MED ORDER — ATORVASTATIN CALCIUM 80 MG PO TABS: 80.0000 mg | ORAL_TABLET | Freq: Every day | ORAL | 3 refills | Status: DC

## 2020-11-28 MED ORDER — METOPROLOL SUCCINATE ER 25 MG PO TB24: 25.0000 mg | ORAL_TABLET | Freq: Every day | ORAL | 3 refills | Status: DC

## 2020-11-28 NOTE — Progress Notes (Signed)
Cardiology Office Note   Date:  11/28/2020   ID:  ERMIL SAXMAN, DOB Jan 19, 1946, MRN [id number]  PCP:  Lujean Amel, MD  Cardiologist:   Skeet Latch, MD   No chief complaint on file.    History of Present Illness: Justin Sutton is a 75 y.o. male with aortic stenosis, aortic aneurysm, coronary calcification, vasovagal syncope, hypertension, hyperlipidemia, seizures, peripheral arterial disease, prior EtOH abuse, and asbestos exposure here for follow up.  He was initially seen in 2016 for presurgical risk assessment. Mr. Woodke saw his PCP, Dr. Kinnie Feil and surgical clearance for a knee surgery.  He was incidentally noted to have coronary calcification on noncontrast head CT.  Echo 01/2015 showed mild aortic stenosis with a mean gradient of 12 mmHg.  it also showed a mild ascending aortic aneurysm.  At the time he was exercising regularly and had no symptoms.  He was felt to be low risk for surgery.  He had a repeat echo 06/2019 with LVEF 55-60%  with grade 1 diastolic dysfunction.  His ascending aorta was 4.5 cm and mean aortic valve gradient was 12 mmHg but the dimensionless index was 0.43.  [name] continues to do well.  He has retired and stays active around his house.  He notes that he is feeling better than he did last year.  He walks at least 2 miles every day.  He gets pain in his knee but has no shortness of breath or chest pain.  He denies any palpitations, lightheadedness, or dizziness.  He rarely gets lower extremity edema if he overdoes it in his yard.  It always improves with elevation of his legs.  He is otherwise well and without complaint.  He does not check his blood pressure at home occasionally and it is typically well-controlled.   Past Medical History:  Diagnosis Date   Arthritis    Chronic cough    Essential hypertension 02/14/2015   GERD (gastroesophageal reflux disease)    H/O asbestos exposure    Hearing loss    Heart murmur    Hyperlipidemia     Hypertension    Nocturia    Numbness    comes and goes per right hand    OSA (obstructive sleep apnea)    Thoracic aortic aneurysm without rupture ([place]) 02/14/2015   4.1 cm 01/2015    Past Surgical History:  Procedure Laterality Date   CATARACT EXTRACTION  2011   bilat    HAND SURGERY Right 1983   HERNIA REPAIR  2011   TONSILLECTOMY     TOTAL KNEE ARTHROPLASTY Left 2011   TOTAL KNEE ARTHROPLASTY Right 04/14/2015   Procedure: RIGHT TOTAL KNEE ARTHROPLASTY;  Surgeon: Gaynelle Arabian, MD;  Location: [facility];  Service: Orthopedics;  Laterality: Right;     Current Outpatient Medications  Medication Sig Dispense Refill   albuterol (VENTOLIN HFA) 108 (90 Base) MCG/ACT inhaler Inhale 2 puffs into the lungs every 6 (six) hours as needed for wheezing or shortness of breath. 6.7 g 3   amLODipine (NORVASC) 5 MG tablet Take 1 tablet (5 mg total) by mouth daily. 30 tablet 0   aspirin 81 MG chewable tablet Chew 81 mg by mouth daily.     levETIRAcetam (KEPPRA) 500 MG tablet Take 1 tablet by mouth 2 times daily. 180 tablet 3   Melatonin 5 MG TABS Take by mouth.     metoprolol succinate (TOPROL XL) 25 MG 24 hr tablet Take 1 tablet (25 mg total)  by mouth daily. 90 tablet 3   traZODone (DESYREL) 50 MG tablet Take 1 tablet by mouth as needed.     atorvastatin (LIPITOR) 80 MG tablet Take 1 tablet (80 mg total) by mouth daily. 90 tablet 3   No current facility-administered medications for this visit.    Allergies:   Patient has no known allergies.    Social History:  The patient  reports that he quit smoking about 50 years ago. His smoking use included cigarettes. He has a 45.00 pack-year smoking history. He has never used smokeless tobacco. He reports current alcohol use. He reports that he does not use drugs.   Family History:  The patient's family history includes Asthma in his mother; Heart disease in his father; Prostate cancer in his father.    ROS:  Please see the history of present  illness.   Otherwise, review of systems are positive for none.   All other systems are reviewed and negative.    PHYSICAL EXAM: VS:  BP 130/80   Pulse 69   Resp 20   Ht '5\' 10"'$  (1.778 m)   Wt 155 lb 9.6 oz (70.6 kg)   SpO2 98%   BMI 22.33 kg/m  , BMI Body mass index is 22.33 kg/m. GENERAL:  Well appearing HEENT: Pupils equal round and reactive, fundi not visualized, oral mucosa unremarkable NECK:  No jugular venous distention, waveform within normal limits, carotid upstroke brisk and symmetric, no bruits LUNGS:  Clear to auscultation bilaterally HEART:  RRR.  PMI not displaced or sustained,S1 and S2 within normal limits, no S3, no S4, no clicks, no rubs, III/VI mid-peaking systolic murmur at the LUSB ABD:  Flat, positive bowel sounds normal in frequency in pitch, no bruits, no rebound, no guarding, no midline pulsatile mass, no hepatomegaly, no splenomegaly EXT:  2 plus pulses throughout, no edema, no cyanosis no clubbing SKIN:  No rashes no nodules NEURO:  Cranial nerves II through XII grossly intact, motor grossly intact throughout PSYCH:  Cognitively intact, oriented to person place and time   EKG:  EKG is ordered today. 05/01/18: Sinus rhythm.  Rate 61 bpm.   06/26/2018: Sinus rhythm.  Rate 61 bpm.  First-degree AV block. 11/28/2020: Sinus rhythm.  Rate 69 bpm.  First-degree AV block.  QTc 480 ms.   Echo 01/27/15: Study Conclusions  - Left ventricle: The cavity size was normal. Wall thickness was   increased in a pattern of mild LVH. Systolic function was normal.   The estimated ejection fraction was in the range of 55% to 60%.   Wall motion was normal; there were no regional wall motion   abnormalities. Doppler parameters are consistent with abnormal   left ventricular relaxation (grade 1 diastolic dysfunction). The   E/e&' ratio is between 8-15, suggesting indeterminate LV filling   pressure. - Aortic valve: Trileaflet; mildly calcified leaflets. Mild   stenosis. There  was trivial regurgitation. Mean gradient (S): 12   mm Hg. Peak gradient (S): 30 mm Hg. Valve area (VTI): 1.78 cm^2.   Valve area (Vmean): 1.76 cm^2. - Aorta: Aortic root dimension: 41 mm (ED). - Ascending aorta: The ascending aorta was mildly dilated. - Mitral valve: Calcified annulus. There was trivial regurgitation. - Left atrium: The atrium was normal in size. - Right atrium: The atrium was mildly dilated. - Atrial septum: Mobile IAS. PFO cannot be excluded. - Tricuspid valve: There was mild regurgitation. - Pulmonary arteries: PA peak pressure: 30 mm Hg (S). - Inferior vena cava: The  vessel was normal in size. The   respirophasic diameter changes were in the normal range (= 50%),   consistent with normal central venous pressure.  Impressions:  - LVEF 55-60%, mild LVH, normal wall motion, diastolic dysfunction,   indeterminate LV filling pressure, mild aortic valve stenosis   (AVA 1.7-1.8 cm2) with trivial AI, dilated aortic root and   ascending aorta to 4.1 cm, mild RAE, mobile IAS - PFO cannot be   excluded, mild TR, RVSP 30 mmHg.   Recent Labs: No results found for requested labs within last 8760 hours.   01/20/15: WBC 7.3, Hgb 13.9, Plt 248 Na 137, K 4.6, BUN 13, Creatinine 0.77 AST 19, ALT 19  10/05/2017: Total cholesterol 245, triglycerides 122, HDL 65, LDL 156  02/16/2019: Total cholesterol 188, triglycerides 74, HDL 86, LDL 88  BUN 14, creatinine 0.88  Lipid Panel    Component Value Date/Time   CHOL 133 05/01/2018 0849   TRIG 59 05/01/2018 0849   HDL 68 05/01/2018 0849   CHOLHDL 2.0 05/01/2018 0849   LDLCALC 53 05/01/2018 0849      Wt Readings from Last 3 Encounters:  11/28/20 155 lb 9.6 oz (70.6 kg)  04/28/20 164 lb (74.4 kg)  06/26/19 176 lb (79.8 kg)      ASSESSMENT AND PLAN:  # Hypertension: BP remains well have been controlled on amlodipine.  We will add metoprolol succinate 25 mg daily due to his aortic aneurysm.  # Coronary  calcification: # Hyperlipidemia:  Coronary calcification previously noted on CT scan.  He remains active and has no anginal symptoms.  Continue aspirin, adding metoprolol, and increasing atorvastatin to 80 mg.  LDL was 98 on 03/2020.  Repeat lipids and a CMP in 2 to 3 months.  # Aortic stenosis: Mean gradient 13 mmHg on echo 06/2019.  The dimensionless index was more consistent with moderate aortic stenosis.  He remains asymptomatic.  Repeat echocardiogram.  # Ascending aortic aneurysm: Moderate ascending thoracic aortic aneurysm measuring 4.5 cm.  Blood pressures well have been controlled.  However we will add metoprolol succinate to reduce shear stress.  Repeat echo.  # Alcohol abuse: No EtOH in years.   Current medicines are reviewed at length with the patient today.  The patient does not have concerns regarding medicines.  The following changes have been made: Add metoprolol and increase atorvastatin.  Labs/ tests ordered today include:   Orders Placed This Encounter  Procedures   Lipid panel   Comprehensive metabolic panel   EKG [id number]   ECHOCARDIOGRAM COMPLETE      Disposition:   FU with Marketta Valadez C. Oval Linsey, MD in 1 year.   Signed, Skeet Latch, MD  11/28/2020 8:29 AM    [place]

## 2020-11-28 NOTE — Patient Instructions (Signed)
Medication Instructions:  START METOPROLOL SUC 25 MG DAILY   INCREASE YOUR ATORVASTATIN TO 80 MG DAILY   *If you need a refill on your cardiac medications before your next appointment, please call your pharmacy*  Lab Work: FASTING LP/CMET AROUND THANKSGIVING   If you have labs (blood work) drawn today and your tests are completely normal, you will receive your results only by: [place] (if you have MyChart) OR A paper copy in the mail If you have any lab test that is abnormal or we need to change your treatment, we will call you to review the results.  Testing/Procedures: Your physician has requested that you have an echocardiogram. Echocardiography is a painless test that uses sound waves to create images of your heart. It provides your doctor with information about the size and shape of your heart and how well your heart's chambers and valves are working. This procedure takes approximately one hour. There are no restrictions for this procedure.  [address]   Follow-Up: At [facility], you and your health needs are our priority.  As part of our continuing mission to provide you with exceptional heart care, we have created designated Provider Care Teams.  These Care Teams include your primary Cardiologist (physician) and Advanced Practice Providers (APPs -  Physician Assistants and Nurse Practitioners) who all work together to provide you with the care you need, when you need it.  We recommend signing up for the patient portal called "MyChart".  Sign up information is provided on this After Visit Summary.  MyChart is used to connect with patients for Virtual Visits (Telemedicine).  Patients are able to view lab/test results, encounter notes, upcoming appointments, etc.  Non-urgent messages can be sent to your provider as well.   To learn more about what you can do with MyChart, go to [email or web address].    Your next appointment:   12  month(s)  The format for your next appointment:   In Person  Provider:   Skeet Latch, MD or Laurann Montana, NP

## 2020-12-17 ENCOUNTER — Ambulatory Visit (HOSPITAL_COMMUNITY): Payer: Medicare Other | Attending: Cardiovascular Disease

## 2020-12-17 ENCOUNTER — Other Ambulatory Visit: Payer: Self-pay

## 2020-12-17 DIAGNOSIS — I350 Nonrheumatic aortic (valve) stenosis: Secondary | ICD-10-CM | POA: Diagnosis not present

## 2020-12-17 DIAGNOSIS — I712 Thoracic aortic aneurysm, without rupture, unspecified: Secondary | ICD-10-CM

## 2020-12-17 DIAGNOSIS — I10 Essential (primary) hypertension: Secondary | ICD-10-CM | POA: Diagnosis not present

## 2020-12-17 LAB — ECHOCARDIOGRAM COMPLETE
AR max vel: 1.33 cm2
AV Area VTI: 1.31 cm2
AV Area mean vel: 1.32 cm2
AV Mean grad: 10 mmHg
AV Peak grad: 19.4 mmHg
Ao pk vel: 2.2 m/s
Area-P 1/2: 3.27 cm2
P 1/2 time: 279 msec
S' Lateral: 3.1 cm

## 2021-01-08 ENCOUNTER — Other Ambulatory Visit: Payer: Self-pay | Admitting: *Deleted

## 2021-01-08 DIAGNOSIS — I712 Thoracic aortic aneurysm, without rupture, unspecified: Secondary | ICD-10-CM

## 2021-01-08 DIAGNOSIS — I350 Nonrheumatic aortic (valve) stenosis: Secondary | ICD-10-CM

## 2021-01-28 DIAGNOSIS — J069 Acute upper respiratory infection, unspecified: Secondary | ICD-10-CM | POA: Diagnosis not present

## 2021-01-28 DIAGNOSIS — U071 COVID-19: Secondary | ICD-10-CM | POA: Diagnosis not present

## 2021-02-17 DIAGNOSIS — Z23 Encounter for immunization: Secondary | ICD-10-CM | POA: Diagnosis not present

## 2021-03-24 DIAGNOSIS — Z5181 Encounter for therapeutic drug level monitoring: Secondary | ICD-10-CM | POA: Diagnosis not present

## 2021-03-24 DIAGNOSIS — E785 Hyperlipidemia, unspecified: Secondary | ICD-10-CM | POA: Diagnosis not present

## 2021-03-25 LAB — COMPREHENSIVE METABOLIC PANEL
ALT: 16 IU/L (ref 0–44)
AST: 21 IU/L (ref 0–40)
Albumin/Globulin Ratio: 2 (ref 1.2–2.2)
Albumin: 4.7 g/dL (ref 3.7–4.7)
Alkaline Phosphatase: 104 IU/L (ref 44–121)
BUN/Creatinine Ratio: 11 (ref 10–24)
BUN: 10 mg/dL (ref 8–27)
Bilirubin Total: 0.5 mg/dL (ref 0.0–1.2)
CO2: 24 mmol/L (ref 20–29)
Calcium: 9.5 mg/dL (ref 8.6–10.2)
Chloride: 101 mmol/L (ref 96–106)
Creatinine, Ser: 0.89 mg/dL (ref 0.76–1.27)
Globulin, Total: 2.3 g/dL (ref 1.5–4.5)
Glucose: 99 mg/dL (ref 70–99)
Potassium: 4.2 mmol/L (ref 3.5–5.2)
Sodium: 138 mmol/L (ref 134–144)
Total Protein: 7 g/dL (ref 6.0–8.5)
eGFR: 89 mL/min/{1.73_m2} (ref >59)

## 2021-03-25 LAB — LIPID PANEL
Chol/HDL Ratio: 1.8 ratio (ref 0.0–5.0)
Cholesterol, Total: 171 mg/dL (ref 100–199)
HDL: 97 mg/dL (ref >39)
LDL Chol Calc (NIH): 61 mg/dL (ref 0–99)
Triglycerides: 65 mg/dL (ref 0–149)
VLDL Cholesterol Cal: 13 mg/dL (ref 5–40)

## 2021-03-26 ENCOUNTER — Other Ambulatory Visit: Payer: Self-pay | Admitting: Neurology

## 2021-03-26 NOTE — Telephone Encounter (Signed)
Rx refilled.

## 2021-03-31 DIAGNOSIS — Z79899 Other long term (current) drug therapy: Secondary | ICD-10-CM | POA: Diagnosis not present

## 2021-03-31 DIAGNOSIS — F321 Major depressive disorder, single episode, moderate: Secondary | ICD-10-CM | POA: Diagnosis not present

## 2021-03-31 DIAGNOSIS — R569 Unspecified convulsions: Secondary | ICD-10-CM | POA: Diagnosis not present

## 2021-03-31 DIAGNOSIS — I7121 Aneurysm of the ascending aorta, without rupture: Secondary | ICD-10-CM | POA: Diagnosis not present

## 2021-03-31 DIAGNOSIS — E7800 Pure hypercholesterolemia, unspecified: Secondary | ICD-10-CM | POA: Diagnosis not present

## 2021-03-31 DIAGNOSIS — R7301 Impaired fasting glucose: Secondary | ICD-10-CM | POA: Diagnosis not present

## 2021-03-31 DIAGNOSIS — M19049 Primary osteoarthritis, unspecified hand: Secondary | ICD-10-CM | POA: Diagnosis not present

## 2021-03-31 DIAGNOSIS — I10 Essential (primary) hypertension: Secondary | ICD-10-CM | POA: Diagnosis not present

## 2021-03-31 DIAGNOSIS — I700 Atherosclerosis of aorta: Secondary | ICD-10-CM | POA: Diagnosis not present

## 2021-03-31 DIAGNOSIS — K219 Gastro-esophageal reflux disease without esophagitis: Secondary | ICD-10-CM | POA: Diagnosis not present

## 2021-03-31 DIAGNOSIS — Z0001 Encounter for general adult medical examination with abnormal findings: Secondary | ICD-10-CM | POA: Diagnosis not present

## 2021-04-27 NOTE — Progress Notes (Signed)
PATIENT: Justin Sutton DOB: 10-09-1945  REASON FOR VISIT: follow up for seizures HISTORY FROM: patient Primary Neurologist: Dr. Brett Fairy   HISTORY OF PRESENT ILLNESS: Today 04/28/21  Mr. Kotowski here today for follow-up with history of seizures.  He remains on Keppra 500 mg twice a day.  Last seizure was in 2018.  In the past, his seizures have been felt to be related to alcohol, but he did have an unprovoked seizure in November 2018.  He no longer drinks alcohol.  He is retired.  He helps take care of his 35-year-old grandson.  He drives a car.  He is hard of hearing, just got hearing aids.  No changes to health.  Here today alone.  Update 04/28/20 SS: Mr. Schwan is a 76 year old male with history of seizures.  He is well controlled on Keppra 500 mg twice daily.  Last seizure was in 2017 or 2018.  Tolerates medication well, denies side effect of irritability or moodiness.  No longer drinks alcohol.  He lives with his wife.  He uses a cane for walking stability.  Walking is his main form of exercise, 3 to 4 miles daily.  He used to be a Actor, now enjoys being a Production designer, theatre/television/film and doing projects.  On aspirin 81 mg daily.  Recently had physical, reports labs were unremarkable.  No new problems or concerns.  Here today for follow-up unaccompanied.  HISTORY 04/26/2019 SS: Mr. Doolen is a 76 year old male with history of seizures.  He is well controlled on Keppra.  He is taking Keppra 500 mg twice a day.  He has not had recurrent seizure in about 3 years.  He is tolerating Keppra without side effect.  He drives a car without difficulty.  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  Otherwise, his health is good.  He uses a cane for walking, walking is at times unsteady, has had 2 knee replacements.  He sleeps well for him, goes to bed around 9 PM, wakes up about 3 AM, which is typical.  He is not drinking alcohol.   He presents today for follow-up unaccompanied.  He says he sees his primary doctor once a year for physical.    REVIEW OF SYSTEMS: Out of a complete 14 system review of symptoms, the patient complains only of the following symptoms, and all other reviewed systems are negative.  N/a  ALLERGIES: No Known Allergies  HOME MEDICATIONS: Outpatient Medications Prior to Visit  Medication Sig Dispense Refill   amLODipine (NORVASC) 5 MG tablet Take 1 tablet (5 mg total) by mouth daily. 30 tablet 0   aspirin 81 MG chewable tablet Chew 81 mg by mouth daily.     atorvastatin (LIPITOR) 80 MG tablet Take 1 tablet (80 mg total) by mouth daily. 90 tablet 3   metoprolol succinate (TOPROL XL) 25 MG 24 hr tablet Take 1 tablet (25 mg total) by mouth daily. 90 tablet 3   traZODone (DESYREL) 50 MG tablet Take 1 tablet by mouth as needed.     levETIRAcetam (KEPPRA) 500 MG tablet TAKE 1 TABLET BY MOUTH TWICE A DAY 180 tablet 3   albuterol (VENTOLIN HFA) 108 (90 Base) MCG/ACT inhaler Inhale 2 puffs into the lungs every 6 (six) hours as needed for wheezing or shortness of breath. 6.7 g 3   Melatonin 5 MG TABS Take by mouth.     No facility-administered  medications prior to visit.    PAST MEDICAL HISTORY: Past Medical History:  Diagnosis Date   Arthritis    Chronic cough    Essential hypertension 02/14/2015   GERD (gastroesophageal reflux disease)    H/O asbestos exposure    Hearing loss    Heart murmur    Hyperlipidemia    Hypertension    Nocturia    Numbness    comes and goes per right hand    OSA (obstructive sleep apnea)    Thoracic aortic aneurysm without rupture ([place]) 02/14/2015   4.1 cm 01/2015    PAST SURGICAL HISTORY: Past Surgical History:  Procedure Laterality Date   CATARACT EXTRACTION  2011   bilat    HAND SURGERY Right 1983   HERNIA REPAIR  2011   TONSILLECTOMY     TOTAL KNEE ARTHROPLASTY Left 2011   TOTAL KNEE ARTHROPLASTY Right 04/14/2015   Procedure: RIGHT TOTAL KNEE  ARTHROPLASTY;  Surgeon: Gaynelle Arabian, MD;  Location: [facility];  Service: Orthopedics;  Laterality: Right;    FAMILY HISTORY: Family History  Problem Relation Age of Onset   Asthma Mother    Heart disease Father    Prostate cancer Father     SOCIAL HISTORY: Social History   Socioeconomic History   Marital status: Married    Spouse name: Not on file   Number of children: 3   Years of education: Not on file   Highest education level: Not on file  Occupational History   Occupation: Retired Therapist, art  Tobacco Use   Smoking status: Former    Packs/day: 3.00    Years: 15.00    Pack years: 45.00    Types: Cigarettes    Quit date: 04/05/1970    Years since quitting: 51.0   Smokeless tobacco: Never  Substance and Sexual Activity   Alcohol use: Yes    Comment: beer daily 2 per day    Drug use: No   Sexual activity: Not on file  Other Topics Concern   Not on file  Social History Narrative   Epwort Sleepiness Scale = 1 (as of 02/14/15)   Social Determinants of Health   Financial Resource Strain: Not on file  Food Insecurity: Not on file  Transportation Needs: Not on file  Physical Activity: Not on file  Stress: Not on file  Social Connections: Not on file  Intimate Partner Violence: Not on file   PHYSICAL EXAM  Vitals:   04/28/21 0739  BP: (!) 142/79  Pulse: 72  Weight: 155 lb (70.3 kg)  Height: 5\' 10"  (1.778 m)    Body mass index is 22.24 kg/m.  Generalized: Well developed, in no acute distress   Neurological examination  Mentation: Alert oriented to time, place, history taking. Follows all commands speech and language fluent Cranial nerve II-XII: Pupils were equal round reactive to light. Extraocular movements were full, visual field were full on confrontational test. Facial sensation and strength were normal. Head turning and shoulder shrug were normal and symmetric. Motor: The motor testing reveals 5 over 5 strength of all 4 extremities. Good symmetric motor tone  is noted throughout.  Sensory: Sensory testing is intact to soft touch on all 4 extremities. No evidence of extinction is noted.  Coordination: Cerebellar testing reveals good finger-nose-finger and heel-to-shin bilaterally.  Gait and station: Gait is slightly wide-based, but steady and independent Reflexes: Deep tendon reflexes are symmetric and normal bilaterally.   DIAGNOSTIC DATA (LABS, IMAGING, TESTING) - I reviewed patient records, labs, notes, testing  and imaging myself where available.  Lab Results  Component Value Date   WBC 9.9 02/16/2017   HGB 14.0 02/16/2017   HCT 41.2 02/16/2017   MCV 89.6 02/16/2017   PLT 335 02/16/2017      Component Value Date/Time   NA 138 03/24/2021 0850   K 4.2 03/24/2021 0850   CL 101 03/24/2021 0850   CO2 24 03/24/2021 0850   GLUCOSE 99 03/24/2021 0850   GLUCOSE 140 (H) 02/16/2017 1237   BUN 10 03/24/2021 0850   CREATININE 0.89 03/24/2021 0850   CALCIUM 9.5 03/24/2021 0850   PROT 7.0 03/24/2021 0850   ALBUMIN 4.7 03/24/2021 0850   AST 21 03/24/2021 0850   ALT 16 03/24/2021 0850   ALKPHOS 104 03/24/2021 0850   BILITOT 0.5 03/24/2021 0850   GFRNONAA 89 05/01/2018 0849   GFRAA 103 05/01/2018 0849   Lab Results  Component Value Date   CHOL 171 03/24/2021   HDL 97 03/24/2021   LDLCALC 61 03/24/2021   TRIG 65 03/24/2021   CHOLHDL 1.8 03/24/2021   No results found for: HGBA1C No results found for: VITAMINB12 Lab Results  Component Value Date   TSH 2.087 06/04/2016    ASSESSMENT AND PLAN 76 y.o. year old male  has a past medical history of Arthritis, Chronic cough, Essential hypertension (02/14/2015), GERD (gastroesophageal reflux disease), H/O asbestos exposure, Hearing loss, Heart murmur, Hyperlipidemia, Hypertension, Nocturia, Numbness, OSA (obstructive sleep apnea), and Thoracic aortic aneurysm without rupture ([place]) (02/14/2015). here with:  1.  History of seizures  -Mr. Knick continues to do well, last seizure was in  November 2018, unprovoked by alcohol -Continue Keppra 500 mg twice daily -Continue routine follow-up with PCP -Call for seizure activity, otherwise follow-up 1 year or sooner if needed -He will be followed by Dr. Brett Fairy since Dr. Jannifer Franklin has retired  Butler Denmark, AGNP-C, [place] 04/28/2021, 7:55 AM [facility] [address] [place], [place] [address] [phone]

## 2021-04-28 ENCOUNTER — Encounter: Payer: Self-pay | Admitting: Neurology

## 2021-04-28 ENCOUNTER — Ambulatory Visit (INDEPENDENT_AMBULATORY_CARE_PROVIDER_SITE_OTHER): Payer: Medicare Other | Admitting: Neurology

## 2021-04-28 VITALS — BP 142/79 | HR 72 | Ht 70.0 in | Wt 155.0 lb

## 2021-04-28 DIAGNOSIS — R569 Unspecified convulsions: Secondary | ICD-10-CM | POA: Diagnosis not present

## 2021-04-28 MED ORDER — LEVETIRACETAM 500 MG PO TABS: ORAL_TABLET | ORAL | 3 refills | Status: DC

## 2021-04-28 NOTE — Patient Instructions (Signed)
Great to see you today  Continue Keppra at current dosing Call for any seizures See you back in 1 year

## 2021-11-28 ENCOUNTER — Other Ambulatory Visit (HOSPITAL_BASED_OUTPATIENT_CLINIC_OR_DEPARTMENT_OTHER): Payer: Self-pay | Admitting: Cardiovascular Disease

## 2021-11-30 NOTE — Telephone Encounter (Signed)
Rx request sent to pharmacy.  

## 2021-11-30 NOTE — Telephone Encounter (Signed)
Please call to schedule. Pt needs appt with Dr. Oval Linsey or APP for refills. Thank you!!

## 2021-12-14 ENCOUNTER — Encounter (HOSPITAL_BASED_OUTPATIENT_CLINIC_OR_DEPARTMENT_OTHER): Payer: Self-pay | Admitting: Family

## 2021-12-14 ENCOUNTER — Ambulatory Visit (INDEPENDENT_AMBULATORY_CARE_PROVIDER_SITE_OTHER): Payer: Medicare Other | Admitting: Family

## 2021-12-14 VITALS — BP 118/82 | HR 73 | Ht 70.0 in | Wt 159.1 lb

## 2021-12-14 DIAGNOSIS — E785 Hyperlipidemia, unspecified: Secondary | ICD-10-CM

## 2021-12-14 DIAGNOSIS — I25118 Atherosclerotic heart disease of native coronary artery with other forms of angina pectoris: Secondary | ICD-10-CM | POA: Diagnosis not present

## 2021-12-14 DIAGNOSIS — I350 Nonrheumatic aortic (valve) stenosis: Secondary | ICD-10-CM | POA: Diagnosis not present

## 2021-12-14 DIAGNOSIS — I7121 Aneurysm of the ascending aorta, without rupture: Secondary | ICD-10-CM | POA: Diagnosis not present

## 2021-12-14 MED ORDER — METOPROLOL SUCCINATE ER 25 MG PO TB24: 25.0000 mg | ORAL_TABLET | Freq: Every day | ORAL | 3 refills | Status: DC

## 2021-12-14 MED ORDER — ATORVASTATIN CALCIUM 80 MG PO TABS: 80.0000 mg | ORAL_TABLET | Freq: Every day | ORAL | 3 refills | Status: DC

## 2021-12-14 NOTE — Progress Notes (Signed)
Office Visit    Patient Name: Justin Sutton Date of Encounter: 12/14/2021  PCP:  Lujean Amel, [place]  Cardiologist:  Skeet Latch, MD  Advanced Practice Provider:  No care team member to display Electrophysiologist:  None      Chief Complaint    Justin Sutton is a 76 y.o. male presents today for follow up of aortic stenosis   Past Medical History    Past Medical History:  Diagnosis Date   Arthritis    Chronic cough    Essential hypertension 02/14/2015   GERD (gastroesophageal reflux disease)    H/O asbestos exposure    Hearing loss    Heart murmur    Hyperlipidemia    Hypertension    Nocturia    Numbness    comes and goes per right hand    OSA (obstructive sleep apnea)    Thoracic aortic aneurysm without rupture ([place]) 02/14/2015   4.1 cm 01/2015   Past Surgical History:  Procedure Laterality Date   CATARACT EXTRACTION  2011   bilat    HAND SURGERY Right 1983   HERNIA REPAIR  2011   TONSILLECTOMY     TOTAL KNEE ARTHROPLASTY Left 2011   TOTAL KNEE ARTHROPLASTY Right 04/14/2015   Procedure: RIGHT TOTAL KNEE ARTHROPLASTY;  Surgeon: Gaynelle Arabian, MD;  Location: [facility];  Service: Orthopedics;  Laterality: Right;    Allergies  No Known Allergies  History of Present Illness    Justin Sutton is a 76 y.o. male with a hx of mild aortic stenosis, ascending aortic aneurysm, coronary artery calcification, vasovagal syncope, HTN, HLD, seizures, PAD, prior etoh abuse, asbestos exposure last seen 11/28/20.  Initially seen 2016 by Dr. Debbra Riding for clearance for knee surgery with inadvertent finding of coronary calcification on non contrast CT. Echo 01/2015 mild aortic stenosis mean gradient 64mHg and mild ascending aortic aneurysm. Repeat echo 06/2019 EF 55-60%, gr1DD, ascending aorta 4.5 cm and AV mean gradient 12 mmHg but dimensionless index 0.43. Echo 12/2020 mild aortic valve stenosis (mean gradient 10 mmHg), mild dilation  ascending aorta 454m   Pleasant gentleman who presents independently for follow up. 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 He walks in his neighborhood for exercise and follows a low sodium diet at home. Reports no shortness of breath nor dyspnea on exertion. Reports no chest pain, pressure, or tightness. No edema, orthopnea, PND. Reports no palpitations.    EKGs/Labs/Other Studies Reviewed:   The following studies were reviewed today:  Echo 12/2020  1. Left ventricular ejection fraction, by estimation, is 55 to 60%. The  left ventricle has normal function. The left ventricle demonstrates  regional wall motion abnormalities (see scoring diagram/findings for  description). There is mild asymmetric left  ventricular hypertrophy. Left ventricular diastolic parameters were  normal. There is severe hypokinesis of the left ventricular, basal  inferoseptal wall. The average left ventricular global longitudinal strain  is -19.9 %. The global longitudinal strain  is normal.   2. Right ventricular systolic function is mildly reduced. The right  ventricular size is mildly enlarged. There is normal pulmonary artery  systolic pressure.   3. The mitral valve is normal in structure. Trivial mitral valve  regurgitation.   4. The aortic valve is calcified. Aortic valve regurgitation is trivial.  Mild aortic valve stenosis.   5. Aortic dilatation noted. There is  mild dilatation of the ascending  aorta, measuring 42 mm.   EKG:  EKG is ordered today.  The ekg ordered today demonstrates NSR 73 bpm with first degree AV block PR 216 and isolated PVC. No acute ST/T wave changes.   Recent Labs: 03/24/2021: ALT 16; BUN 10; Creatinine, Ser 0.89; Potassium 4.2; Sodium 138  Recent Lipid Panel    Component Value Date/Time   CHOL 171 03/24/2021 0850   TRIG 65 03/24/2021 0850   HDL 97 03/24/2021 0850   CHOLHDL 1.8  03/24/2021 0850   LDLCALC 61 03/24/2021 0850   Home Medications   Current Meds  Medication Sig   amLODipine (NORVASC) 5 MG tablet Take 1 tablet (5 mg total) by mouth daily.   aspirin 81 MG chewable tablet Chew 81 mg by mouth daily.   levETIRAcetam (KEPPRA) 500 MG tablet TAKE 1 TABLET BY MOUTH TWICE A DAY   traZODone (DESYREL) 50 MG tablet Take 1 tablet by mouth as needed.   [DISCONTINUED] atorvastatin (LIPITOR) 80 MG tablet TAKE 1 TABLET BY MOUTH EVERY DAY   [DISCONTINUED] metoprolol succinate (TOPROL XL) 25 MG 24 hr tablet Take 1 tablet (25 mg total) by mouth daily.     Review of Systems     All other systems reviewed and are otherwise negative except as noted above.  Physical Exam    VS:  BP 118/82 (BP Location: Right Arm, Patient Position: Sitting, Cuff Size: Normal)   Pulse 73   Ht '5\' 10"'$  (1.778 m)   Wt 159 lb 1.6 oz (72.2 kg)   BMI 22.83 kg/m  , BMI Body mass index is 22.83 kg/m.  Wt Readings from Last 3 Encounters:  12/14/21 159 lb 1.6 oz (72.2 kg)  04/28/21 155 lb (70.3 kg)  11/28/20 155 lb 9.6 oz (70.6 kg)     GEN: Well nourished, well developed, in no acute distress. HEENT: normal. Neck: Supple, no JVD, carotid bruits, or masses. Cardiac: RRR, no murmurs, rubs, or gallops. No clubbing, cyanosis, edema.  Radials/PT 2+ and equal bilaterally.  Respiratory:  Respirations regular and unlabored, clear to auscultation bilaterally. GI: Soft, nontender, nondistended. MS: No deformity or atrophy. Skin: Warm and dry, no rash. Neuro:  Strength and sensation are intact. Psych: Normal affect.  Assessment & Plan    HTN - BP well controlled. Continue current antihypertensive regimen Amlodipine '5mg'$  QD, Toprol 25 mg daily.  Refills provided.  Coronary artery calcification on CT / HLD - 03/2021 LDL 51. GDMT includes aspirin, atorvastatin, metoprolol.  Refills provided.  Stable with no anginal symptoms. No indication for ischemic evaluation.  Heart healthy diet and regular  cardiovascular exercise encouraged.    Aortic stenosis / Ascending aortic aneurysm- Echo 12/2020 mild aortic stenosis and mild dilation ascending aorta 66m. Repeat echo scheduled for 01/2022. Continue optimal BP control. Educated to report chest pain, dyspnea, syncope.        Disposition: Follow up in 1 year(s) with TSkeet Latch MD or APP.  Signed, CLoel Dubonnet NP 12/14/2021, 8:58 AM [place]

## 2021-12-14 NOTE — Patient Instructions (Signed)
Medication Instructions:  Continue your current medications.   *If you need a refill on your cardiac medications before your next appointment, please call your pharmacy*   Lab Work: None ordered today.  Your cholesterol panel from December looked great!  Testing/Procedures: EKG today shows normal sinus rhythm which is great. It showed an occasional early beat which is normal and not of concern.   You have an echocardiogram already scheduled in October.   Follow-Up: At [facility], you and your health needs are our priority.  As part of our continuing mission to provide you with exceptional heart care, we have created designated Provider Care Teams.  These Care Teams include your primary Cardiologist (physician) and Advanced Practice Providers (APPs -  Physician Assistants and Nurse Practitioners) who all work together to provide you with the care you need, when you need it.  We recommend signing up for the patient portal called "MyChart".  Sign up information is provided on this After Visit Summary.  MyChart is used to connect with patients for Virtual Visits (Telemedicine).  Patients are able to view lab/test results, encounter notes, upcoming appointments, etc.  Non-urgent messages can be sent to your provider as well.   To learn more about what you can do with MyChart, go to [email or web address].    Your next appointment:   1 year(s)  The format for your next appointment:   In Person  Provider:   Skeet Latch, MD or Laurann Montana, NP    Other Instructions  Heart Healthy Diet Recommendations: A low-salt diet is recommended. Meats should be grilled, baked, or boiled. Avoid fried foods. Focus on lean protein sources like fish or chicken with vegetables and fruits. The American Heart Association is a Microbiologist!  American Heart Association Diet and Lifeystyle Recommendations   Exercise recommendations: The American Heart Association recommends 150 minutes of  moderate intensity exercise weekly. Try 30 minutes of moderate intensity exercise 4-5 times per week. This could include walking, jogging, or swimming.   Important Information About Sugar

## 2022-01-04 ENCOUNTER — Ambulatory Visit (INDEPENDENT_AMBULATORY_CARE_PROVIDER_SITE_OTHER): Payer: Medicare Other

## 2022-01-04 ENCOUNTER — Other Ambulatory Visit (HOSPITAL_BASED_OUTPATIENT_CLINIC_OR_DEPARTMENT_OTHER): Payer: Self-pay

## 2022-01-04 DIAGNOSIS — I7121 Aneurysm of the ascending aorta, without rupture: Secondary | ICD-10-CM

## 2022-01-04 DIAGNOSIS — I350 Nonrheumatic aortic (valve) stenosis: Secondary | ICD-10-CM

## 2022-01-04 DIAGNOSIS — I712 Thoracic aortic aneurysm, without rupture, unspecified: Secondary | ICD-10-CM | POA: Diagnosis not present

## 2022-01-04 DIAGNOSIS — I25118 Atherosclerotic heart disease of native coronary artery with other forms of angina pectoris: Secondary | ICD-10-CM

## 2022-01-04 LAB — ECHOCARDIOGRAM COMPLETE
AR max vel: 1.68 cm2
AV Area VTI: 1.68 cm2
AV Area mean vel: 1.62 cm2
AV Mean grad: 9 mmHg
AV Peak grad: 17.1 mmHg
AV Vena cont: 0.3 cm
Ao pk vel: 2.07 m/s
Area-P 1/2: 3.48 cm2
MV M vel: 2.26 m/s
MV Peak grad: 20.4 mmHg
P 1/2 time: 527 msec
S' Lateral: 3.24 cm

## 2022-04-08 DIAGNOSIS — Z79899 Other long term (current) drug therapy: Secondary | ICD-10-CM | POA: Diagnosis not present

## 2022-04-08 DIAGNOSIS — R7301 Impaired fasting glucose: Secondary | ICD-10-CM | POA: Diagnosis not present

## 2022-04-08 DIAGNOSIS — F321 Major depressive disorder, single episode, moderate: Secondary | ICD-10-CM | POA: Diagnosis not present

## 2022-04-08 DIAGNOSIS — E7800 Pure hypercholesterolemia, unspecified: Secondary | ICD-10-CM | POA: Diagnosis not present

## 2022-04-08 DIAGNOSIS — Z0001 Encounter for general adult medical examination with abnormal findings: Secondary | ICD-10-CM | POA: Diagnosis not present

## 2022-04-08 DIAGNOSIS — Z23 Encounter for immunization: Secondary | ICD-10-CM | POA: Diagnosis not present

## 2022-04-08 DIAGNOSIS — Z1211 Encounter for screening for malignant neoplasm of colon: Secondary | ICD-10-CM | POA: Diagnosis not present

## 2022-04-08 DIAGNOSIS — I10 Essential (primary) hypertension: Secondary | ICD-10-CM | POA: Diagnosis not present

## 2022-04-08 DIAGNOSIS — I700 Atherosclerosis of aorta: Secondary | ICD-10-CM | POA: Diagnosis not present

## 2022-04-08 DIAGNOSIS — I7121 Aneurysm of the ascending aorta, without rupture: Secondary | ICD-10-CM | POA: Diagnosis not present

## 2022-05-04 ENCOUNTER — Ambulatory Visit (INDEPENDENT_AMBULATORY_CARE_PROVIDER_SITE_OTHER): Payer: Medicare Other | Admitting: Neurology

## 2022-05-04 ENCOUNTER — Encounter: Payer: Self-pay | Admitting: Neurology

## 2022-05-04 VITALS — BP 151/93 | HR 83 | Ht 71.0 in | Wt 172.5 lb

## 2022-05-04 DIAGNOSIS — R569 Unspecified convulsions: Secondary | ICD-10-CM

## 2022-05-04 MED ORDER — LEVETIRACETAM 500 MG PO TABS: ORAL_TABLET | ORAL | 3 refills | Status: DC

## 2022-05-04 NOTE — Patient Instructions (Signed)
Great to see you today! We will continue the Keppra   Meds ordered this encounter  Medications   levETIRAcetam (KEPPRA) 500 MG tablet    Sig: TAKE 1 TABLET BY MOUTH TWICE A DAY    Dispense:  180 tablet    Refill:  3

## 2022-05-04 NOTE — Progress Notes (Signed)
PATIENT: Justin Sutton DOB: [birth year]  REASON FOR VISIT: follow up for seizures HISTORY FROM: patient Primary Neurologist: Dr. Brett Fairy   HISTORY OF PRESENT ILLNESS: Today 05/04/22  Doing very well today. No seizures. Remains on Keppra 500 mg BID. His knees give him an issue every now and then. No longer drinks alcohol. Has no issues today.   Update 04/28/21 SS: Justin Sutton here today for follow-up with history of seizures.  He remains on Keppra 500 mg twice a day.  Last seizure was in 2018.  In the past, his seizures have been felt to be related to alcohol, but he did have an unprovoked seizure in November 2018.  He no longer drinks alcohol.  He is retired.  He helps take care of his 36-year-old grandson.  He drives a car.  He is hard of hearing, just got hearing aids.  No changes to health.  Here today alone.  Update 04/28/20 SS: Justin Sutton is a 77 year old male with history of seizures.  He is well controlled on Keppra 500 mg twice daily.  Last seizure was in 2017 or 2018.  Tolerates medication well, denies side effect of irritability or moodiness.  No longer drinks alcohol.  He lives with his wife.  He uses a cane for walking stability.  Walking is his main form of exercise, 3 to 4 miles daily.  He used to be a Actor, now enjoys being a Production designer, theatre/television/film and doing projects.  On aspirin 81 mg daily.  Recently had physical, reports labs were unremarkable.  No new problems or concerns.  Here today for follow-up unaccompanied.  HISTORY 04/26/2019 SS: Justin Sutton is a 77 year old male with history of seizures.  He is well controlled on Keppra.  He is taking Keppra 500 mg twice a day.  He has not had recurrent seizure in about 3 years.  He is tolerating Keppra without side effect.  He drives a car without difficulty.  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  Otherwise, his health is good.  He uses a cane for  walking, walking is at times unsteady, has had 2 knee replacements.  He sleeps well for him, goes to bed around 9 PM, wakes up about 3 AM, which is typical.  He is not drinking alcohol.  He presents today for follow-up unaccompanied.  He says he sees his primary doctor once a year for physical.    REVIEW OF SYSTEMS: Out of a complete 14 system review of symptoms, the patient complains only of the following symptoms, and all other reviewed systems are negative.  N/a  ALLERGIES: No Known Allergies  HOME MEDICATIONS: Outpatient Medications Prior to Visit  Medication Sig Dispense Refill   amLODipine (NORVASC) 5 MG tablet Take 1 tablet (5 mg total) by mouth daily. 30 tablet 0   aspirin 81 MG chewable tablet Chew 81 mg by mouth daily.     atorvastatin (LIPITOR) 80 MG tablet Take 1 tablet (80 mg total) by mouth daily. 90 tablet 3   metoprolol succinate (TOPROL XL) 25 MG 24 hr tablet Take 1 tablet (25 mg total) by mouth daily. 90 tablet 3   traZODone (DESYREL) 50 MG tablet Take 1 tablet by mouth as needed.     levETIRAcetam (KEPPRA) 500 MG tablet TAKE 1 TABLET BY MOUTH TWICE A DAY 180 tablet 3   No facility-administered medications prior to visit.  PAST MEDICAL HISTORY: Past Medical History:  Diagnosis Date   Arthritis    Chronic cough    Essential hypertension 02/14/2015   GERD (gastroesophageal reflux disease)    H/O asbestos exposure    Hearing loss    Heart murmur    Hyperlipidemia    Hypertension    Nocturia    Numbness    comes and goes per right hand    OSA (obstructive sleep apnea)    Thoracic aortic aneurysm without rupture ([place]) 02/14/2015   4.1 cm 01/2015    PAST SURGICAL HISTORY: Past Surgical History:  Procedure Laterality Date   CATARACT EXTRACTION  2011   bilat    HAND SURGERY Right 1983   HERNIA REPAIR  2011   TONSILLECTOMY     TOTAL KNEE ARTHROPLASTY Left 2011   TOTAL KNEE ARTHROPLASTY Right 04/14/2015   Procedure: RIGHT TOTAL KNEE ARTHROPLASTY;  Surgeon:  Gaynelle Arabian, MD;  Location: [facility];  Service: Orthopedics;  Laterality: Right;    FAMILY HISTORY: Family History  Problem Relation Age of Onset   Asthma Mother    Heart disease Father    Prostate cancer Father     SOCIAL HISTORY: Social History   Socioeconomic History   Marital status: Married    Spouse name: Not on file   Number of children: 3   Years of education: Not on file   Highest education level: Not on file  Occupational History   Occupation: Retired Therapist, art  Tobacco Use   Smoking status: Former    Packs/day: 3.00    Years: 15.00    Total pack years: 45.00    Types: Cigarettes    Quit date: 04/05/1970    Years since quitting: 52.1   Smokeless tobacco: Never  Substance and Sexual Activity   Alcohol use: Yes    Comment: beer daily 2 per day    Drug use: No   Sexual activity: Not on file  Other Topics Concern   Not on file  Social History Narrative   Epwort Sleepiness Scale = 1 (as of 02/14/15)   Social Determinants of Health   Financial Resource Strain: Not on file  Food Insecurity: Not on file  Transportation Needs: Not on file  Physical Activity: Not on file  Stress: Not on file  Social Connections: Not on file  Intimate Partner Violence: Not on file   PHYSICAL EXAM  Vitals:   05/04/22 0740  BP: (!) 151/93  Pulse: 83  Weight: 172 lb 8 oz (78.2 kg)  Height: '5\' 11"'$  (1.803 m)   Body mass index is 24.06 kg/m.  Generalized: Well developed, in no acute distress   Neurological examination  Mentation: Alert oriented to time, place, history taking. Follows all commands speech and language fluent Cranial nerve II-XII: Pupils were equal round reactive to light. Extraocular movements were full, visual field were full on confrontational test. Facial sensation and strength were normal. Head turning and shoulder shrug were normal and symmetric. Motor: The motor testing reveals 5 over 5 strength of all 4 extremities. Good symmetric motor tone is noted  throughout.  Sensory: Sensory testing is intact to soft touch on all 4 extremities. No evidence of extinction is noted.  Coordination: Cerebellar testing reveals good finger-nose-finger and heel-to-shin bilaterally.  Gait and station: Gait is antalgic but steady and independent Reflexes: Deep tendon reflexes are symmetric and normal bilaterally.   DIAGNOSTIC DATA (LABS, IMAGING, TESTING) - I reviewed patient records, labs, notes, testing and imaging myself where available.  Lab Results  Component Value Date   WBC 9.9 02/16/2017   HGB 14.0 02/16/2017   HCT 41.2 02/16/2017   MCV 89.6 02/16/2017   PLT 335 02/16/2017      Component Value Date/Time   NA 138 03/24/2021 0850   K 4.2 03/24/2021 0850   CL 101 03/24/2021 0850   CO2 24 03/24/2021 0850   GLUCOSE 99 03/24/2021 0850   GLUCOSE 140 (H) 02/16/2017 1237   BUN 10 03/24/2021 0850   CREATININE 0.89 03/24/2021 0850   CALCIUM 9.5 03/24/2021 0850   PROT 7.0 03/24/2021 0850   ALBUMIN 4.7 03/24/2021 0850   AST 21 03/24/2021 0850   ALT 16 03/24/2021 0850   ALKPHOS 104 03/24/2021 0850   BILITOT 0.5 03/24/2021 0850   GFRNONAA 89 05/01/2018 0849   GFRAA 103 05/01/2018 0849   Lab Results  Component Value Date   CHOL 171 03/24/2021   HDL 97 03/24/2021   LDLCALC 61 03/24/2021   TRIG 65 03/24/2021   CHOLHDL 1.8 03/24/2021   No results found for: "HGBA1C" No results found for: "VITAMINB12" Lab Results  Component Value Date   TSH 2.087 06/04/2016    ASSESSMENT AND PLAN 77 y.o. year old male  has a past medical history of Arthritis, Chronic cough, Essential hypertension (02/14/2015), GERD (gastroesophageal reflux disease), H/O asbestos exposure, Hearing loss, Heart murmur, Hyperlipidemia, Hypertension, Nocturia, Numbness, OSA (obstructive sleep apnea), and Thoracic aortic aneurysm without rupture ([place]) (02/14/2015). here with:  1.  History of seizures  -Continues to do very well, last seizure was in November 2018 -Continue  Keppra 500 mg twice daily -Follow-up in 1 year or sooner if needed  Evangeline Dakin, DNP 05/04/2022, 7:57 AM [facility] [address] [place], [place] [address] [phone]

## 2022-05-12 DIAGNOSIS — R198 Other specified symptoms and signs involving the digestive system and abdomen: Secondary | ICD-10-CM | POA: Diagnosis not present

## 2022-05-12 DIAGNOSIS — Z86010 Personal history of colonic polyps: Secondary | ICD-10-CM | POA: Diagnosis not present

## 2022-05-12 DIAGNOSIS — Z1211 Encounter for screening for malignant neoplasm of colon: Secondary | ICD-10-CM | POA: Diagnosis not present

## 2022-06-09 DIAGNOSIS — Z23 Encounter for immunization: Secondary | ICD-10-CM | POA: Diagnosis not present

## 2022-07-06 DIAGNOSIS — Z1211 Encounter for screening for malignant neoplasm of colon: Secondary | ICD-10-CM | POA: Diagnosis not present

## 2022-07-06 DIAGNOSIS — Z1212 Encounter for screening for malignant neoplasm of rectum: Secondary | ICD-10-CM | POA: Diagnosis not present

## 2022-07-11 LAB — COLOGUARD: COLOGUARD: NEGATIVE

## 2022-09-26 ENCOUNTER — Observation Stay (HOSPITAL_BASED_OUTPATIENT_CLINIC_OR_DEPARTMENT_OTHER)
Admission: EM | Admit: 2022-09-26 | Discharge: 2022-09-28 | Disposition: A | Discharge status: Home / Self Care | Payer: Medicare Other | Attending: Family Medicine | Admitting: Family Medicine

## 2022-09-26 ENCOUNTER — Emergency Department (HOSPITAL_BASED_OUTPATIENT_CLINIC_OR_DEPARTMENT_OTHER): Payer: Medicare Other

## 2022-09-26 ENCOUNTER — Other Ambulatory Visit: Payer: Self-pay

## 2022-09-26 ENCOUNTER — Encounter (HOSPITAL_BASED_OUTPATIENT_CLINIC_OR_DEPARTMENT_OTHER): Payer: Self-pay

## 2022-09-26 DIAGNOSIS — Z87891 Personal history of nicotine dependence: Secondary | ICD-10-CM | POA: Insufficient documentation

## 2022-09-26 DIAGNOSIS — J811 Chronic pulmonary edema: Secondary | ICD-10-CM | POA: Diagnosis not present

## 2022-09-26 DIAGNOSIS — Z79899 Other long term (current) drug therapy: Secondary | ICD-10-CM | POA: Diagnosis not present

## 2022-09-26 DIAGNOSIS — I10 Essential (primary) hypertension: Secondary | ICD-10-CM | POA: Insufficient documentation

## 2022-09-26 DIAGNOSIS — Z1152 Encounter for screening for COVID-19: Secondary | ICD-10-CM | POA: Insufficient documentation

## 2022-09-26 DIAGNOSIS — R339 Retention of urine, unspecified: Secondary | ICD-10-CM | POA: Diagnosis not present

## 2022-09-26 DIAGNOSIS — R531 Weakness: Secondary | ICD-10-CM | POA: Diagnosis not present

## 2022-09-26 DIAGNOSIS — J069 Acute upper respiratory infection, unspecified: Secondary | ICD-10-CM | POA: Insufficient documentation

## 2022-09-26 DIAGNOSIS — E876 Hypokalemia: Secondary | ICD-10-CM | POA: Diagnosis not present

## 2022-09-26 DIAGNOSIS — R338 Other retention of urine: Secondary | ICD-10-CM | POA: Diagnosis present

## 2022-09-26 DIAGNOSIS — R29818 Other symptoms and signs involving the nervous system: Principal | ICD-10-CM | POA: Insufficient documentation

## 2022-09-26 DIAGNOSIS — Z96653 Presence of artificial knee joint, bilateral: Secondary | ICD-10-CM | POA: Diagnosis not present

## 2022-09-26 DIAGNOSIS — E871 Hypo-osmolality and hyponatremia: Secondary | ICD-10-CM | POA: Insufficient documentation

## 2022-09-26 DIAGNOSIS — R42 Dizziness and giddiness: Secondary | ICD-10-CM | POA: Diagnosis present

## 2022-09-26 DIAGNOSIS — E785 Hyperlipidemia, unspecified: Secondary | ICD-10-CM | POA: Diagnosis present

## 2022-09-26 DIAGNOSIS — R918 Other nonspecific abnormal finding of lung field: Secondary | ICD-10-CM | POA: Insufficient documentation

## 2022-09-26 DIAGNOSIS — I639 Cerebral infarction, unspecified: Secondary | ICD-10-CM

## 2022-09-26 DIAGNOSIS — Z7982 Long term (current) use of aspirin: Secondary | ICD-10-CM | POA: Diagnosis not present

## 2022-09-26 DIAGNOSIS — I2510 Atherosclerotic heart disease of native coronary artery without angina pectoris: Secondary | ICD-10-CM | POA: Diagnosis not present

## 2022-09-26 DIAGNOSIS — R4182 Altered mental status, unspecified: Secondary | ICD-10-CM | POA: Diagnosis not present

## 2022-09-26 DIAGNOSIS — I25118 Atherosclerotic heart disease of native coronary artery with other forms of angina pectoris: Secondary | ICD-10-CM

## 2022-09-26 DIAGNOSIS — G459 Transient cerebral ischemic attack, unspecified: Secondary | ICD-10-CM | POA: Diagnosis not present

## 2022-09-26 DIAGNOSIS — R569 Unspecified convulsions: Secondary | ICD-10-CM

## 2022-09-26 LAB — CBC WITH DIFFERENTIAL/PLATELET
Abs Immature Granulocytes: 0.05 10*3/uL (ref 0.00–0.07)
Basophils Absolute: 0 10*3/uL (ref 0.0–0.1)
Basophils Relative: 0 %
Eosinophils Absolute: 0 10*3/uL (ref 0.0–0.5)
Eosinophils Relative: 0 %
HCT: 38.7 % — ABNORMAL LOW (ref 39.0–52.0)
Hemoglobin: 13.6 g/dL (ref 13.0–17.0)
Immature Granulocytes: 1 %
Lymphocytes Relative: 6 %
Lymphs Abs: 0.7 10*3/uL (ref 0.7–4.0)
MCH: 30.6 pg (ref 26.0–34.0)
MCHC: 35.1 g/dL (ref 30.0–36.0)
MCV: 87 fL (ref 80.0–100.0)
Monocytes Absolute: 0.5 10*3/uL (ref 0.1–1.0)
Monocytes Relative: 4 %
Neutro Abs: 9.7 10*3/uL — ABNORMAL HIGH (ref 1.7–7.7)
Neutrophils Relative %: 89 %
Platelets: 338 10*3/uL (ref 150–400)
RBC: 4.45 MIL/uL (ref 4.22–5.81)
RDW: 12.2 % (ref 11.5–15.5)
WBC: 10.9 10*3/uL — ABNORMAL HIGH (ref 4.0–10.5)
nRBC: 0 % (ref 0.0–0.2)

## 2022-09-26 LAB — COMPREHENSIVE METABOLIC PANEL WITH GFR
ALT: 20 U/L (ref 0–44)
AST: 21 U/L (ref 15–41)
Albumin: 4.7 g/dL (ref 3.5–5.0)
Alkaline Phosphatase: 68 U/L (ref 38–126)
Anion gap: 10 (ref 5–15)
BUN: 8 mg/dL (ref 8–23)
CO2: 23 mmol/L (ref 22–32)
Calcium: 9.2 mg/dL (ref 8.9–10.3)
Chloride: 98 mmol/L (ref 98–111)
Creatinine, Ser: 0.64 mg/dL (ref 0.61–1.24)
GFR, Estimated: >60 mL/min (ref >60)
Glucose, Bld: 133 mg/dL — ABNORMAL HIGH (ref 70–99)
Potassium: 3.6 mmol/L (ref 3.5–5.1)
Sodium: 131 mmol/L — ABNORMAL LOW (ref 135–145)
Total Bilirubin: 0.7 mg/dL (ref 0.3–1.2)
Total Protein: 7.4 g/dL (ref 6.5–8.1)

## 2022-09-26 LAB — TROPONIN I (HIGH SENSITIVITY)
Troponin I (High Sensitivity): 14 ng/L (ref <18)
Troponin I (High Sensitivity): 13 ng/L (ref <18)

## 2022-09-26 LAB — RESP PANEL BY RT-PCR (RSV, FLU A&B, COVID)  RVPGX2
Influenza A by PCR: NEGATIVE
Influenza B by PCR: NEGATIVE
Resp Syncytial Virus by PCR: NEGATIVE
SARS Coronavirus 2 by RT PCR: NEGATIVE

## 2022-09-26 LAB — URINALYSIS, W/ REFLEX TO CULTURE (INFECTION SUSPECTED)
Bacteria, UA: NONE SEEN
Bilirubin Urine: NEGATIVE
Glucose, UA: NEGATIVE mg/dL
Hgb urine dipstick: NEGATIVE
Ketones, ur: NEGATIVE mg/dL
Leukocytes,Ua: NEGATIVE
Nitrite: NEGATIVE
Protein, ur: NEGATIVE mg/dL
Specific Gravity, Urine: 1.009 (ref 1.005–1.030)
pH: 8 (ref 5.0–8.0)

## 2022-09-26 LAB — RAPID URINE DRUG SCREEN, HOSP PERFORMED
Amphetamines: NOT DETECTED
Barbiturates: NOT DETECTED
Benzodiazepines: NOT DETECTED
Cocaine: NOT DETECTED
Opiates: NOT DETECTED
Tetrahydrocannabinol: POSITIVE — AB

## 2022-09-26 LAB — CBG MONITORING, ED: Glucose-Capillary: 138 mg/dL — ABNORMAL HIGH (ref 70–99)

## 2022-09-26 LAB — LACTIC ACID, PLASMA: Lactic Acid, Venous: 1 mmol/L (ref 0.5–1.9)

## 2022-09-26 LAB — ETHANOL: Alcohol, Ethyl (B): <10 mg/dL (ref <10)

## 2022-09-26 LAB — BRAIN NATRIURETIC PEPTIDE: B Natriuretic Peptide: 204.3 pg/mL — ABNORMAL HIGH (ref 0.0–100.0)

## 2022-09-26 LAB — MAGNESIUM: Magnesium: 1.6 mg/dL — ABNORMAL LOW (ref 1.7–2.4)

## 2022-09-26 MED ORDER — ONDANSETRON HCL 4 MG/2ML IJ SOLN
INTRAMUSCULAR | Status: AC
  Administered 2022-09-26: 4 mg via INTRAVENOUS
  Filled 2022-09-26: qty 2

## 2022-09-26 MED ORDER — SODIUM CHLORIDE 0.9 % IV SOLN
500.0000 mg | Freq: Once | INTRAVENOUS | Status: AC
  Administered 2022-09-26: 500 mg via INTRAVENOUS
  Filled 2022-09-26: qty 5

## 2022-09-26 MED ORDER — ATORVASTATIN CALCIUM 80 MG PO TABS
80.0000 mg | ORAL_TABLET | Freq: Every day | ORAL | Status: DC
  Administered 2022-09-26 – 2022-09-28 (×3): 80 mg via ORAL
  Filled 2022-09-26 (×2): qty 2
  Filled 2022-09-26: qty 1

## 2022-09-26 MED ORDER — SODIUM CHLORIDE 0.9 % IV SOLN
1.0000 g | INTRAVENOUS | Status: DC
  Administered 2022-09-27 – 2022-09-28 (×2): 1 g via INTRAVENOUS
  Filled 2022-09-26 (×2): qty 10

## 2022-09-26 MED ORDER — METOPROLOL SUCCINATE ER 25 MG PO TB24
25.0000 mg | ORAL_TABLET | Freq: Every day | ORAL | Status: DC
  Administered 2022-09-26 – 2022-09-27 (×2): 25 mg via ORAL
  Filled 2022-09-26 (×3): qty 1

## 2022-09-26 MED ORDER — AMLODIPINE BESYLATE 5 MG PO TABS
5.0000 mg | ORAL_TABLET | Freq: Every day | ORAL | Status: DC
  Administered 2022-09-26 – 2022-09-27 (×2): 5 mg via ORAL
  Filled 2022-09-26 (×2): qty 1

## 2022-09-26 MED ORDER — LEVETIRACETAM 500 MG PO TABS
500.0000 mg | ORAL_TABLET | Freq: Two times a day (BID) | ORAL | Status: DC
  Administered 2022-09-26 – 2022-09-28 (×4): 500 mg via ORAL
  Filled 2022-09-26 (×4): qty 1

## 2022-09-26 MED ORDER — ASPIRIN 81 MG PO CHEW
81.0000 mg | CHEWABLE_TABLET | Freq: Every day | ORAL | Status: DC
  Administered 2022-09-26 – 2022-09-28 (×3): 81 mg via ORAL
  Filled 2022-09-26 (×3): qty 1

## 2022-09-26 MED ORDER — ONDANSETRON HCL 4 MG/2ML IJ SOLN
4.0000 mg | Freq: Once | INTRAMUSCULAR | Status: AC
  Administered 2022-09-26: 4 mg via INTRAVENOUS
  Filled 2022-09-26: qty 2

## 2022-09-26 MED ORDER — SODIUM CHLORIDE 0.9 % IV SOLN
500.0000 mg | INTRAVENOUS | Status: DC
  Administered 2022-09-27 – 2022-09-28 (×2): 500 mg via INTRAVENOUS
  Filled 2022-09-26 (×2): qty 5

## 2022-09-26 MED ORDER — TRAZODONE HCL 50 MG PO TABS: 50.0000 mg | ORAL_TABLET | ORAL | Status: DC | PRN

## 2022-09-26 MED ORDER — LACTATED RINGERS IV BOLUS
1000.0000 mL | Freq: Once | INTRAVENOUS | Status: AC
  Administered 2022-09-26: 1000 mL via INTRAVENOUS

## 2022-09-26 MED ORDER — SODIUM CHLORIDE 0.9 % IV SOLN
1.0000 g | Freq: Once | INTRAVENOUS | Status: AC
  Administered 2022-09-26: 1 g via INTRAVENOUS
  Filled 2022-09-26: qty 10

## 2022-09-26 MED ORDER — TRAZODONE HCL 50 MG PO TABS: 50.0000 mg | ORAL_TABLET | Freq: Every evening | ORAL | Status: DC | PRN

## 2022-09-26 MED ORDER — ONDANSETRON HCL 4 MG/2ML IJ SOLN: 4.0000 mg | Freq: Once | INTRAMUSCULAR | Status: AC

## 2022-09-26 MED ORDER — LORAZEPAM 1 MG PO TABS
1.0000 mg | ORAL_TABLET | Freq: Once | ORAL | Status: AC
  Administered 2022-09-26: 1 mg via ORAL
  Filled 2022-09-26: qty 1

## 2022-09-26 NOTE — ED Notes (Signed)
Pt bladder scanned for 903 cc. Pt stood with 2 person assist,unable to void.will proceed with cath

## 2022-09-26 NOTE — ED Triage Notes (Signed)
Patient here POV from Home with Family.  Woke up with no Symptoms. Stated at 0400 he became dizzy. Since then he became progressively generally weak and tingling everywhere.   No Pain. No SOB. Dizziness has since subsided. No Unilateral Numbness or Tingling.   NAD Noted during Triage. A&Ox4. GCS 15. BIB Wheelchair

## 2022-09-26 NOTE — ED Notes (Signed)
Patient resting comfortably, even Resp, no distress noted at this time.

## 2022-09-26 NOTE — ED Notes (Signed)
Transported to ct 

## 2022-09-26 NOTE — ED Notes (Signed)
Pt's son was able to review footage from home security. Pt was fine until around 0710 this am. He was seen stumbling and losing balance while walking at 0710, was wiping his brow with napkin, and then around 0718 he was seen rushing to the bathroom and that is where his wife found him.

## 2022-09-26 NOTE — ED Notes (Signed)
Upon rounds pt noted to be restless, fidgety. States Hx of anxiety and requesting something for same. Pt A/O x4. EDP has been notified.

## 2022-09-26 NOTE — ED Provider Notes (Signed)
[place] EMERGENCY DEPARTMENT AT [facility] Provider Note   CSN: [id number] Arrival date & time: 09/26/22  1244     History  Chief Complaint  Patient presents with   Dizziness    Justin Sutton is a 77 y.o. male.  HPI 77 year old male presents with weakness and concern for stroke.  History is from patient but also wife and son at the bedside.  Patient went to bed last night around 7 or 8 PM and was fine.  He usually wakes up pretty early at about 4 or 5 AM though he does not specifically remember when he woke up this morning.  He does not remember making coffee which is concerning to the wife because he definitely did.  However otherwise he states that he has been feeling diffusely weak and having diffuse numbness/tingling in all 4 extremities.  He states he thinks he woke up fine but then noticed the symptoms shortly after waking up.  He denies any focal weakness but feels diffuse weakness/fatigue.  No headache, abdominal pain, chest pain, shortness of breath.  He thinks he urinated when he first got up for a small amount but has not urinated since.  He does not really feel like he needs to go too badly.  Family states that he was slurring his words earlier and it seemed like he was leaning to the left.  He was unable to walk which normally he requires a cane but otherwise can walk fine.  Wife states that when she was trying take his blood pressure, which turned out to be elevated in the 160 range, he had a hard time lifting up his right arm but eventually she was able to help him do it.  He's had vomiting and diarrhea this morning as well. Wife feels like he's breathing fast this morning.   Home Medications Prior to Admission medications   Medication Sig Start Date End Date Taking? Authorizing Provider  amLODipine (NORVASC) 5 MG tablet Take 1 tablet (5 mg total) by mouth daily. 06/07/16   Arrien, York Ram, MD  aspirin 81 MG chewable tablet Chew 81 mg by mouth daily.     [provider]  atorvastatin (LIPITOR) 80 MG tablet Take 1 tablet (80 mg total) by mouth daily. 12/14/21   Alver Sorrow, NP  levETIRAcetam (KEPPRA) 500 MG tablet TAKE 1 TABLET BY MOUTH TWICE A DAY 05/04/22   Glean Salvo, NP  metoprolol succinate (TOPROL XL) 25 MG 24 hr tablet Take 1 tablet (25 mg total) by mouth daily. 12/14/21   Alver Sorrow, NP  traZODone (DESYREL) 50 MG tablet Take 1 tablet by mouth as needed. 03/25/19   [provider]      Allergies    Patient has no known allergies.    Review of Systems   Review of Systems  Constitutional:  Negative for fever.  Eyes:  Negative for visual disturbance.  Respiratory:  Negative for shortness of breath.   Cardiovascular:  Negative for chest pain.  Gastrointestinal:  Positive for diarrhea and vomiting. Negative for abdominal pain.  Neurological:  Positive for weakness and numbness. Negative for headaches.    Physical Exam Updated Vital Signs BP (!) 173/99   Pulse 64   Temp (!) 97 F (36.1 C) (Temporal)   Resp (!) 21   Ht 5\' 11"  (1.803 m)   Wt 79.6 kg   SpO2 95%   BMI 24.46 kg/m  Physical Exam Vitals and nursing note reviewed.  Constitutional:  Appearance: He is well-developed.  HENT:     Head: Normocephalic and atraumatic.  Eyes:     Extraocular Movements: Extraocular movements intact.     Pupils: Pupils are equal, round, and reactive to light.  Cardiovascular:     Rate and Rhythm: Normal rate and regular rhythm.     Heart sounds: Normal heart sounds.  Pulmonary:     Effort: Pulmonary effort is normal. Tachypnea present.     Breath sounds: Normal breath sounds.  Abdominal:     Palpations: Abdomen is soft.     Tenderness: There is abdominal tenderness in the suprapubic area.  Skin:    General: Skin is warm and dry.  Neurological:     Mental Status: He is alert and oriented to person, place, and time.     Comments: CN 3-12 grossly intact. 5/5 strength in all 4 extremities. Grossly  normal sensation. Normal finger to nose. Does not have his teeth in today but wife feels like his speech is currently baseline.     ED Results / Procedures / Treatments   Labs (all labs ordered are listed, but only abnormal results are displayed) Labs Reviewed  COMPREHENSIVE METABOLIC PANEL - Abnormal; Notable for the following components:      Result Value   Sodium 131 (*)    Glucose, Bld 133 (*)    All other components within normal limits  MAGNESIUM - Abnormal; Notable for the following components:   Magnesium 1.6 (*)    All other components within normal limits  CBC WITH DIFFERENTIAL/PLATELET - Abnormal; Notable for the following components:   WBC 10.9 (*)    HCT 38.7 (*)    Neutro Abs 9.7 (*)    All other components within normal limits  RAPID URINE DRUG SCREEN, HOSP PERFORMED - Abnormal; Notable for the following components:   Tetrahydrocannabinol POSITIVE (*)    All other components within normal limits  BRAIN NATRIURETIC PEPTIDE - Abnormal; Notable for the following components:   B Natriuretic Peptide 204.3 (*)    All other components within normal limits  CBG MONITORING, ED - Abnormal; Notable for the following components:   Glucose-Capillary 138 (*)    All other components within normal limits  RESP PANEL BY RT-PCR (RSV, FLU A&B, COVID)  RVPGX2  CULTURE, BLOOD (ROUTINE X 2)  CULTURE, BLOOD (ROUTINE X 2)  ETHANOL  URINALYSIS, W/ REFLEX TO CULTURE (INFECTION SUSPECTED)  LACTIC ACID, PLASMA  TROPONIN I (HIGH SENSITIVITY)  TROPONIN I (HIGH SENSITIVITY)    EKG EKG Interpretation  Date/Time:  Sunday September 26 2022 12:49:25 EDT Ventricular Rate:  70 PR Interval:  259 QRS Duration: 95 QT Interval:  445 QTC Calculation: 481 R Axis:   68 Text Interpretation: Sinus rhythm Multiple premature complexes, vent & supraven Prolonged PR interval Left atrial enlargement Anteroseptal infarct, age indeterminate Confirmed by Pricilla Loveless [phone]) on 09/26/2022 1:14:38  PM  Radiology CT Head Wo Contrast  Result Date: 09/26/2022 CLINICAL DATA:  Mental status change, unknown cause EXAM: CT HEAD WITHOUT CONTRAST TECHNIQUE: Contiguous axial images were obtained from the base of the skull through the vertex without intravenous contrast. RADIATION DOSE REDUCTION: This exam was performed according to the departmental dose-optimization program which includes automated exposure control, adjustment of the mA and/or kV according to patient size and/or use of iterative reconstruction technique. COMPARISON:  02/16/2017 FINDINGS: Brain: No evidence of acute infarction, hemorrhage, hydrocephalus, extra-axial collection or mass lesion/mass effect. Extensive low-density changes within the periventricular and subcortical white matter most compatible  with chronic microvascular ischemic change. Mild diffuse cerebral volume loss. Vascular: Atherosclerotic calcifications involving the large vessels of the skull base. No unexpected hyperdense vessel. Skull: Normal. Negative for fracture or focal lesion. Sinuses/Orbits: No acute finding. Other: None. IMPRESSION: 1. No acute intracranial findings. 2. Chronic microvascular ischemic change and cerebral volume loss. Electronically Signed   By: Duanne Guess D.O.   On: 09/26/2022 13:49   DG Chest Portable 1 View  Result Date: 09/26/2022 CLINICAL DATA:  Weakness, dizziness EXAM: PORTABLE CHEST 1 VIEW COMPARISON:  03/23/2019 FINDINGS: The heart size and mediastinal contours are within normal limits. Diffuse bilateral interstitial pulmonary opacity. The visualized skeletal structures are unremarkable. IMPRESSION: Diffuse bilateral interstitial pulmonary opacity, consistent with edema or atypical/viral infection. No focal airspace opacity. Electronically Signed   By: Jearld Lesch M.D.   On: 09/26/2022 13:36    Procedures Procedures    Medications Ordered in ED Medications  cefTRIAXone (ROCEPHIN) 1 g in sodium chloride 0.9 % 100 mL IVPB (1 g  Intravenous New Bag/Given 09/26/22 1453)  azithromycin (ZITHROMAX) 500 mg in sodium chloride 0.9 % 250 mL IVPB (has no administration in time range)  lactated ringers bolus 1,000 mL (1,000 mLs Intravenous New Bag/Given 09/26/22 1327)  ondansetron (ZOFRAN) injection 4 mg (4 mg Intravenous Given 09/26/22 1500)    ED Course/ Medical Decision Making/ A&P Clinical Course as of 09/26/22 1500  Sun Sep 26, 2022  1310 Last seen in well is mostly last night.  Patient thinks he was normal when he first woke up but cannot give a clear time when this started.  Was definitely abnormal when the wife saw him at 8 AM.  Given this, especially with no current focal deficits, I do not think this is a code stroke while he may have had strokelike symptoms this morning.  Will proceed with workup but no thrombolytics indicated.  No signs of an LVO. [SG]    Clinical Course User Index [SG] Pricilla Loveless, MD                             Medical Decision Making Amount and/or Complexity of Data Reviewed Independent Historian: spouse Labs: ordered.    Details: Mild leukocytosis.  Normal lactate. Radiology: ordered and independent interpretation performed.    Details: Specific infiltrates on x-ray.  No lobar pneumonia.  CT head without head bleed. ECG/medicine tests: ordered and independent interpretation performed.    Details: PVCs but no ischemia.  Risk Prescription drug management. Decision regarding hospitalization.   Patient seems to have some generalized weakness but no focal weakness.  On my exam he has normal sensation though he was previously complaining of diffuse numbness.  Something he might of had a TIA with some speech difficulty and trouble moving his right arm and drifting when trying to walk.  At this point, though symptoms seem to be gone and so I think he might of had a TIA.  Does also have some tachypnea and is found to have some abnormal lung findings that could be edema versus infection.  He has  a chronic cough but in the setting of this presentation will treat with antibiotics, send respiratory panel, and add on BNP.  While he seems to be better from a mental status change, I think you will need TIA workup and given his history of hypertension, hyperlipidemia, age, I think it makes sense to do this in the hospital.  Discussed with Dr. Alinda Money, who will  admit. I have also consulted Dr. Selina Cooley of neuro, they will see in consult, and she agrees to TIA workup given report of arm weakness.  He was also found to have acute urinary retention which I think explains his lower abdominal discomfort.  Foley catheter was placed and repeat exam shows no abdominal tenderness.        Final Clinical Impression(s) / ED Diagnoses Final diagnoses:  TIA (transient ischemic attack)    Rx / DC Orders ED Discharge Orders     None         Pricilla Loveless, MD 09/26/22 1501

## 2022-09-27 ENCOUNTER — Observation Stay (HOSPITAL_COMMUNITY): Payer: Medicare Other

## 2022-09-27 ENCOUNTER — Encounter (HOSPITAL_COMMUNITY): Payer: Self-pay | Admitting: Internal Medicine

## 2022-09-27 DIAGNOSIS — R29818 Other symptoms and signs involving the nervous system: Secondary | ICD-10-CM | POA: Diagnosis not present

## 2022-09-27 DIAGNOSIS — Z1152 Encounter for screening for COVID-19: Secondary | ICD-10-CM | POA: Diagnosis not present

## 2022-09-27 DIAGNOSIS — I10 Essential (primary) hypertension: Secondary | ICD-10-CM | POA: Diagnosis not present

## 2022-09-27 DIAGNOSIS — G459 Transient cerebral ischemic attack, unspecified: Secondary | ICD-10-CM | POA: Diagnosis not present

## 2022-09-27 DIAGNOSIS — I6523 Occlusion and stenosis of bilateral carotid arteries: Secondary | ICD-10-CM | POA: Diagnosis not present

## 2022-09-27 DIAGNOSIS — R42 Dizziness and giddiness: Secondary | ICD-10-CM | POA: Diagnosis present

## 2022-09-27 DIAGNOSIS — Z7982 Long term (current) use of aspirin: Secondary | ICD-10-CM | POA: Diagnosis not present

## 2022-09-27 DIAGNOSIS — I2510 Atherosclerotic heart disease of native coronary artery without angina pectoris: Secondary | ICD-10-CM | POA: Diagnosis not present

## 2022-09-27 DIAGNOSIS — Z87891 Personal history of nicotine dependence: Secondary | ICD-10-CM | POA: Diagnosis not present

## 2022-09-27 DIAGNOSIS — E871 Hypo-osmolality and hyponatremia: Secondary | ICD-10-CM | POA: Diagnosis not present

## 2022-09-27 DIAGNOSIS — R918 Other nonspecific abnormal finding of lung field: Secondary | ICD-10-CM | POA: Diagnosis not present

## 2022-09-27 DIAGNOSIS — Z96653 Presence of artificial knee joint, bilateral: Secondary | ICD-10-CM | POA: Diagnosis not present

## 2022-09-27 DIAGNOSIS — R339 Retention of urine, unspecified: Secondary | ICD-10-CM | POA: Diagnosis not present

## 2022-09-27 DIAGNOSIS — Z79899 Other long term (current) drug therapy: Secondary | ICD-10-CM | POA: Diagnosis not present

## 2022-09-27 DIAGNOSIS — R338 Other retention of urine: Secondary | ICD-10-CM | POA: Diagnosis present

## 2022-09-27 DIAGNOSIS — J069 Acute upper respiratory infection, unspecified: Secondary | ICD-10-CM | POA: Diagnosis not present

## 2022-09-27 DIAGNOSIS — I6782 Cerebral ischemia: Secondary | ICD-10-CM | POA: Diagnosis not present

## 2022-09-27 DIAGNOSIS — E876 Hypokalemia: Secondary | ICD-10-CM | POA: Diagnosis not present

## 2022-09-27 LAB — CULTURE, BLOOD (ROUTINE X 2)
Culture: NO GROWTH
Special Requests: ADEQUATE

## 2022-09-27 LAB — CBC
HCT: 41.9 % (ref 39.0–52.0)
Hemoglobin: 14.3 g/dL (ref 13.0–17.0)
MCH: 29.5 pg (ref 26.0–34.0)
MCHC: 34.1 g/dL (ref 30.0–36.0)
MCV: 86.6 fL (ref 80.0–100.0)
Platelets: 365 10*3/uL (ref 150–400)
RBC: 4.84 MIL/uL (ref 4.22–5.81)
RDW: 12.2 % (ref 11.5–15.5)
WBC: 10.1 10*3/uL (ref 4.0–10.5)
nRBC: 0 % (ref 0.0–0.2)

## 2022-09-27 LAB — RESPIRATORY PANEL BY PCR

## 2022-09-27 LAB — COMPREHENSIVE METABOLIC PANEL
ALT: 24 U/L (ref 0–44)
AST: 27 U/L (ref 15–41)
Albumin: 4.2 g/dL (ref 3.5–5.0)
Alkaline Phosphatase: 69 U/L (ref 38–126)
Anion gap: 10 (ref 5–15)
BUN: 8 mg/dL (ref 8–23)
CO2: 21 mmol/L — ABNORMAL LOW (ref 22–32)
Calcium: 9 mg/dL (ref 8.9–10.3)
Chloride: 98 mmol/L (ref 98–111)
Creatinine, Ser: 0.68 mg/dL (ref 0.61–1.24)
GFR, Estimated: >60 mL/min (ref >60)
Glucose, Bld: 104 mg/dL — ABNORMAL HIGH (ref 70–99)
Potassium: 3.4 mmol/L — ABNORMAL LOW (ref 3.5–5.1)
Sodium: 129 mmol/L — ABNORMAL LOW (ref 135–145)
Total Bilirubin: 0.9 mg/dL (ref 0.3–1.2)
Total Protein: 7.2 g/dL (ref 6.5–8.1)

## 2022-09-27 LAB — STREP PNEUMONIAE URINARY ANTIGEN: Strep Pneumo Urinary Antigen: NEGATIVE

## 2022-09-27 LAB — HEMOGLOBIN A1C
Hgb A1c MFr Bld: 5.4 % (ref 4.8–5.6)
Mean Plasma Glucose: 108.28 mg/dL

## 2022-09-27 LAB — EXPECTORATED SPUTUM ASSESSMENT W GRAM STAIN, RFLX TO RESP C

## 2022-09-27 LAB — FOLATE: Folate: 29.3 ng/mL (ref >5.9)

## 2022-09-27 LAB — SODIUM, URINE, RANDOM: Sodium, Ur: 59 mmol/L

## 2022-09-27 LAB — PROCALCITONIN: Procalcitonin: <0.1 ng/mL

## 2022-09-27 LAB — OSMOLALITY, URINE: Osmolality, Ur: 429 mOsm/kg (ref 300–900)

## 2022-09-27 LAB — VITAMIN B12: Vitamin B-12: 279 pg/mL (ref 180–914)

## 2022-09-27 MED ORDER — CLOPIDOGREL BISULFATE 75 MG PO TABS
75.0000 mg | ORAL_TABLET | Freq: Every day | ORAL | Status: DC
  Administered 2022-09-28: 75 mg via ORAL
  Filled 2022-09-27: qty 1

## 2022-09-27 MED ORDER — STROKE: EARLY STAGES OF RECOVERY BOOK
Freq: Once | Status: AC
  Filled 2022-09-27: qty 1

## 2022-09-27 MED ORDER — CLOPIDOGREL BISULFATE 75 MG PO TABS
300.0000 mg | ORAL_TABLET | Freq: Once | ORAL | Status: AC
  Administered 2022-09-27: 300 mg via ORAL
  Filled 2022-09-27: qty 4

## 2022-09-27 MED ORDER — IOHEXOL 350 MG/ML SOLN
75.0000 mL | Freq: Once | INTRAVENOUS | Status: AC | PRN
  Administered 2022-09-27: 75 mL via INTRAVENOUS

## 2022-09-27 MED ORDER — CHLORHEXIDINE GLUCONATE CLOTH 2 % EX PADS
6.0000 | MEDICATED_PAD | Freq: Every day | CUTANEOUS | Status: DC
  Administered 2022-09-27: 6 via TOPICAL

## 2022-09-27 MED ORDER — ONDANSETRON HCL 4 MG PO TABS: 4.0000 mg | ORAL_TABLET | Freq: Four times a day (QID) | ORAL | Status: DC | PRN

## 2022-09-27 MED ORDER — ACETAMINOPHEN 325 MG PO TABS
650.0000 mg | ORAL_TABLET | Freq: Once | ORAL | Status: AC
  Administered 2022-09-27: 650 mg via ORAL
  Filled 2022-09-27: qty 2

## 2022-09-27 MED ORDER — MAGNESIUM SULFATE 2 GM/50ML IV SOLN
2.0000 g | Freq: Once | INTRAVENOUS | Status: AC
  Administered 2022-09-27: 2 g via INTRAVENOUS
  Filled 2022-09-27: qty 50

## 2022-09-27 MED ORDER — POTASSIUM CHLORIDE IN NACL 20-0.9 MEQ/L-% IV SOLN: INTRAVENOUS | Status: AC

## 2022-09-27 MED ORDER — POTASSIUM CHLORIDE IN NACL 20-0.9 MEQ/L-% IV SOLN
INTRAVENOUS | Status: DC
  Filled 2022-09-27: qty 1000

## 2022-09-27 MED ORDER — ACETAMINOPHEN 650 MG RE SUPP: 650.0000 mg | Freq: Four times a day (QID) | RECTAL | Status: DC | PRN

## 2022-09-27 MED ORDER — ACETAMINOPHEN 325 MG PO TABS
650.0000 mg | ORAL_TABLET | Freq: Four times a day (QID) | ORAL | Status: DC | PRN
  Administered 2022-09-27: 650 mg via ORAL
  Filled 2022-09-27: qty 2

## 2022-09-27 MED ORDER — ONDANSETRON HCL 4 MG/2ML IJ SOLN
4.0000 mg | Freq: Four times a day (QID) | INTRAMUSCULAR | Status: DC | PRN
  Administered 2022-09-27: 4 mg via INTRAVENOUS
  Filled 2022-09-27: qty 2

## 2022-09-27 NOTE — H&P (Signed)
History and Physical    Patient: Justin Sutton UJW:[id number] DOB: Mar 15, 1946 DOA: 09/26/2022 DOS: the patient was seen and examined on 09/27/2022 PCP: Darrow Bussing, MD  Patient coming from: Home  Chief Complaint:  Chief Complaint  Patient presents with   Dizziness   HPI: Justin Sutton is a 77 y.o. male with medical history significant of osteoarthritis, chronic cough, GERD, stress test exposure, hearing loss, heart murmur, hyperlipidemia, hypertension, history of right hand numbness, obstructive sleep apnea, thoracic aortic aneurysm, aortic Stenosis, CAD, history of alcohol abuse, history of alcohol withdrawal with delirium, history of seizures who presented to the emergency department complaints of dizziness, slurred speech, generalized weakness and altered mental status who presented to the emergency department with complaints of dizziness after he woke up early yesterday morning associated with generalized weakness, diaphoresis, transient epigastric abdominal pain, nausea and an episode of emesis.  The patient endorses that his lower extremities were feeling numb, but symptoms were bilateral and nonfocal.  His family members also stated that his mental status was slow yesterday but he is back to baseline at this time.  He has also been complaining of sinus headache, not rhinorrhea, nasal congestion, mild sore throat and mild nonproductive cough. He denied fever, wheezing or hemoptysis.  No chest pain, palpitations, diaphoresis, PND, orthopnea or pitting edema of the lower extremities.  No diarrhea, constipation, melena or hematochezia.  No flank pain, dysuria, frequency or hematuria.  No polyuria, polydipsia, polyphagia or blurred vision.   ED course: Initial vital signs were temperature 97 F, pulse 68, respiration 18, BP 156/107 mmHg O2 sat 95% on room air.  The patient received LR 1000 mL bolus, ondansetron 4 mg IVP x 2, ceftriaxone 1 g IVPB, azithromycin 500 mg IVP, acetaminophen 650 mg  p.o. x 1, lorazepam 1 tablet p.o. IV in his home medications were ordered.  While in the emergency department, the patient had an acute urinary retention and a Foley catheter had to be placed.  Lab work: His urinalysis was normal.  UDS was positive for THC.  CBCs are white count 10.9, hemoglobin 13.6 g/dL platelets 562.  Lactic acid, troponin x 2 and alcohol level were normal.  BNP 204.3 pg/mL.  Magnesium 1.6 mg/dL.  CMP showed a sodium 131 mmol/L and glucose of 133 mg/dL.  The rest of the CMP measurements were normal.  Imaging: Portable 1 view chest radiograph showed diffuse bilateral interstitial pulmonary opacity, consistent with edema or atypical/viral infection.  No focal airspace opacity.  CT head without contrast showed no acute intracranial findings.  There are chronic microvascular ischemic changes and cerebral volume loss.   Review of Systems: As mentioned in the history of present illness. All other systems reviewed and are negative. Past Medical History:  Diagnosis Date   Arthritis    Chronic cough    Essential hypertension 02/14/2015   GERD (gastroesophageal reflux disease)    H/O asbestos exposure    Hearing loss    Heart murmur    Hyperlipidemia    Hypertension    Nocturia    Numbness    comes and goes per right hand    OSA (obstructive sleep apnea)    Thoracic aortic aneurysm without rupture (HCC) 02/14/2015   4.1 cm 01/2015   Past Surgical History:  Procedure Laterality Date   CATARACT EXTRACTION  2011   bilat    HAND SURGERY Right 1983   HERNIA REPAIR  2011   TONSILLECTOMY     TOTAL KNEE ARTHROPLASTY Left 2011  TOTAL KNEE ARTHROPLASTY Right 04/14/2015   Procedure: RIGHT TOTAL KNEE ARTHROPLASTY;  Surgeon: Ollen Gross, MD;  Location: [facility];  Service: Orthopedics;  Laterality: Right;   Social History:  reports that he quit smoking about 52 years ago. His smoking use included cigarettes. He has a 45.00 pack-year smoking history. He has never used smokeless tobacco.  He reports current alcohol use. He reports that he does not use drugs.  No Known Allergies  Family History  Problem Relation Age of Onset   Asthma Mother    Heart disease Father    Prostate cancer Father     Prior to Admission medications   Medication Sig Start Date End Date Taking? Authorizing Provider  amLODipine (NORVASC) 5 MG tablet Take 1 tablet (5 mg total) by mouth daily. 06/07/16   Arrien, York Ram, MD  aspirin 81 MG chewable tablet Chew 81 mg by mouth daily.    [provider]  atorvastatin (LIPITOR) 80 MG tablet Take 1 tablet (80 mg total) by mouth daily. 12/14/21   Alver Sorrow, NP  levETIRAcetam (KEPPRA) 500 MG tablet TAKE 1 TABLET BY MOUTH TWICE A DAY 05/04/22   Glean Salvo, NP  metoprolol succinate (TOPROL XL) 25 MG 24 hr tablet Take 1 tablet (25 mg total) by mouth daily. 12/14/21   Alver Sorrow, NP  traZODone (DESYREL) 50 MG tablet Take 1 tablet by mouth as needed. 03/25/19   [provider]    Physical Exam: Vitals:   09/27/22 0900 09/27/22 0914 09/27/22 0930 09/27/22 1100  BP: (!) 126/91  (!) 141/92 (!) 168/88  Pulse: 88 69 71 68  Resp: (!) 26 18 (!) 25 16  Temp:    98.9 F (37.2 C)  TempSrc:    Oral  SpO2: 94% 94% 95% 96%  Weight:    79.6 kg  Height:    5\' 11"  (1.803 m)   Physical Exam Vitals and nursing note reviewed.  Constitutional:      General: He is awake. He is not in acute distress.    Appearance: Normal appearance.  HENT:     Head: Normocephalic.     Nose: No rhinorrhea.     Mouth/Throat:     Mouth: Mucous membranes are moist.  Eyes:     General: No scleral icterus.    Pupils: Pupils are equal, round, and reactive to light.  Neck:     Vascular: No JVD.  Cardiovascular:     Rate and Rhythm: Normal rate and regular rhythm.     Heart sounds: S1 normal and S2 normal.  Pulmonary:     Effort: Pulmonary effort is normal.     Breath sounds: Normal breath sounds.  Abdominal:     General: Bowel sounds are  normal. There is no distension.     Palpations: Abdomen is soft.     Tenderness: There is no abdominal tenderness.  Musculoskeletal:     Cervical back: Neck supple.     Right lower leg: No edema.     Left lower leg: No edema.  Skin:    General: Skin is warm and dry.  Neurological:     General: No focal deficit present.     Mental Status: He is alert and oriented to person, place, and time.     Cranial Nerves: No cranial nerve deficit.  Psychiatric:        Mood and Affect: Mood normal.        Behavior: Behavior normal. Behavior is cooperative.  Data Reviewed:  Results are pending, will review when available.  October/2023 transthoracic echocardiogram IMPRESSIONS:   1. Left ventricular ejection fraction, by estimation, is 60 to 65%. The  left ventricle has normal function. The left ventricle has no regional  wall motion abnormalities. There is mild left ventricular hypertrophy.  Left ventricular diastolic parameters  are consistent with Grade I diastolic dysfunction (impaired relaxation).   2. Right ventricular systolic function is normal. The right ventricular  size is normal. There is normal pulmonary artery systolic pressure. The  estimated right ventricular systolic pressure is 30.3 mmHg.   3. Left atrial size was mildly dilated.   4. Right atrial size was moderately dilated.   5. The mitral valve is abnormal. Trivial mitral valve regurgitation.   6. The aortic valve is tricuspid. There is moderate calcification of the  aortic valve. Aortic valve regurgitation is trivial. Mild aortic valve  stenosis. Aortic regurgitation PHT measures 527 msec. Aortic valve area,  by VTI measures 1.68 cm. Aortic  valve mean gradient measures 9.0 mmHg. Aortic valve Vmax measures 2.07  m/s. Peak gradient 17 mmHg, DI 0.40.   7. Aortic dilatation noted. There is borderline dilatation of the aortic  root, measuring 38 mm. There is moderate dilatation of the ascending  aorta, measuring 46  mm.   8. The inferior vena cava is dilated in size with >50% respiratory  variability, suggesting right atrial pressure of 8 mmHg.   EKG: Vent. rate 70 BPM PR interval 259 ms QRS duration 95 ms QT/QTcB 445/481 ms P-R-T axes 57 68 59 Sinus rhythm Multiple premature complexes, vent & supraven Prolonged PR interval Left atrial enlargement Anteroseptal infarct, age indeterminate  Assessment and Plan: Principal Problem:   Acute focal neurological deficit Symptoms seem presyncopal episode. No obvious focalities. Observation/telemetry. Frequent neurochecks. Continue low-dose ASA daily. Consult PT and OT. Check fasting lipids. Check hemoglobin A1c. Check carotid Doppler. Check echocardiogram. MRI of brain as needed per neurology. Risk factors modifications. Stroke team has been consulted.  Active Problems:   Altered mental status Seems to have resolved. Continue neurochecks.    Pulmonary infiltrates on CXR In the setting of recent:   URI (upper respiratory infection) Symptoms seem to be viral. Will continue antibiotics for now. Check procalcitonin level. Check respiratory pathogen by PCR.    Hypokalemia Replacing. Follow potassium level.    Hypomagnesemia Replacing. Follow-up level as needed.    Hyponatremia Likely due to GI losses. Time-limited IV hydration with NS. Follow-up sodium level in AM.    Acute urinary retention Will discontinue Foley. Will allow him to try to void on his own.    Seizures (HCC) Continue Keppra 500 mg p.o. twice daily.    Essential hypertension Pending stroke team evaluation Allow permissive hypertension for now.    Hyperlipidemia Continue atorvastatin 80 mg p.o. daily.    Advance Care Planning:   Code Status: Full Code   Consults: Neuro hospitalist team Onalee Hua, MD).  Family Communication: His spouse and daughter were present at bedside.  Severity of Illness: The appropriate patient status for this  patient is OBSERVATION. Observation status is judged to be reasonable and necessary in order to provide the required intensity of service to ensure the patient's safety. The patient's presenting symptoms, physical exam findings, and initial radiographic and laboratory data in the context of their medical condition is felt to place them at decreased risk for further clinical deterioration. Furthermore, it is anticipated that the patient will be medically stable for discharge from the  hospital within 2 midnights of admission.   Author: Bobette Mo, MD 09/27/2022 12:35 PM  For on call review [email or web address].   This document was prepared using Dragon voice recognition software and may contain some unintended transcription errors.

## 2022-09-27 NOTE — ED Notes (Signed)
Pt assisted to recliner in room. Provided pt with breakfast of cola and grits

## 2022-09-27 NOTE — Progress Notes (Signed)
Carotid artery duplex has been completed. Preliminary results can be found in CV Proc through chart review.   09/27/22 4:30 PM Olen Cordial RVT

## 2022-09-27 NOTE — Plan of Care (Signed)

## 2022-09-27 NOTE — Consult Note (Addendum)
NEUROLOGY CONSULTATION NOTE   Date of service: September 27, 2022 Patient Name: Justin Sutton MRN:  [id number] DOB:  [birth year] Reason for consult: "Possible stroke" _ _ _   _ __   _ __ _ _  __ __   _ __   __ _  History of Present Illness  Justin Sutton is a 77 y.o. male with PMH significant for  has a past medical history of Arthritis, Chronic cough, Essential hypertension (02/14/2015), GERD (gastroesophageal reflux disease), H/O asbestos exposure, Hearing loss, Heart murmur, Hyperlipidemia, Hypertension, Nocturia, Numbness, OSA (obstructive sleep apnea), and Thoracic aortic aneurysm without rupture (HCC) (02/14/2015). who presents with  dizziness, N/V, and BLE incoordination concerning for acute stroke.   History is provided by patient as well as his spouse who is at bedside. Patient states that he woke up on the day of admission and was able to ambulate with his cane without any difficulty. When he went to stand up he felt the room spinning and eventually developed nausea, vomiting, and incoordination of his lower extremities. His sons had to place him on the bed, but his symptoms persisted.  His spouse discussed his symptoms with a family friend who stated he could be having a stroke and recommended he go to the hospital for further evaluation.  He has never had symptoms like this in the past.  He does state that he has had bilateral total knee arthroplasties, but notes no physical limitations from these. He states that his symptoms persisted throughout the day but did improve once he was transferred from [facility] to [facility] and at present are completely resolved.    In the ED, patient noted to have hyponatremia with sodium 131, potassium 3.6, no leukocytosis, magnesium of 1.6, and CT head without contrast notable for chronic microvascular ischemic changes and cerebral volume loss, without acute findings.   ROS   Constitutional Denies weight loss, fever and chills.   HEENT Denies changes in  vision and hearing.   Respiratory Denies SOB and cough.   CV Denies palpitations and CP   GI Notes nausea, vomiting  GU Denies dysuria and urinary frequency.   MSK Denies myalgia and joint pain.   Skin Denies rash and pruritus.   Neurological Denies headache and syncope.   Psychiatric Denies recent changes in mood. Denies anxiety and depression.    Past History   Past Medical History:  Diagnosis Date   Arthritis    Chronic cough    Essential hypertension 02/14/2015   GERD (gastroesophageal reflux disease)    H/O asbestos exposure    Hearing loss    Heart murmur    Hyperlipidemia    Hypertension    Nocturia    Numbness    comes and goes per right hand    OSA (obstructive sleep apnea)    Thoracic aortic aneurysm without rupture (HCC) 02/14/2015   4.1 cm 01/2015   Past Surgical History:  Procedure Laterality Date   CATARACT EXTRACTION  2011   bilat    HAND SURGERY Right 1983   HERNIA REPAIR  2011   TONSILLECTOMY     TOTAL KNEE ARTHROPLASTY Left 2011   TOTAL KNEE ARTHROPLASTY Right 04/14/2015   Procedure: RIGHT TOTAL KNEE ARTHROPLASTY;  Surgeon: Ollen Gross, MD;  Location: [facility];  Service: Orthopedics;  Laterality: Right;   Family History  Problem Relation Age of Onset   Asthma Mother    Heart disease Father    Prostate cancer Father    Social History  Socioeconomic History   Marital status: Married    Spouse name: Not on file   Number of children: 3   Years of education: Not on file   Highest education level: Not on file  Occupational History   Occupation: Retired Cabin crew  Tobacco Use   Smoking status: Former    Packs/day: 3.00    Years: 15.00    Additional pack years: 0.00    Total pack years: 45.00    Types: Cigarettes    Quit date: 04/05/1970    Years since quitting: 52.5   Smokeless tobacco: Never  Substance and Sexual Activity   Alcohol use: Yes    Comment: beer daily 2 per day    Drug use: No   Sexual activity: Not on file  Other Topics Concern    Not on file  Social History Narrative   Epwort Sleepiness Scale = 1 (as of 02/14/15)   Social Determinants of Health   Financial Resource Strain: Not on file  Food Insecurity: Not on file  Transportation Needs: Not on file  Physical Activity: Not on file  Stress: Not on file  Social Connections: Not on file   No Known Allergies  Medications   Medications Prior to Admission  Medication Sig Dispense Refill Last Dose   amLODipine (NORVASC) 5 MG tablet Take 1 tablet (5 mg total) by mouth daily. 30 tablet 0    aspirin 81 MG chewable tablet Chew 81 mg by mouth daily.      atorvastatin (LIPITOR) 80 MG tablet Take 1 tablet (80 mg total) by mouth daily. 90 tablet 3    levETIRAcetam (KEPPRA) 500 MG tablet TAKE 1 TABLET BY MOUTH TWICE A DAY 180 tablet 3    metoprolol succinate (TOPROL XL) 25 MG 24 hr tablet Take 1 tablet (25 mg total) by mouth daily. 90 tablet 3    traZODone (DESYREL) 50 MG tablet Take 1 tablet by mouth as needed.        Vitals   Vitals:   09/27/22 0914 09/27/22 0930 09/27/22 1100 09/27/22 1233  BP:  (!) 141/92 (!) 168/88   Pulse: 69 71 68   Resp: 18 (!) 25 16   Temp:   98.9 F (37.2 C)   TempSrc:   Oral   SpO2: 94% 95% 96% 96%  Weight:   79.6 kg   Height:   5\' 11"  (1.803 m)      Body mass index is 24.48 kg/m.  Physical Exam   General: Lying comfortably in bed; in no acute distress.  CV: RRR. No peripheral edema.  Pulmonary: Symmetric Chest rise. Normal respiratory effort on room air Abdomen: Soft to touch, non-tender.  Ext: No cyanosis, edema, or deformity  Skin: No rash. Normal palpation of skin.   Musculoskeletal: R 5th finger deformity, flexed at PIPJ, R hand dorsum with   Neurologic Examination  Mental status/Cognition: Alert, oriented to self, place, month and year, good attention.  Speech/language: Fluent, comprehension intact, object naming intact, repetition intact.  Cranial nerves:   CN II Pupils equal and reactive to light, no VF deficits     CN III,IV,VI EOM intact, no gaze preference or deviation, no nystagmus    CN V normal sensation in V1, V2, and V3 segments bilaterally    CN VII no asymmetry, no nasolabial fold flattening    CN VIII Hard of hearing    CN IX & X normal palatal elevation, no uvular deviation    CN XI 5/5 head turn and 5/5  shoulder shrug bilaterally    CN XII midline tongue protrusion    Motor:  Muscle bulk: normal, tone normal, pronator drift - tremor - Mvmt Root Nerve  Muscle Right Left Comments  SA C5/6 Ax Deltoid 5/5 5/5   EF C5/6 Mc Biceps 5/5 5/5   EE C6/7/8 Rad Triceps 5/5 5/5   WF C6/7 Med FCR 5/5 5/5   WE C7/8 PIN ECU 5/5 5/5   F Ab C8/T1 U ADM/FDI 5/5 5/5   HF L1/2/3 Fem Illopsoas 5/5 5/5   KE L2/3/4 Fem Quad 5/5 5/5   DF L4/5 D Peron Tib Ant 5/5 5/5   PF S1/2 Tibial Grc/Sol 5/5 5/5    Reflexes:  Right Left Comments  Pectoralis      Biceps (C5/6)     Brachioradialis (C5/6)      Triceps (C6/7)      Patellar (L3/4)      Achilles (S1)      Hoffman      Plantar     Jaw jerk    Sensation:  Light touch Intact   Pin prick    Temperature    Vibration   Proprioception    Coordination/Complex Motor:  - Finger to Nose ataxic on the L  - Heel to shin ataxic on the L - Gait: Deferred   Labs   CBC:  Recent Labs  Lab 09/26/22 1305 09/27/22 1304  WBC 10.9* 10.1  NEUTROABS 9.7*  --   HGB 13.6 14.3  HCT 38.7* 41.9  MCV 87.0 86.6  PLT 338 365    Basic Metabolic Panel:  Lab Results  Component Value Date   NA 129 (L) 09/27/2022   K 3.4 (L) 09/27/2022   CO2 21 (L) 09/27/2022   GLUCOSE 104 (H) 09/27/2022   BUN 8 09/27/2022   CREATININE 0.68 09/27/2022   CALCIUM 9.0 09/27/2022   GFRNONAA >60 09/27/2022   GFRAA 103 05/01/2018   Lipid Panel:  Lab Results  Component Value Date   LDLCALC 61 03/24/2021   HgbA1c:  Lab Results  Component Value Date   HGBA1C 5.4 09/27/2022   Urine Drug Screen:     Component Value Date/Time   LABOPIA NONE DETECTED 09/26/2022 1337    COCAINSCRNUR NONE DETECTED 09/26/2022 1337   LABBENZ NONE DETECTED 09/26/2022 1337   AMPHETMU NONE DETECTED 09/26/2022 1337   THCU POSITIVE (A) 09/26/2022 1337   LABBARB NONE DETECTED 09/26/2022 1337    Alcohol Level     Component Value Date/Time   ETH <10 09/26/2022 1305    CT Head without contrast: IMPRESSION: 1. No acute intracranial findings. 2. Chronic microvascular ischemic change and cerebral volume loss.  CT angio Head and Neck with contrast: Pending   MRI Brain Pending    Impression   Mr. Marques Ericson is a 77 year old male with a past medical history of hypertension, mild aortic stenosis, coronary artery calcifications, status post bilateral total knee arthroplasty, and seizures (last seizure in 2018) who presented to the ED for incoordination of BLE, dizziness, and N/V most concerning for possible cerebellar stroke.   Etiology is pending further work up.   Recommendations  - Frequent Neuro checks per stroke unit protocol - Recommend brain imaging with MRI Brain without contrast - Recommend Vascular imaging with CT angio of head and neck  - Recommend obtaining TTE - Recommend obtaining Lipid panel with LDL - Please add adjunct lipid lowering medication to statin if LDL > 70 - Antithrombotic - Aspirin 81mg  daily at home. Start  plavix for 3 weeks - Recommend DVT ppx - SBP goal - permissive hypertension first 24 h < 220/110. Held home meds.  - Recommend Telemetry monitoring for arrythmia - Recommend bedside swallow screen prior to PO intake. - Stroke education booklet - Recommend PT/OT/SLP consult ______________________________________________________________________  Marolyn Haller, MD PGY-3 Internal Medicine Resident  Pager [phone]  I have seen the patient reviewed the note of Dr. Montez Morita.  The patient initially had acute onset vertigo with nausea and vomiting which persisted throughout most of the day yesterday.  On exam today, he has left arm and leg  ataxia.  Though he describes bilateral lower extremity symptoms, I think it is more that he was just off balance and unable to use his "legs."  Balance was by far his major complaint.  My strong suspicion is that he has had a small left cerebellar stroke and would favor admission for workup of such.  Workup as above.   Thank you for the opportunity to take part in the care of this patient. If you have any further questions, please contact the neurology consultation attending.  Signed,  Ritta Slot, MD [facility] [phone]  If 7pm- 7am, please page neurology on call as listed in AMION.

## 2022-09-27 NOTE — ED Notes (Signed)
ED TO INPATIENT HANDOFF REPORT  ED Nurse Name and Phone #:  Lindie Spruce [phone]   S Name/Age/Gender Justin Sutton 77 y.o. male Room/Bed: DB014/DB014  Code Status   Code Status: Prior  Home/SNF/Other Home Patient oriented to: self, place, time, and situation Is this baseline? Yes   Triage Complete: Triage complete  Chief Complaint Acute focal neurological deficit [R29.818]  Triage Note Patient here POV from Home with Family.  Woke up with no Symptoms. Stated at 0400 he became dizzy. Since then he became progressively generally weak and tingling everywhere.   No Pain. No SOB. Dizziness has since subsided. No Unilateral Numbness or Tingling.   NAD Noted during Triage. A&Ox4. GCS 15. BIB Wheelchair   Allergies No Known Allergies  Level of Care/Admitting Diagnosis ED Disposition     ED Disposition  Admit   Condition  --   Comment  Hospital Area: [facility] [[id number]]  Level of Care: Telemetry Medical [104]  Interfacility transfer: Yes  May place patient in observation at [facility] or [facility] if equivalent level of care is available:: No  Covid Evaluation: Symptomatic Person Under Investigation (PUI) or recent exposure (last 10 days) *Testing Required*  Diagnosis: Acute focal neurological deficit [730320]  Admitting Physician: Synetta Fail [[id number]]  Attending Physician: Synetta Fail [[id number]]          B Medical/Surgery History Past Medical History:  Diagnosis Date   Arthritis    Chronic cough    Essential hypertension 02/14/2015   GERD (gastroesophageal reflux disease)    H/O asbestos exposure    Hearing loss    Heart murmur    Hyperlipidemia    Hypertension    Nocturia    Numbness    comes and goes per right hand    OSA (obstructive sleep apnea)    Thoracic aortic aneurysm without rupture (HCC) 02/14/2015   4.1 cm 01/2015   Past Surgical History:  Procedure Laterality Date   CATARACT EXTRACTION  2011   bilat     HAND SURGERY Right 1983   HERNIA REPAIR  2011   TONSILLECTOMY     TOTAL KNEE ARTHROPLASTY Left 2011   TOTAL KNEE ARTHROPLASTY Right 04/14/2015   Procedure: RIGHT TOTAL KNEE ARTHROPLASTY;  Surgeon: Ollen Gross, MD;  Location: [facility];  Service: Orthopedics;  Laterality: Right;     A IV Location/Drains/Wounds Patient Lines/Drains/Airways Status     Active Line/Drains/Airways     Name Placement date Placement time Site Days   Peripheral IV 09/26/22 20 G Anterior;Distal;Right;Upper Arm 09/26/22  1320  Arm  1   Urethral Catheter angela barlow,rn Non-latex 16 Fr. 09/26/22  1430  Non-latex  1            Intake/Output Last 24 hours  Intake/Output Summary (Last 24 hours) at 09/27/2022 0940 Last data filed at 09/27/2022 1027 Gross per 24 hour  Intake --  Output 3000 ml  Net -3000 ml    Labs/Imaging Results for orders placed or performed during the hospital encounter of 09/26/22 (from the past 48 hour(s))  CBG monitoring, ED     Status: Abnormal   Collection Time: 09/26/22 12:49 PM  Result Value Ref Range   Glucose-Capillary 138 (H) 70 - 99 mg/dL    Comment: Glucose reference range applies only to samples taken after fasting for at least 8 hours.  Comprehensive metabolic panel     Status: Abnormal   Collection Time: 09/26/22  1:05 PM  Result Value Ref Range  Sodium 131 (L) 135 - 145 mmol/L   Potassium 3.6 3.5 - 5.1 mmol/L   Chloride 98 98 - 111 mmol/L   CO2 23 22 - 32 mmol/L   Glucose, Bld 133 (H) 70 - 99 mg/dL    Comment: Glucose reference range applies only to samples taken after fasting for at least 8 hours.   BUN 8 8 - 23 mg/dL   Creatinine, Ser 6.30 0.61 - 1.24 mg/dL   Calcium 9.2 8.9 - 16.0 mg/dL   Total Protein 7.4 6.5 - 8.1 g/dL   Albumin 4.7 3.5 - 5.0 g/dL   AST 21 15 - 41 U/L   ALT 20 0 - 44 U/L   Alkaline Phosphatase 68 38 - 126 U/L   Total Bilirubin 0.7 0.3 - 1.2 mg/dL   GFR, Estimated >10 >93 mL/min    Comment: (NOTE) Calculated using the CKD-EPI  Creatinine Equation (2021)    Anion gap 10 5 - 15    Comment: Performed at [facility], [address] [place], [place] [address]  Ethanol     Status: None   Collection Time: 09/26/22  1:05 PM  Result Value Ref Range   Alcohol, Ethyl (B) <10 <10 mg/dL    Comment: (NOTE) Lowest detectable limit for serum alcohol is 10 mg/dL.  For medical purposes only. Performed at [facility], [address] [place], [place] [address]   Magnesium     Status: Abnormal   Collection Time: 09/26/22  1:05 PM  Result Value Ref Range   Magnesium 1.6 (L) 1.7 - 2.4 mg/dL    Comment: Performed at [facility], [address], [place], [place] [address]  CBC with Differential     Status: Abnormal   Collection Time: 09/26/22  1:05 PM  Result Value Ref Range   WBC 10.9 (H) 4.0 - 10.5 K/uL   RBC 4.45 4.22 - 5.81 MIL/uL   Hemoglobin 13.6 13.0 - 17.0 g/dL   HCT 62.3 (L) 76.2 - 83.1 %   MCV 87.0 80.0 - 100.0 fL   MCH 30.6 26.0 - 34.0 pg   MCHC 35.1 30.0 - 36.0 g/dL   RDW 51.7 61.6 - 07.3 %   Platelets 338 150 - 400 K/uL   nRBC 0.0 0.0 - 0.2 %   Neutrophils Relative % 89 %   Neutro Abs 9.7 (H) 1.7 - 7.7 K/uL   Lymphocytes Relative 6 %   Lymphs Abs 0.7 0.7 - 4.0 K/uL   Monocytes Relative 4 %   Monocytes Absolute 0.5 0.1 - 1.0 K/uL   Eosinophils Relative 0 %   Eosinophils Absolute 0.0 0.0 - 0.5 K/uL   Basophils Relative 0 %   Basophils Absolute 0.0 0.0 - 0.1 K/uL   Immature Granulocytes 1 %   Abs Immature Granulocytes 0.05 0.00 - 0.07 K/uL    Comment: Performed at [facility], [address] [place], [place] [address]  Troponin I (High Sensitivity)     Status: None   Collection Time: 09/26/22  1:05 PM  Result Value Ref Range   Troponin I (High Sensitivity) 14 <18 ng/L    Comment: (NOTE) Elevated high sensitivity troponin I (hsTnI) values and significant  changes across serial measurements may suggest ACS but many  other  chronic and acute conditions are known to elevate hsTnI results.  Refer to the "Links" section for chest pain algorithms and additional  guidance. Performed at [facility], [address] [place], [place] [address]  Brain natriuretic peptide     Status: Abnormal   Collection Time: 09/26/22  1:05 PM  Result Value Ref Range   B Natriuretic Peptide 204.3 (H) 0.0 - 100.0 pg/mL    Comment: Performed at [facility], [address] [place], [place] [address]  Lactic acid, plasma     Status: None   Collection Time: 09/26/22  1:06 PM  Result Value Ref Range   Lactic Acid, Venous 1.0 0.5 - 1.9 mmol/L    Comment: Performed at [facility], [address] [place], [place] [address]  Urinalysis, w/ Reflex to Culture (Infection Suspected) -Urine, Clean Catch     Status: None   Collection Time: 09/26/22  1:37 PM  Result Value Ref Range   Specimen Source URINE, CLEAN CATCH    Color, Urine YELLOW YELLOW   APPearance CLEAR CLEAR   Specific Gravity, Urine 1.009 1.005 - 1.030   pH 8.0 5.0 - 8.0   Glucose, UA NEGATIVE NEGATIVE mg/dL   Hgb urine dipstick NEGATIVE NEGATIVE   Bilirubin Urine NEGATIVE NEGATIVE   Ketones, ur NEGATIVE NEGATIVE mg/dL   Protein, ur NEGATIVE NEGATIVE mg/dL   Nitrite NEGATIVE NEGATIVE   Leukocytes,Ua NEGATIVE NEGATIVE   RBC / HPF 0-5 0 - 5 RBC/hpf   WBC, UA 0-5 0 - 5 WBC/hpf    Comment:        Reflex urine culture not performed if WBC <=10, OR if Squamous epithelial cells >5. If Squamous epithelial cells >5 suggest recollection.    Bacteria, UA NONE SEEN NONE SEEN   Squamous Epithelial / HPF 0-5 0 - 5 /HPF   Mucus PRESENT     Comment: Performed at [facility], [address] [place], [place] [address]  Urine rapid drug screen (hosp performed)     Status: Abnormal   Collection Time: 09/26/22  1:37 PM  Result Value Ref Range   Opiates NONE DETECTED NONE DETECTED   Cocaine  NONE DETECTED NONE DETECTED   Benzodiazepines NONE DETECTED NONE DETECTED   Amphetamines NONE DETECTED NONE DETECTED   Tetrahydrocannabinol POSITIVE (A) NONE DETECTED   Barbiturates NONE DETECTED NONE DETECTED    Comment: (NOTE) DRUG SCREEN FOR MEDICAL PURPOSES ONLY.  IF CONFIRMATION IS NEEDED FOR ANY PURPOSE, NOTIFY LAB WITHIN 5 DAYS.  LOWEST DETECTABLE LIMITS FOR URINE DRUG SCREEN Drug Class                     Cutoff (ng/mL) Amphetamine and metabolites    1000 Barbiturate and metabolites    200 Benzodiazepine                 200 Opiates and metabolites        300 Cocaine and metabolites        300 THC                            50 Performed at [facility], [address] [place], [place] [address]   Resp panel by RT-PCR (RSV, Flu A&B, Covid) Anterior Nasal Swab     Status: None   Collection Time: 09/26/22  2:02 PM   Specimen: Anterior Nasal Swab  Result Value Ref Range   SARS Coronavirus 2 by RT PCR NEGATIVE NEGATIVE    Comment: (NOTE) SARS-CoV-2 target nucleic acids are NOT DETECTED.  The SARS-CoV-2 RNA is generally detectable in upper respiratory specimens during the acute phase of infection. The lowest concentration of  SARS-CoV-2 viral copies this assay can detect is 138 copies/mL. A negative result does not preclude SARS-Cov-2 infection and should not be used as the sole basis for treatment or other patient management decisions. A negative result may occur with  improper specimen collection/handling, submission of specimen other than nasopharyngeal swab, presence of viral mutation(s) within the areas targeted by this assay, and inadequate number of viral copies(<138 copies/mL). A negative result must be combined with clinical observations, patient history, and epidemiological information. The expected result is Negative.  Fact Sheet for Patients:  BloggerCourse.com  Fact Sheet for Healthcare Providers:   SeriousBroker.it  This test is no t yet approved or cleared by the [place] FDA and  has been authorized for detection and/or diagnosis of SARS-CoV-2 by FDA under an Emergency Use Authorization (EUA). This EUA will remain  in effect (meaning this test can be used) for the duration of the COVID-19 declaration under Section 564(b)(1) of the Act, 21 U.S.C.section 360bbb-3(b)(1), unless the authorization is terminated  or revoked sooner.       Influenza A by PCR NEGATIVE NEGATIVE   Influenza B by PCR NEGATIVE NEGATIVE    Comment: (NOTE) The Xpert Xpress SARS-CoV-2/FLU/RSV plus assay is intended as an aid in the diagnosis of influenza from Nasopharyngeal swab specimens and should not be used as a sole basis for treatment. Nasal washings and aspirates are unacceptable for Xpert Xpress SARS-CoV-2/FLU/RSV testing.  Fact Sheet for Patients: BloggerCourse.com  Fact Sheet for Healthcare Providers: SeriousBroker.it  This test is not yet approved or cleared by the [place] FDA and has been authorized for detection and/or diagnosis of SARS-CoV-2 by FDA under an Emergency Use Authorization (EUA). This EUA will remain in effect (meaning this test can be used) for the duration of the COVID-19 declaration under Section 564(b)(1) of the Act, 21 U.S.C. section 360bbb-3(b)(1), unless the authorization is terminated or revoked.     Resp Syncytial Virus by PCR NEGATIVE NEGATIVE    Comment: (NOTE) Fact Sheet for Patients: BloggerCourse.com  Fact Sheet for Healthcare Providers: SeriousBroker.it  This test is not yet approved or cleared by the [place] FDA and has been authorized for detection and/or diagnosis of SARS-CoV-2 by FDA under an Emergency Use Authorization (EUA). This EUA will remain in effect (meaning this test can be used) for the duration of  the COVID-19 declaration under Section 564(b)(1) of the Act, 21 U.S.C. section 360bbb-3(b)(1), unless the authorization is terminated or revoked.  Performed at [facility], [address] [place], [place] [address]   Culture, blood (routine x 2)     Status: None (Preliminary result)   Collection Time: 09/26/22  2:30 PM   Specimen: BLOOD  Result Value Ref Range   Specimen Description      BLOOD RIGHT ANTECUBITAL Performed at [facility], [address] [place], [place] [address]    Special Requests      BOTTLES DRAWN AEROBIC AND ANAEROBIC Blood Culture adequate volume Performed at [facility], [address] [place], [place] [address]    Culture      NO GROWTH < 12 HOURS Performed at [facility], [address], [place], [place] [address]    Report Status PENDING   Culture, blood (routine x 2)     Status: None (Preliminary result)   Collection Time: 09/26/22  2:50 PM   Specimen: BLOOD RIGHT HAND  Result Value Ref Range   Specimen Description      BLOOD RIGHT HAND  Performed at [facility], [address] [place], [place] [address]    Special Requests      BOTTLES DRAWN AEROBIC ONLY Blood Culture adequate volume Performed at [facility], [address], [place], [place] [address]    Culture      NO GROWTH < 12 HOURS Performed at [facility], [address], [place], [place] [address]    Report Status PENDING   Troponin I (High Sensitivity)     Status: None   Collection Time: 09/26/22  3:35 PM  Result Value Ref Range   Troponin I (High Sensitivity) 13 <18 ng/L    Comment: (NOTE) Elevated high sensitivity troponin I (hsTnI) values and significant  changes across serial measurements may suggest ACS but many other  chronic and acute conditions are known to elevate hsTnI results.  Refer to the "Links" section for chest pain algorithms and additional   guidance. Performed at [facility], [address] [place], [place] [address]    CT Head Wo Contrast  Result Date: 09/26/2022 CLINICAL DATA:  Mental status change, unknown cause EXAM: CT HEAD WITHOUT CONTRAST TECHNIQUE: Contiguous axial images were obtained from the base of the skull through the vertex without intravenous contrast. RADIATION DOSE REDUCTION: This exam was performed according to the departmental dose-optimization program which includes automated exposure control, adjustment of the mA and/or kV according to patient size and/or use of iterative reconstruction technique. COMPARISON:  02/16/2017 FINDINGS: Brain: No evidence of acute infarction, hemorrhage, hydrocephalus, extra-axial collection or mass lesion/mass effect. Extensive low-density changes within the periventricular and subcortical white matter most compatible with chronic microvascular ischemic change. Mild diffuse cerebral volume loss. Vascular: Atherosclerotic calcifications involving the large vessels of the skull base. No unexpected hyperdense vessel. Skull: Normal. Negative for fracture or focal lesion. Sinuses/Orbits: No acute finding. Other: None. IMPRESSION: 1. No acute intracranial findings. 2. Chronic microvascular ischemic change and cerebral volume loss. Electronically Signed   By: Duanne Guess D.O.   On: 09/26/2022 13:49   DG Chest Portable 1 View  Result Date: 09/26/2022 CLINICAL DATA:  Weakness, dizziness EXAM: PORTABLE CHEST 1 VIEW COMPARISON:  03/23/2019 FINDINGS: The heart size and mediastinal contours are within normal limits. Diffuse bilateral interstitial pulmonary opacity. The visualized skeletal structures are unremarkable. IMPRESSION: Diffuse bilateral interstitial pulmonary opacity, consistent with edema or atypical/viral infection. No focal airspace opacity. Electronically Signed   By: Jearld Lesch M.D.   On: 09/26/2022 13:36    Pending Labs Unresulted Labs (From admission,  onward)    None       Vitals/Pain Today's Vitals   09/27/22 0830 09/27/22 0900 09/27/22 0914 09/27/22 0930  BP: 139/87 (!) 126/91  (!) 141/92  Pulse: 67 88 69 71  Resp: (!) 22 (!) 26 18 (!) 25  Temp:      TempSrc:      SpO2: 96% 94% 94% 95%  Weight:      Height:      PainSc:        Isolation Precautions No active isolations  Medications Medications  amLODipine (NORVASC) tablet 5 mg (5 mg Oral Given 09/27/22 0922)  aspirin chewable tablet 81 mg (81 mg Oral Given 09/27/22 0922)  atorvastatin (LIPITOR) tablet 80 mg (80 mg Oral Given 09/27/22 0922)  levETIRAcetam (KEPPRA) tablet 500 mg (500 mg Oral Given 09/27/22 0921)  metoprolol succinate (TOPROL-XL) 24 hr tablet 25 mg (25 mg Oral Given 09/27/22 0922)  cefTRIAXone (ROCEPHIN) 1 g in sodium chloride 0.9 %  100 mL IVPB (has no administration in time range)  azithromycin (ZITHROMAX) 500 mg in sodium chloride 0.9 % 250 mL IVPB (has no administration in time range)  traZODone (DESYREL) tablet 50 mg (has no administration in time range)  lactated ringers bolus 1,000 mL (0 mLs Intravenous Stopped 09/26/22 2001)  cefTRIAXone (ROCEPHIN) 1 g in sodium chloride 0.9 % 100 mL IVPB (0 g Intravenous Stopped 09/26/22 2001)  azithromycin (ZITHROMAX) 500 mg in sodium chloride 0.9 % 250 mL IVPB (0 mg Intravenous Stopped 09/26/22 2019)  ondansetron (ZOFRAN) injection 4 mg (4 mg Intravenous Given 09/26/22 1500)  LORazepam (ATIVAN) tablet 1 mg (1 mg Oral Given 09/26/22 2118)  ondansetron (ZOFRAN) injection 4 mg (4 mg Intravenous Given 09/26/22 2114)  acetaminophen (TYLENOL) tablet 650 mg (650 mg Oral Given 09/27/22 0654)    Mobility walks with person assist     Focused Assessments Neuro Assessment Handoff:  Swallow screen pass? Yes  Cardiac Rhythm: Normal sinus rhythm NIH Stroke Scale  Dizziness Present: Yes Headache Present: No Interval: Initial Level of Consciousness (1a.)   : Alert, keenly responsive LOC Questions (1b. )   : Answers both  questions correctly LOC Commands (1c. )   : Performs both tasks correctly Best Gaze (2. )  : Normal Visual (3. )  : No visual loss Facial Palsy (4. )    : Normal symmetrical movements Motor Arm, Left (5a. )   : No drift Motor Arm, Right (5b. ) : No drift Motor Leg, Left (6a. )  : No drift Motor Leg, Right (6b. ) : No drift Limb Ataxia (7. ): Absent Sensory (8. )  : Normal, no sensory loss Best Language (9. )  : No aphasia Dysarthria (10. ): Normal Extinction/Inattention (11.)   : No Abnormality Complete NIHSS TOTAL: 0     Neuro Assessment: Exceptions to WDL Neuro Checks:   Initial (09/26/22 1831)  Has TPA been given? No If patient is a Neuro Trauma and patient is going to OR before floor call report to 4N Charge nurse: [phone] or [phone]   R Recommendations: See Admitting Provider Note  Report given to:   Additional Notes:

## 2022-09-27 NOTE — Hospital Course (Signed)
Mr. Kenyen Candy is a [age] year old male with a past medical history of hypertension, mild aortic stenosis, coronary artery calcifications, status post bilateral total knee arthroplasty, and seizures (last seizure in 2018) who presented to the ED for numbness and tingling as well as weakness in his bilateral lower extremities.   History is provided by patient as well as his spouse who is at bedside. Patient states that he woke up on the day of admission with numbness, tingling, and weakness in his legs.  Patient was unable to ambulate with his assistive device (a cane) and he also had some nausea and dizziness during this time.   His spouse discussed his symptoms with a family friend who stated he could be having a stroke and recommended he go to the hospital for further evaluation.  Patient states he has never had symptoms like this in the past and notes that the numbness, tingling, and weakness was relegated to below his knees.  He does state that he has had bilateral total knee arthroplasties, but notes no physical limitations from these. He states that his symptoms persisted throughout the day but did improve once he was transferred from [facility] and at present are completely resolved.    In the ED, patient noted to have hyponatremia with sodium 131, potassium 3.6, no leukocytosis, magnesium of 1.6, and CT head without contrast notable for chronic microvascular ischemic changes and cerebral volume loss, without acute findings.

## 2022-09-27 NOTE — ED Notes (Signed)
Thomas at [facility] will send transport. Bed Ready at [facility] Room# 13.-ABB(NS)

## 2022-09-27 NOTE — ED Notes (Signed)
Pt sleeping in room, not woken at this time.

## 2022-09-27 NOTE — Progress Notes (Signed)
Called to make team aware of patient admitted to 3w-13. Instructed Dr. Robb Matar will be accepting, awaiting orders.  Patient oriented to room and call bell placed within reach, bedrails up x2.  See EPIC for full assessment. BP (!) 168/88 (BP Location: Left Arm)   Pulse 68   Temp 98.9 F (37.2 C) (Oral)   Resp 16   Ht 5\' 11"  (1.803 m)   Wt 79.6 kg   SpO2 96%   BMI 24.48 kg/m

## 2022-09-27 NOTE — Plan of Care (Signed)
  Problem: Ischemic Stroke/TIA Tissue Perfusion: Goal: Complications of ischemic stroke/TIA will be minimized Outcome: Progressing   Problem: Coping: Goal: Will verbalize positive feelings about self Outcome: Progressing Goal: Will identify appropriate support needs Outcome: Progressing   Problem: Self-Care: Goal: Ability to participate in self-care as condition permits will improve Outcome: Progressing Goal: Verbalization of feelings and concerns over difficulty with self-care will improve Outcome: Progressing   Problem: Nutrition: Goal: Risk of aspiration will decrease Outcome: Completed/Met Goal: Dietary intake will improve Outcome: Completed/Met

## 2022-09-28 ENCOUNTER — Observation Stay (HOSPITAL_BASED_OUTPATIENT_CLINIC_OR_DEPARTMENT_OTHER): Payer: Medicare Other

## 2022-09-28 ENCOUNTER — Other Ambulatory Visit (HOSPITAL_COMMUNITY): Payer: Self-pay

## 2022-09-28 DIAGNOSIS — R29818 Other symptoms and signs involving the nervous system: Secondary | ICD-10-CM | POA: Diagnosis not present

## 2022-09-28 DIAGNOSIS — I6389 Other cerebral infarction: Secondary | ICD-10-CM

## 2022-09-28 DIAGNOSIS — I639 Cerebral infarction, unspecified: Secondary | ICD-10-CM

## 2022-09-28 LAB — CBC
HCT: 43.2 % (ref 39.0–52.0)
Hemoglobin: 14.7 g/dL (ref 13.0–17.0)
MCH: 30.7 pg (ref 26.0–34.0)
MCHC: 34 g/dL (ref 30.0–36.0)
MCV: 90.2 fL (ref 80.0–100.0)
Platelets: 329 10*3/uL (ref 150–400)
RBC: 4.79 MIL/uL (ref 4.22–5.81)
RDW: 12.3 % (ref 11.5–15.5)
WBC: 10.1 10*3/uL (ref 4.0–10.5)
nRBC: 0 % (ref 0.0–0.2)

## 2022-09-28 LAB — LIPID PANEL
Cholesterol: 148 mg/dL (ref 0–200)
HDL: 66 mg/dL (ref >40)
LDL Cholesterol: 66 mg/dL (ref 0–99)
Total CHOL/HDL Ratio: 2.2 RATIO
Triglycerides: 82 mg/dL (ref <150)
VLDL: 16 mg/dL (ref 0–40)

## 2022-09-28 LAB — BASIC METABOLIC PANEL
Anion gap: 8 (ref 5–15)
BUN: 10 mg/dL (ref 8–23)
CO2: 24 mmol/L (ref 22–32)
Calcium: 8.7 mg/dL — ABNORMAL LOW (ref 8.9–10.3)
Chloride: 101 mmol/L (ref 98–111)
Creatinine, Ser: 0.75 mg/dL (ref 0.61–1.24)
GFR, Estimated: >60 mL/min (ref >60)
Glucose, Bld: 106 mg/dL — ABNORMAL HIGH (ref 70–99)
Potassium: 3.7 mmol/L (ref 3.5–5.1)
Sodium: 133 mmol/L — ABNORMAL LOW (ref 135–145)

## 2022-09-28 LAB — ECHOCARDIOGRAM COMPLETE
AR max vel: 1.86 cm2
AV Area VTI: 2.03 cm2
AV Area mean vel: 1.89 cm2
AV Mean grad: 12 mmHg
AV Peak grad: 20.4 mmHg
Ao pk vel: 2.26 m/s
Calc EF: 62.7 %
Height: 71 in
MV M vel: 2.45 m/s
MV Peak grad: 24 mmHg
S' Lateral: 2.4 cm
Single Plane A2C EF: 57.7 %
Single Plane A4C EF: 65.3 %
Weight: 2807.78 oz

## 2022-09-28 LAB — PROCALCITONIN: Procalcitonin: <0.1 ng/mL

## 2022-09-28 LAB — CULTURE, BLOOD (ROUTINE X 2): Special Requests: ADEQUATE

## 2022-09-28 LAB — LEVETIRACETAM LEVEL: Levetiracetam Lvl: 9.1 ug/mL — ABNORMAL LOW (ref 10.0–40.0)

## 2022-09-28 MED ORDER — ATORVASTATIN CALCIUM 80 MG PO TABS
80.0000 mg | ORAL_TABLET | Freq: Every day | ORAL | 3 refills | Status: DC
Start: 2022-09-28 — End: 2023-04-04
  Filled 2022-09-28: qty 90, 90d supply, fill #0

## 2022-09-28 MED ORDER — METOPROLOL SUCCINATE ER 25 MG PO TB24
25.0000 mg | ORAL_TABLET | Freq: Every day | ORAL | 3 refills | Status: DC
Start: 2022-09-30 — End: 2023-04-04
  Filled 2022-09-28: qty 90, 90d supply, fill #0

## 2022-09-28 MED ORDER — AMLODIPINE BESYLATE 5 MG PO TABS
5.0000 mg | ORAL_TABLET | Freq: Every day | ORAL | 3 refills | Status: AC
  Filled 2022-09-28: qty 30, 30d supply, fill #0

## 2022-09-28 MED ORDER — ASPIRIN 81 MG PO TBEC: 81.0000 mg | DELAYED_RELEASE_TABLET | Freq: Every day | ORAL | 0 refills | Status: DC

## 2022-09-28 MED ORDER — CLOPIDOGREL BISULFATE 75 MG PO TABS
75.0000 mg | ORAL_TABLET | Freq: Every day | ORAL | 11 refills | Status: AC
  Filled 2022-09-28: qty 30, 30d supply, fill #0

## 2022-09-28 NOTE — Progress Notes (Signed)
  Echocardiogram 2D Echocardiogram has been performed.  Milda Smart 09/28/2022, 10:02 AM

## 2022-09-28 NOTE — TOC Transition Note (Signed)
Transition of Care [facility]) - CM/SW Discharge Note   Patient Details  Name: Justin Sutton MRN: [id number] Date of Birth: 1945-06-13  Transition of Care [facility]) CM/SW Contact:  Kermit Balo, RN Phone Number: 09/28/2022, 2:48 PM   Clinical Narrative:    Pt is discharging home with self care.  No PT follow up.  Pt denies any issues with medications at home or transportation. Pt has transport home today.   Final next level of care: Home/Self Care Barriers to Discharge: No Barriers Identified   Patient Goals and CMS Choice      Discharge Placement                         Discharge Plan and Services Additional resources added to the After Visit Summary for                                       Social Determinants of Health (SDOH) Interventions SDOH Screenings   Food Insecurity: No Food Insecurity (09/27/2022)  Housing: Low Risk  (09/27/2022)  Transportation Needs: No Transportation Needs (09/27/2022)  Utilities: Not At Risk (09/27/2022)  Tobacco Use: Medium Risk (09/27/2022)     Readmission Risk Interventions     No data to display

## 2022-09-28 NOTE — Care Management Obs Status (Signed)
MEDICARE OBSERVATION STATUS NOTIFICATION   Patient Details  Name: Justin Sutton MRN: [id number] Date of Birth: [birth year]   Medicare Observation Status Notification Given:  Yes    Kermit Balo, RN 09/28/2022, 2:47 PM

## 2022-09-28 NOTE — Evaluation (Signed)
Physical Therapy Evaluation Patient Details Name: Justin Sutton MRN: [id number] DOB: Apr 17, 1945 Today's Date: 09/28/2022  History of Present Illness  Pt is a 77 y.o. M presents 09/26/2022 with dizziness, N/V, BLE incoordination. 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 No associated hemorrhage or mass effect. Significant PMH: arthritis, HTN, hearing loss, thoracic aortic aneurysm.   Clinical Impression  PTA pt reports independence with functional mobility and ADL's, using a SPC PRN. Pt lives at home with spouse and grandson. Today, pt able to amb 150' with no AD and min G, completes 4 stairs with alternating pattern and R handrail, reports they feel close to their baseline. Pt has all acute PT needs met at this time, no further PT venues necessary. Thank you for the consult.     Recommendations for follow up therapy are one component of a multi-disciplinary discharge planning process, led by the attending physician.  Recommendations may be updated based on patient status, additional functional criteria and insurance authorization.  Follow Up Recommendations       Assistance Recommended at Discharge PRN  Patient can return home with the following  A little help with walking and/or transfers;Assist for transportation;Help with stairs or ramp for entrance    Equipment Recommendations None recommended by PT  Recommendations for Other Services       Functional Status Assessment Patient has had a recent decline in their functional status and demonstrates the ability to make significant improvements in function in a reasonable and predictable amount of time.     Precautions / Restrictions Precautions Precautions: Fall Restrictions Weight Bearing Restrictions: No      Mobility  Bed Mobility               General bed mobility comments: OOB upon entry. pt in recliner    Transfers Overall transfer level: Modified  independent Equipment used: None Transfers: Sit to/from Stand Sit to Stand: Modified independent (Device/Increase time)           General transfer comment: Adequate push-off to stand, able to demo sit<>stand without using UE for push    Ambulation/Gait Ambulation/Gait assistance: Min guard Gait Distance (Feet): 150 Feet Assistive device: None Gait Pattern/deviations: Step-through pattern, Narrow base of support Gait velocity: reduced Gait velocity interpretation: 1.31 - 2.62 ft/sec, indicative of limited community ambulator   General Gait Details: Amb with no AD, demos bilateral turning and no LOB throughout  Stairs Stairs: Yes Stairs assistance: Modified independent (Device/Increase time) Stair Management: One rail Right Number of Stairs: 4 General stair comments: Alternating pattern  Wheelchair Mobility    Modified Rankin (Stroke Patients Only) Modified Rankin (Stroke Patients Only) Pre-Morbid Rankin Score: No symptoms Modified Rankin: No significant disability     Balance Overall balance assessment: Mild deficits observed, not formally tested                                           Pertinent Vitals/Pain Pain Assessment Pain Assessment: No/denies pain    Home Living Family/patient expects to be discharged to:: Private residence Living Arrangements: Spouse/significant other;Children Available Help at Discharge: Family Type of Home: House Home Access: Stairs to enter Entrance Stairs-Rails: Right Entrance Stairs-Number of Steps: 2   Home Layout: One level Home Equipment: Cane - single point      Prior Function Prior Level of Function : Independent/Modified Independent  Mobility Comments: Uses SPC PRN       Hand Dominance        Extremity/Trunk Assessment   Upper Extremity Assessment Upper Extremity Assessment: Overall WFL for tasks assessed    Lower Extremity Assessment Lower Extremity Assessment: Overall  WFL for tasks assessed    Cervical / Trunk Assessment Cervical / Trunk Assessment: Kyphotic  Communication   Communication: No difficulties;HOH  Cognition Arousal/Alertness: Awake/alert Behavior During Therapy: WFL for tasks assessed/performed Overall Cognitive Status: Within Functional Limits for tasks assessed                                          General Comments General comments (skin integrity, edema, etc.): Pt donned pants in standing    Exercises     Assessment/Plan    PT Assessment Patient does not need any further PT services  PT Problem List         PT Treatment Interventions      PT Goals (Current goals can be found in the Care Plan section)  Acute Rehab PT Goals Patient Stated Goal: eager to return home PT Goal Formulation: With patient    Frequency       Co-evaluation               AM-PAC PT "6 Clicks" Mobility  Outcome Measure Help needed turning from your back to your side while in a flat bed without using bedrails?: None Help needed moving from lying on your back to sitting on the side of a flat bed without using bedrails?: None Help needed moving to and from a bed to a chair (including a wheelchair)?: None Help needed standing up from a chair using your arms (e.g., wheelchair or bedside chair)?: None Help needed to walk in hospital room?: A Little Help needed climbing 3-5 steps with a railing? : A Little 6 Click Score: 22    End of Session Equipment Utilized During Treatment: Gait belt Activity Tolerance: Patient tolerated treatment well Patient left: in chair;with call bell/phone within reach;with chair alarm set Nurse Communication: Mobility status PT Visit Diagnosis: Other symptoms and signs involving the nervous system (R29.898)    Time: 5409-8119 PT Time Calculation (min) (ACUTE ONLY): 15 min   Charges:   PT Evaluation $PT Eval Low Complexity: 1 Low          Hendricks Milo, SPT  Acute Rehabilitation  Services   Hendricks Milo 09/28/2022, 10:06 AM

## 2022-09-28 NOTE — Evaluation (Signed)
SLP Screen Note  Patient Details Name: Justin Sutton MRN: [id number] DOB: 10-22-1945   Cancelled treatment:       Reason Eval/Treat Not Completed: Other (comment);SLP screened, no needs identified, will sign off (RN reports pt without dysarthria at this time; no SLP needs identified)  [name], MS [facility] SLP Acute Rehab Services Office [phone]   [name] 09/28/2022, 12:15 PM

## 2022-09-28 NOTE — Progress Notes (Signed)
STROKE TEAM PROGRESS NOTE   SUBJECTIVE (INTERVAL HISTORY) His wife and daughter are at the bedside.  Overall his condition is completely resolved. He has no complains and eager to go home.    OBJECTIVE Temp:  [97.5 F (36.4 C)-98.4 F (36.9 C)] 97.7 F (36.5 C) (06/25 1345) Pulse Rate:  [70-101] 81 (06/25 1345) Cardiac Rhythm: Heart block (06/25 0710) Resp:  [16-20] 18 (06/25 1345) BP: (128-168)/(86-129) 143/101 (06/25 1345) SpO2:  [95 %-98 %] 97 % (06/25 1345)  Recent Labs  Lab 09/26/22 1249  GLUCAP 138*   Recent Labs  Lab 09/26/22 1305 09/27/22 1304 09/28/22 0341  NA 131* 129* 133*  K 3.6 3.4* 3.7  CL 98 98 101  CO2 23 21* 24  GLUCOSE 133* 104* 106*  BUN 8 8 10   CREATININE 0.64 0.68 0.75  CALCIUM 9.2 9.0 8.7*  MG 1.6*  --   --    Recent Labs  Lab 09/26/22 1305 09/27/22 1304  AST 21 27  ALT 20 24  ALKPHOS 68 69  BILITOT 0.7 0.9  PROT 7.4 7.2  ALBUMIN 4.7 4.2   Recent Labs  Lab 09/26/22 1305 09/27/22 1304 09/28/22 0341  WBC 10.9* 10.1 10.1  NEUTROABS 9.7*  --   --   HGB 13.6 14.3 14.7  HCT 38.7* 41.9 43.2  MCV 87.0 86.6 90.2  PLT 338 365 329   No results for input(s): "CKTOTAL", "CKMB", "CKMBINDEX", "TROPONINI" in the last 168 hours. No results for input(s): "LABPROT", "INR" in the last 72 hours. Recent Labs    09/26/22 1337  COLORURINE YELLOW  LABSPEC 1.009  PHURINE 8.0  GLUCOSEU NEGATIVE  HGBUR NEGATIVE  BILIRUBINUR NEGATIVE  KETONESUR NEGATIVE  PROTEINUR NEGATIVE  NITRITE NEGATIVE  LEUKOCYTESUR NEGATIVE       Component Value Date/Time   CHOL 148 09/28/2022 0341   CHOL 171 03/24/2021 0850   TRIG 82 09/28/2022 0341   HDL 66 09/28/2022 0341   HDL 97 03/24/2021 0850   CHOLHDL 2.2 09/28/2022 0341   VLDL 16 09/28/2022 0341   LDLCALC 66 09/28/2022 0341   LDLCALC 61 03/24/2021 0850   Lab Results  Component Value Date   HGBA1C 5.4 09/27/2022      Component Value Date/Time   LABOPIA NONE DETECTED 09/26/2022 1337   COCAINSCRNUR  NONE DETECTED 09/26/2022 1337   LABBENZ NONE DETECTED 09/26/2022 1337   AMPHETMU NONE DETECTED 09/26/2022 1337   THCU POSITIVE (A) 09/26/2022 1337   LABBARB NONE DETECTED 09/26/2022 1337    Recent Labs  Lab 09/26/22 1305  ETH <10    I have personally reviewed the radiological images below and agree with the radiology interpretations.  ECHOCARDIOGRAM COMPLETE  Result Date: 09/28/2022    ECHOCARDIOGRAM REPORT   Patient Name:   Justin Sutton Date of Exam: 09/28/2022 Medical Rec #:  [id number]        Height:       71.0 in Accession #:    [id number]       Weight:       175.5 lb Date of Birth:  1945/09/30        BSA:          1.995 m Patient Age:    77 years         BP:           140/104 mmHg Patient Gender: M                HR:  80 bpm. Exam Location:  Inpatient Procedure: 2D Echo, Cardiac Doppler and Color Doppler Indications:    TIA  History:        Patient has prior history of Echocardiogram examinations, most                 recent 01/04/2022. Aortic Valve Disease, Arrythmias:PVC; Risk                 Factors:Sleep Apnea, Hypertension and Dyslipidemia. TAA,                 asbestos exposure.  Sonographer:    Milda Smart Referring Phys: [id number] DAVID MANUEL ORTIZ  Sonographer Comments: Image acquisition challenging due to patient body habitus and Image acquisition challenging due to respiratory motion. IMPRESSIONS  1. Left ventricular ejection fraction, by estimation, is 55 to 60%. The left ventricle has normal function. The left ventricle has no regional wall motion abnormalities. There is mild concentric left ventricular hypertrophy. Left ventricular diastolic parameters are indeterminate.  2. Right ventricular systolic function is mildly reduced. The right ventricular size is normal.  3. The mitral valve is normal in structure. Mild mitral valve regurgitation. No evidence of mitral stenosis.  4. The aortic valve is normal in structure. Aortic valve regurgitation is trivial. Mild aortic  valve stenosis. Aortic valve area, by VTI measures 2.03 cm. Aortic valve mean gradient measures 12.0 mmHg. Aortic valve Vmax measures 2.26 m/s.  5. There is mild dilatation of the aortic root, measuring 38 mm. There is moderate dilatation of the ascending aorta, measuring 44 mm.  6. The inferior vena cava is normal in size with greater than 50% respiratory variability, suggesting right atrial pressure of 3 mmHg. FINDINGS  Left Ventricle: Left ventricular ejection fraction, by estimation, is 55 to 60%. The left ventricle has normal function. The left ventricle has no regional wall motion abnormalities. The left ventricular internal cavity size was normal in size. There is  mild concentric left ventricular hypertrophy. Left ventricular diastolic parameters are indeterminate. Right Ventricle: The right ventricular size is normal. No increase in right ventricular wall thickness. Right ventricular systolic function is mildly reduced. Left Atrium: Left atrial size was normal in size. Right Atrium: Right atrial size was normal in size. Pericardium: There is no evidence of pericardial effusion. Mitral Valve: The mitral valve is normal in structure. Mild mitral valve regurgitation. No evidence of mitral valve stenosis. Tricuspid Valve: The tricuspid valve is normal in structure. Tricuspid valve regurgitation is not demonstrated. No evidence of tricuspid stenosis. Aortic Valve: The aortic valve is normal in structure. Aortic valve regurgitation is trivial. Mild aortic stenosis is present. Aortic valve mean gradient measures 12.0 mmHg. Aortic valve peak gradient measures 20.4 mmHg. Aortic valve area, by VTI measures 2.03 cm. Pulmonic Valve: The pulmonic valve was normal in structure. Pulmonic valve regurgitation is not visualized. No evidence of pulmonic stenosis. Aorta: There is mild dilatation of the aortic root, measuring 38 mm. There is moderate dilatation of the ascending aorta, measuring 44 mm. Venous: The inferior  vena cava is normal in size with greater than 50% respiratory variability, suggesting right atrial pressure of 3 mmHg. IAS/Shunts: No atrial level shunt detected by color flow Doppler.  LEFT VENTRICLE PLAX 2D LVIDd:         3.70 cm     Diastology LVIDs:         2.40 cm     LV e' medial:  8.05 cm/s LV PW:  1.10 cm     LV e' lateral: 8.70 cm/s LV IVS:        1.10 cm LVOT diam:     2.30 cm LV SV:         81 LV SV Index:   41 LVOT Area:     4.15 cm  LV Volumes (MOD) LV vol d, MOD A2C: 86.9 ml LV vol d, MOD A4C: 90.5 ml LV vol s, MOD A2C: 36.8 ml LV vol s, MOD A4C: 31.4 ml LV SV MOD A2C:     50.1 ml LV SV MOD A4C:     90.5 ml LV SV MOD BP:      56.7 ml RIGHT VENTRICLE            IVC RV Basal diam:  3.80 cm    IVC diam: 1.10 cm RV Mid diam:    2.90 cm RV S prime:     7.72 cm/s TAPSE (M-mode): 1.5 cm LEFT ATRIUM             Index        RIGHT ATRIUM           Index LA diam:        4.10 cm 2.06 cm/m   RA Area:     14.00 cm LA Vol (A2C):   41.4 ml 20.76 ml/m  RA Volume:   29.50 ml  14.79 ml/m LA Vol (A4C):   33.8 ml 16.92 ml/m LA Biplane Vol: 41.8 ml 20.96 ml/m  AORTIC VALVE AV Area (Vmax):    1.86 cm AV Area (Vmean):   1.89 cm AV Area (VTI):     2.03 cm AV Vmax:           226.00 cm/s AV Vmean:          162.000 cm/s AV VTI:            0.402 m AV Peak Grad:      20.4 mmHg AV Mean Grad:      12.0 mmHg LVOT Vmax:         101.00 cm/s LVOT Vmean:        73.600 cm/s LVOT VTI:          0.196 m LVOT/AV VTI ratio: 0.49  AORTA Ao Root diam: 3.80 cm Ao Asc diam:  4.40 cm MR Peak grad: 24.0 mmHg MR Vmax:      245.00 cm/s SHUNTS                           Systemic VTI:  0.20 m                           Systemic Diam: 2.30 cm Kardie Tobb DO Electronically signed by Thomasene Ripple DO Signature Date/Time: 09/28/2022/11:59:08 AM    Final    MR BRAIN WO CONTRAST  Result Date: 09/28/2022 CLINICAL DATA:  Initial evaluation for neuro deficit, stroke. EXAM: MRI HEAD WITHOUT CONTRAST TECHNIQUE: Multiplanar, multiecho pulse  sequences of the brain and surrounding structures were obtained without intravenous contrast. COMPARISON:  Prior CTs from earlier the same day. FINDINGS: Brain: Cerebral volume within normal limits. Patchy T2/FLAIR hyperintensity involving the periventricular deep white matter both cerebral hemispheres as well as the pons, consistent with chronic small vessel ischemic disease, moderate to advanced in nature. Small remote left cerebellar infarct noted. Patchy small volume restricted diffusion seen involving the inferior left cerebellar hemisphere, consistent  with acute ischemic infarcts (series 2, image 11, 10, 9, 7). No associated hemorrhage or mass effect. No other evidence for acute or subacute ischemia. Gray-white matter differentiation otherwise maintained. No acute intracranial hemorrhage. Few scattered chronic micro hemorrhages noted at the left pons and left parietal region, likely small vessel related. No mass lesion, midline shift or mass effect. No hydrocephalus or extra-axial fluid collection. Pituitary gland suprasellar region within normal limits. Vascular: Major intracranial vascular flow voids are maintained. Skull and upper cervical spine: Craniocervical junction within normal limits. Degenerative changes noted about the dens and tectorial membrane. Bone marrow signal intensity normal. No scalp soft tissue abnormality. Sinuses/Orbits: Prior bilateral ocular lens replacement. Small right maxillary sinus retention cyst noted. Paranasal sinuses are otherwise clear. No significant mastoid effusion. Other: None. IMPRESSION: 1. Patchy small volume acute ischemic infarcts involving the inferior left cerebellar hemisphere. No associated hemorrhage or mass effect. 2. Underlying moderate to advanced chronic microvascular ischemic disease, within additional small remote left cerebellar infarct. Electronically Signed   By: Rise Mu M.D.   On: 09/28/2022 02:41   CT ANGIO HEAD NECK W WO CM  Result  Date: 09/27/2022 CLINICAL DATA:  Neuro deficit, acute, stroke suspected. Mental status change of unknown cause. EXAM: CT ANGIOGRAPHY HEAD AND NECK WITH AND WITHOUT CONTRAST TECHNIQUE: Multidetector CT imaging of the head and neck was performed using the standard protocol during bolus administration of intravenous contrast. Multiplanar CT image reconstructions and MIPs were obtained to evaluate the vascular anatomy. Carotid stenosis measurements (when applicable) are obtained utilizing NASCET criteria, using the distal internal carotid diameter as the denominator. RADIATION DOSE REDUCTION: This exam was performed according to the departmental dose-optimization program which includes automated exposure control, adjustment of the mA and/or kV according to patient size and/or use of iterative reconstruction technique. CONTRAST:  75mL OMNIPAQUE IOHEXOL 350 MG/ML SOLN COMPARISON:  Head CT yesterday. FINDINGS: CT HEAD FINDINGS Brain: Age related volume loss. Extensive chronic small-vessel ischemic changes of the cerebral hemispheric white matter. No visible acute infarction, mass lesion, hemorrhage, hydrocephalus or extra-axial collection. Vascular: There is atherosclerotic calcification of the major vessels at the base of the brain. Skull: Negative Sinuses/Orbits: Clear/normal Other: None Review of the MIP images confirms the above findings CTA NECK FINDINGS Aortic arch: Aortic atherosclerosis with soft and calcified plaque. Branching pattern is normal without origin stenosis. Right carotid system: Common carotid artery widely patent to the bifurcation. Calcified plaque at the carotid bifurcation and ICA bulb. No stenosis. Cervical ICA is tortuous but widely patent beyond that. Left carotid system: Common carotid artery widely patent to the bifurcation region. Calcified plaque at the distal common carotid artery, carotid bifurcation and ICA bulb but no stenosis. Cervical ICA is tortuous but widely patent beyond that.  Vertebral arteries: Calcified plaque at both vertebral artery origins. Stenosis estimated at 30% on both sides. Beyond the origins, the vessels are patent through the cervical region to the foramen magnum. Skeleton: Advanced cervical spondylosis. Other neck: No mass or lymphadenopathy. Upper chest: Emphysema in the upper lungs. No evidence of mass or pneumonia. Review of the MIP images confirms the above findings CTA HEAD FINDINGS Anterior circulation: Both internal carotid arteries are patent through the skull base and siphon regions. There is siphon atherosclerotic calcification with stenosis estimated at 30-50% on both sides. The anterior and middle cerebral vessels are patent. No large vessel occlusion or proximal stenosis. No aneurysm or vascular malformation. Posterior circulation: Both vertebral arteries are patent through the foramen magnum to the basilar. No  basilar artery stenosis. Posterior circulation branch vessels are patent. Primary anterior circulation supply to the posterior cerebral arteries. Venous sinuses: Patent and normal. Anatomic variants: None other significant. Review of the MIP images confirms the above findings IMPRESSION: 1. No acute large or medium vessel occlusion. 2. Aortic atherosclerosis. 3. Atherosclerotic disease at both carotid bifurcations but without stenosis. 4. Atherosclerotic disease at both vertebral artery origins with stenosis estimated at 30% on both sides. 5. Atherosclerotic disease in both carotid siphon regions with stenosis estimated at 30-50% on both sides. Aortic Atherosclerosis (ICD10-I70.0) and Emphysema (ICD10-J43.9). Electronically Signed   By: Paulina Fusi M.D.   On: 09/27/2022 18:19   VAS US CAROTID  Result Date: 09/27/2022 Carotid Arterial Duplex Study Patient Name:  DRYSTAN READER  Date of Exam:   09/27/2022 Medical Rec #: [id number]         Accession #:    [id number] Date of Birth: May 18, 1945         Patient Gender: M Patient Age:   60 years Exam  Location:  [facility] Procedure:      VAS US CAROTID Referring Phys: DAVID ORTIZ --------------------------------------------------------------------------------  Indications:       TIA. Risk Factors:      Hypertension, hyperlipidemia. Limitations        Today's exam was limited due to the high bifurcation of the                    carotid. Comparison Study:  No prior studies. Performing Technologist: Chanda Busing RVT  Examination Guidelines: A complete evaluation includes B-mode imaging, spectral Doppler, color Doppler, and power Doppler as needed of all accessible portions of each vessel. Bilateral testing is considered an integral part of a complete examination. Limited examinations for reoccurring indications may be performed as noted.  Right Carotid Findings: +----------+--------+--------+--------+-----------------------+--------+           PSV cm/sEDV cm/sStenosisPlaque Description     Comments +----------+--------+--------+--------+-----------------------+--------+ CCA Prox  55      14              smooth and heterogenous         +----------+--------+--------+--------+-----------------------+--------+ CCA Distal49      16              smooth and heterogenous         +----------+--------+--------+--------+-----------------------+--------+ ICA Prox  42      15              smooth and heterogenous         +----------+--------+--------+--------+-----------------------+--------+ ICA Mid   61      21                                              +----------+--------+--------+--------+-----------------------+--------+ ICA Distal60      22                                     tortuous +----------+--------+--------+--------+-----------------------+--------+ ECA       86      12                                              +----------+--------+--------+--------+-----------------------+--------+ +----------+--------+-------+--------+-------------------+  PSV cm/sEDV cmsDescribeArm Pressure (mmHG) +----------+--------+-------+--------+-------------------+ [id number]                                         +----------+--------+-------+--------+-------------------+ +---------+--------+--+--------+--+---------+ VertebralPSV cm/s42EDV cm/s14Antegrade +---------+--------+--+--------+--+---------+  Left Carotid Findings: +----------+--------+--------+--------+-----------------------+--------+           PSV cm/sEDV cm/sStenosisPlaque Description     Comments +----------+--------+--------+--------+-----------------------+--------+ CCA Prox  62      15              smooth and heterogenous         +----------+--------+--------+--------+-----------------------+--------+ CCA Distal37      14              smooth and heterogenous         +----------+--------+--------+--------+-----------------------+--------+ ICA Prox  39      16              smooth and heterogenous         +----------+--------+--------+--------+-----------------------+--------+ ICA Mid   68      22                                     tortuous +----------+--------+--------+--------+-----------------------+--------+ ICA Distal88      22                                     tortuous +----------+--------+--------+--------+-----------------------+--------+ ECA       71      11                                              +----------+--------+--------+--------+-----------------------+--------+ +----------+--------+--------+--------+-------------------+           PSV cm/sEDV cm/sDescribeArm Pressure (mmHG) +----------+--------+--------+--------+-------------------+ [id number]                                          +----------+--------+--------+--------+-------------------+ +---------+--------+--+--------+--+---------+ VertebralPSV cm/s24EDV cm/s17Antegrade +---------+--------+--+--------+--+---------+   Summary: Right Carotid:  Velocities in the right ICA are consistent with a 1-39% stenosis. Left Carotid: Velocities in the left ICA are consistent with a 1-39% stenosis. Vertebrals: Right vertebral artery demonstrates antegrade flow. Abnormal left             waveform. *See table(s) above for measurements and observations.  Electronically signed by Delia Heady MD on 09/27/2022 at 5:14:29 PM.    Final    CT Head Wo Contrast  Result Date: 09/26/2022 CLINICAL DATA:  Mental status change, unknown cause EXAM: CT HEAD WITHOUT CONTRAST TECHNIQUE: Contiguous axial images were obtained from the base of the skull through the vertex without intravenous contrast. RADIATION DOSE REDUCTION: This exam was performed according to the departmental dose-optimization program which includes automated exposure control, adjustment of the mA and/or kV according to patient size and/or use of iterative reconstruction technique. COMPARISON:  02/16/2017 FINDINGS: Brain: No evidence of acute infarction, hemorrhage, hydrocephalus, extra-axial collection or mass lesion/mass effect. Extensive low-density changes within the periventricular and subcortical white matter most compatible with chronic microvascular ischemic change. Mild diffuse cerebral volume loss. Vascular: Atherosclerotic calcifications involving the large vessels of  the skull base. No unexpected hyperdense vessel. Skull: Normal. Negative for fracture or focal lesion. Sinuses/Orbits: No acute finding. Other: None. IMPRESSION: 1. No acute intracranial findings. 2. Chronic microvascular ischemic change and cerebral volume loss. Electronically Signed   By: Duanne Guess D.O.   On: 09/26/2022 13:49   DG Chest Portable 1 View  Result Date: 09/26/2022 CLINICAL DATA:  Weakness, dizziness EXAM: PORTABLE CHEST 1 VIEW COMPARISON:  03/23/2019 FINDINGS: The heart size and mediastinal contours are within normal limits. Diffuse bilateral interstitial pulmonary opacity. The visualized skeletal structures are  unremarkable. IMPRESSION: Diffuse bilateral interstitial pulmonary opacity, consistent with edema or atypical/viral infection. No focal airspace opacity. Electronically Signed   By: Jearld Lesch M.D.   On: 09/26/2022 13:36     PHYSICAL EXAM  Temp:  [97.5 F (36.4 C)-98.4 F (36.9 C)] 97.7 F (36.5 C) (06/25 1345) Pulse Rate:  [70-101] 81 (06/25 1345) Resp:  [16-20] 18 (06/25 1345) BP: (128-168)/(86-129) 143/101 (06/25 1345) SpO2:  [95 %-98 %] 97 % (06/25 1345)  General - Well nourished, well developed, in no apparent distress.  Ophthalmologic - fundi not visualized due to noncooperation.  Cardiovascular - Regular rhythm and rate.  Mental Status -  Level of arousal and orientation to time, place, and person were intact. Language including expression, naming, repetition, comprehension was assessed and found intact. Fund of Knowledge was assessed and was intact.  Cranial Nerves II - XII - II - Visual field intact OU. III, IV, VI - Extraocular movements intact. V - Facial sensation intact bilaterally. VII - Facial movement intact bilaterally. VIII - Hearing & vestibular intact bilaterally. X - Palate elevates symmetrically. XI - Chin turning & shoulder shrug intact bilaterally. XII - Tongue protrusion intact.  Motor Strength - The patient's strength was normal in all extremities and pronator drift was absent.  Bulk was normal and fasciculations were absent.   Motor Tone - Muscle tone was assessed at the neck and appendages and was normal.  Reflexes - The patient's reflexes were symmetrical in all extremities and he had no pathological reflexes.  Sensory - Light touch, temperature/pinprick were assessed and were symmetrical.    Coordination - The patient had normal movements in the hands with no ataxia or dysmetria. B/l mid dysmetria on HTN, likely chronic with b/l knee surgery. Tremor was absent.  Gait and Station - deferred.   ASSESSMENT/PLAN Mr. Justin Sutton is a 77  y.o. male with history of HTN, HLD, OSA, seizure admitted for dizzy, N/V and imbalance. No tPA given due to symptoms resolved and outside window.    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 High grade stenosis CT head negative CTA head and neck bilateral ICA bulb, siphon and VA origin atherosclerosis.  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 MRI left cerebellum 3-4 small scattered infarcts. 2D Echo EF 55 to 60% Carotid Doppler abnormal left VA waveform LDL 66 HgbA1c 5.4 UDS positive for THC SCDs for VTE prophylaxis aspirin 81 mg daily prior to admission, now on aspirin 81 mg daily and clopidogrel 75 mg daily for 3 months given intracranial stenosis and then Plavix alone. Patient counseled to be compliant with his antithrombotic medications Ongoing aggressive stroke risk factor management Therapy recommendations: None Disposition: Home today  Hypertension Stable Long term BP goal normotensive  Hyperlipidemia Home meds: Lipitor 80 LDL 66, goal < 70 Now on Lipitor 80 Continue statin at discharge  History  of seizure For seizure in 11/2015 06/2016 GTC, consider alcohol and THC withdrawal seizure.  No AED at the time 02/2017 and provoked seizure, follow-up with Dr. Anne Hahn at [facility], started Keppra No more seizures since Continue Keppra  Other Stroke Risk Factors Advanced age Chronic THC use - cessation education provided, patient is willing to create obstructive sleep apnea, on CPAP at home  Other Active Problems URI with Chest x-ray diffuse bilateral interstitial pulmonary opacity, consistent with edema or atypical/viral infection.  On antibiotics.  Hospital day # 0  Neurology will sign off. Please call with questions. Pt will follow up with Margie Ege NP at [facility] in about 4 weeks. Thanks for the consult.   Marvel Plan, MD PhD Stroke Neurology 09/28/2022 2:15 PM    To contact  Stroke Continuity provider, please refer to [email or web address]. After hours, contact General Neurology

## 2022-09-28 NOTE — Progress Notes (Signed)
Discharge paperwork discussed, highlighting appointments, new medications and instructions. IV and tele removed. All questions answered and paperwork given to patient. Taken down via wheelchair by tech to personal car. 

## 2022-09-28 NOTE — Discharge Summary (Signed)
Physician Discharge Summary  JOVANTE HAMMITT WJX:[id number] DOB: 23-Jun-1945 DOA: 09/26/2022  PCP: Darrow Bussing, MD  Admit date: 09/26/2022 Discharge date: 09/28/2022  Time spent: 45 minutes  Recommendations for Outpatient Follow-up:  Requires Chem-7 CBC in 1 week Recommend outpatient discussion about depression/anxiety GAD-7/PHQ-2/PHQ-9 as per wife request Would recommend repeat echo in about 6 months given mild aortic root dilatation  Discharge Diagnoses:  MAIN problem for hospitalization   Stroke HTN HLD Aortic stenosis  Please see below for itemized issues addressed in HOpsital- refer to other progress notes for clarity if needed  Discharge Condition: Guarded  Diet recommendation: Heart healthy  Filed Weights   09/26/22 1251 09/27/22 1100  Weight: 79.6 kg 79.6 kg    History of present illness:  77 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-last echo 12/2020 mild stenosis mild aortic dilatation-EF most recently 01/05/2359-65% mild LVH grade 1 DD Vasovagal syncope PAD Asbestos exposure   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 complicated by generalized withdrawal seizures and rhabdomyolysis at that time-no seizure meds were indicated at discharge patient was referred to psychiatry   Dizzy at home 6/23 progressively weaker GCS 15--retrograde amnesia about making coffee-wife says that he did he does not remember-some slurring of speech leaning to the left difficulty raising right arm Associated nausea vomiting--bladder scan 900 cc in bladder--also sinus pressure no fever no chills no diarrhea UA and normal, UDS positive for THC WBC 10.9 hemoglobin 13 platelet 338 Lactic acid troponin next 2 and EtOH normal BNP 204 Magnesium 1.6 sodium 131 PCXR = diffuse interstitial opacities?  Edema?  Infection CT head no acute findings   Neurology consulted MRI brain 6/24 patchy small  volume acute ischemic infarcts left cerebellar hemisphere no hemorrhage-moderate to advanced microvascular disease with additional small remote left cerebellar infarct  Patient was observed in the hospital and had therapy services evaluate Echo was performed--EF 55-60% mild mitral valve regurg no mitral stenosis aortic valve regurg trivial mild aortic root dilatation with moderate ascending aortic dilatation   Discharge Exam: Vitals:   09/28/22 0811 09/28/22 1345  BP: (!) 140/104 (!) 143/101  Pulse: 86 81  Resp:  18  Temp: 97.9 F (36.6 C) 97.7 F (36.5 C)  SpO2: 96% 97%    Subj on day of d/c9   EOMI NCAT no focal deficit Neck soft supple Upper and lower dentures No unilateral weakness finger-nose-finger intact Smile symmetric Lipoma on back Deformed little finger with ganglion cyst over right wrist No lower extremity Straight leg raise equal bilaterally Power 5/5  Discharge Instructions   Discharge Instructions     Diet - low sodium heart healthy   Complete by: As directed    Discharge instructions   Complete by: As directed    Had a small stroke in the back of your brain affecting the balance centers and you will need to be on both Plavix and aspirin for 3 months and then STOP ASPIRIN-because of your stroke you will need to continue Plavix lifelong We noticed that you had not been taking your atorvastatin which is a blood cholesterol lower so we will resume that and have called in the pharmacy prescriptions to our local pharmacy downstairs and it will be delivered to room to make sure that you obtain it Modifiable risk factors as we discussed for stroke are blood pressure-if you have not been taking those medications then I would resume them and I have called in a prescription once again  for amlodipine and metoprolol for you Make sure that you follow-up with your regular physician in about a week to week and a half and get labs and screening testing   Increase activity  slowly   Complete by: As directed       Allergies as of 09/28/2022   No Known Allergies      Medication List     STOP taking these medications    naproxen sodium 220 MG tablet Commonly known as: ALEVE       TAKE these medications    amLODipine 5 MG tablet Commonly known as: NORVASC Take 1 tablet (5 mg total) by mouth daily. Start taking on: September 30, 2022 What changed: These instructions start on September 30, 2022. If you are unsure what to do until then, ask your doctor or other care provider.   aspirin EC 81 MG tablet Take 1 tablet (81 mg total) by mouth daily.   atorvastatin 80 MG tablet Commonly known as: LIPITOR Take 1 tablet (80 mg total) by mouth daily.   clopidogrel 75 MG tablet Commonly known as: PLAVIX Take 1 tablet (75 mg total) by mouth daily. Start taking on: September 29, 2022   levETIRAcetam 500 MG tablet Commonly known as: KEPPRA TAKE 1 TABLET BY MOUTH TWICE A DAY What changed:  how much to take how to take this when to take this additional instructions   metoprolol succinate 25 MG 24 hr tablet Commonly known as: Toprol XL Take 1 tablet (25 mg total) by mouth daily. Start taking on: September 30, 2022 What changed: These instructions start on September 30, 2022. If you are unsure what to do until then, ask your doctor or other care provider.   Multivitamin Men 50+ Tabs Take 1 tablet by mouth daily.   traZODone 50 MG tablet Commonly known as: DESYREL Take 50 mg by mouth at bedtime as needed for sleep.       No Known Allergies    The results of significant diagnostics from this hospitalization (including imaging, microbiology, ancillary and laboratory) are listed below for reference.    Significant Diagnostic Studies: ECHOCARDIOGRAM COMPLETE  Result Date: 09/28/2022    ECHOCARDIOGRAM REPORT   Patient Name:   HERCULES HASLER Date of Exam: 09/28/2022 Medical Rec #:  [id number]        Height:       71.0 in Accession #:    [id number]       Weight:        175.5 lb Date of Birth:  27-Feb-1946        BSA:          1.995 m Patient Age:    77 years         BP:           140/104 mmHg Patient Gender: M                HR:           80 bpm. Exam Location:  Inpatient Procedure: 2D Echo, Cardiac Doppler and Color Doppler Indications:    TIA  History:        Patient has prior history of Echocardiogram examinations, most                 recent 01/04/2022. Aortic Valve Disease, Arrythmias:PVC; Risk                 Factors:Sleep Apnea, Hypertension and Dyslipidemia. TAA,  asbestos exposure.  Sonographer:    Milda Smart Referring Phys: [id number] DAVID MANUEL ORTIZ  Sonographer Comments: Image acquisition challenging due to patient body habitus and Image acquisition challenging due to respiratory motion. IMPRESSIONS  1. Left ventricular ejection fraction, by estimation, is 55 to 60%. The left ventricle has normal function. The left ventricle has no regional wall motion abnormalities. There is mild concentric left ventricular hypertrophy. Left ventricular diastolic parameters are indeterminate.  2. Right ventricular systolic function is mildly reduced. The right ventricular size is normal.  3. The mitral valve is normal in structure. Mild mitral valve regurgitation. No evidence of mitral stenosis.  4. The aortic valve is normal in structure. Aortic valve regurgitation is trivial. Mild aortic valve stenosis. Aortic valve area, by VTI measures 2.03 cm. Aortic valve mean gradient measures 12.0 mmHg. Aortic valve Vmax measures 2.26 m/s.  5. There is mild dilatation of the aortic root, measuring 38 mm. There is moderate dilatation of the ascending aorta, measuring 44 mm.  6. The inferior vena cava is normal in size with greater than 50% respiratory variability, suggesting right atrial pressure of 3 mmHg. FINDINGS  Left Ventricle: Left ventricular ejection fraction, by estimation, is 55 to 60%. The left ventricle has normal function. The left ventricle has no regional wall  motion abnormalities. The left ventricular internal cavity size was normal in size. There is  mild concentric left ventricular hypertrophy. Left ventricular diastolic parameters are indeterminate. Right Ventricle: The right ventricular size is normal. No increase in right ventricular wall thickness. Right ventricular systolic function is mildly reduced. Left Atrium: Left atrial size was normal in size. Right Atrium: Right atrial size was normal in size. Pericardium: There is no evidence of pericardial effusion. Mitral Valve: The mitral valve is normal in structure. Mild mitral valve regurgitation. No evidence of mitral valve stenosis. Tricuspid Valve: The tricuspid valve is normal in structure. Tricuspid valve regurgitation is not demonstrated. No evidence of tricuspid stenosis. Aortic Valve: The aortic valve is normal in structure. Aortic valve regurgitation is trivial. Mild aortic stenosis is present. Aortic valve mean gradient measures 12.0 mmHg. Aortic valve peak gradient measures 20.4 mmHg. Aortic valve area, by VTI measures 2.03 cm. Pulmonic Valve: The pulmonic valve was normal in structure. Pulmonic valve regurgitation is not visualized. No evidence of pulmonic stenosis. Aorta: There is mild dilatation of the aortic root, measuring 38 mm. There is moderate dilatation of the ascending aorta, measuring 44 mm. Venous: The inferior vena cava is normal in size with greater than 50% respiratory variability, suggesting right atrial pressure of 3 mmHg. IAS/Shunts: No atrial level shunt detected by color flow Doppler.  LEFT VENTRICLE PLAX 2D LVIDd:         3.70 cm     Diastology LVIDs:         2.40 cm     LV e' medial:  8.05 cm/s LV PW:         1.10 cm     LV e' lateral: 8.70 cm/s LV IVS:        1.10 cm LVOT diam:     2.30 cm LV SV:         81 LV SV Index:   41 LVOT Area:     4.15 cm  LV Volumes (MOD) LV vol d, MOD A2C: 86.9 ml LV vol d, MOD A4C: 90.5 ml LV vol s, MOD A2C: 36.8 ml LV vol s, MOD A4C: 31.4 ml LV SV  MOD A2C:     50.1  ml LV SV MOD A4C:     90.5 ml LV SV MOD BP:      56.7 ml RIGHT VENTRICLE            IVC RV Basal diam:  3.80 cm    IVC diam: 1.10 cm RV Mid diam:    2.90 cm RV S prime:     7.72 cm/s TAPSE (M-mode): 1.5 cm LEFT ATRIUM             Index        RIGHT ATRIUM           Index LA diam:        4.10 cm 2.06 cm/m   RA Area:     14.00 cm LA Vol (A2C):   41.4 ml 20.76 ml/m  RA Volume:   29.50 ml  14.79 ml/m LA Vol (A4C):   33.8 ml 16.92 ml/m LA Biplane Vol: 41.8 ml 20.96 ml/m  AORTIC VALVE AV Area (Vmax):    1.86 cm AV Area (Vmean):   1.89 cm AV Area (VTI):     2.03 cm AV Vmax:           226.00 cm/s AV Vmean:          162.000 cm/s AV VTI:            0.402 m AV Peak Grad:      20.4 mmHg AV Mean Grad:      12.0 mmHg LVOT Vmax:         101.00 cm/s LVOT Vmean:        73.600 cm/s LVOT VTI:          0.196 m LVOT/AV VTI ratio: 0.49  AORTA Ao Root diam: 3.80 cm Ao Asc diam:  4.40 cm MR Peak grad: 24.0 mmHg MR Vmax:      245.00 cm/s SHUNTS                           Systemic VTI:  0.20 m                           Systemic Diam: 2.30 cm Kardie Tobb DO Electronically signed by Thomasene Ripple DO Signature Date/Time: 09/28/2022/11:59:08 AM    Final    MR BRAIN WO CONTRAST  Result Date: 09/28/2022 CLINICAL DATA:  Initial evaluation for neuro deficit, stroke. EXAM: MRI HEAD WITHOUT CONTRAST TECHNIQUE: Multiplanar, multiecho pulse sequences of the brain and surrounding structures were obtained without intravenous contrast. COMPARISON:  Prior CTs from earlier the same day. FINDINGS: Brain: Cerebral volume within normal limits. Patchy T2/FLAIR hyperintensity involving the periventricular deep white matter both cerebral hemispheres as well as the pons, consistent with chronic small vessel ischemic disease, moderate to advanced in nature. Small remote left cerebellar infarct noted. Patchy small volume restricted diffusion seen involving the inferior left cerebellar hemisphere, consistent with acute ischemic infarcts  (series 2, image 11, 10, 9, 7). No associated hemorrhage or mass effect. No other evidence for acute or subacute ischemia. Gray-white matter differentiation otherwise maintained. No acute intracranial hemorrhage. Few scattered chronic micro hemorrhages noted at the left pons and left parietal region, likely small vessel related. No mass lesion, midline shift or mass effect. No hydrocephalus or extra-axial fluid collection. Pituitary gland suprasellar region within normal limits. Vascular: Major intracranial vascular flow voids are maintained. Skull and upper cervical spine: Craniocervical junction within normal limits. Degenerative changes noted about the  dens and tectorial membrane. Bone marrow signal intensity normal. No scalp soft tissue abnormality. Sinuses/Orbits: Prior bilateral ocular lens replacement. Small right maxillary sinus retention cyst noted. Paranasal sinuses are otherwise clear. No significant mastoid effusion. Other: None. IMPRESSION: 1. Patchy small volume acute ischemic infarcts involving the inferior left cerebellar hemisphere. No associated hemorrhage or mass effect. 2. Underlying moderate to advanced chronic microvascular ischemic disease, within additional small remote left cerebellar infarct. Electronically Signed   By: Rise Mu M.D.   On: 09/28/2022 02:41   CT ANGIO HEAD NECK W WO CM  Result Date: 09/27/2022 CLINICAL DATA:  Neuro deficit, acute, stroke suspected. Mental status change of unknown cause. EXAM: CT ANGIOGRAPHY HEAD AND NECK WITH AND WITHOUT CONTRAST TECHNIQUE: Multidetector CT imaging of the head and neck was performed using the standard protocol during bolus administration of intravenous contrast. Multiplanar CT image reconstructions and MIPs were obtained to evaluate the vascular anatomy. Carotid stenosis measurements (when applicable) are obtained utilizing NASCET criteria, using the distal internal carotid diameter as the denominator. RADIATION DOSE  REDUCTION: This exam was performed according to the departmental dose-optimization program which includes automated exposure control, adjustment of the mA and/or kV according to patient size and/or use of iterative reconstruction technique. CONTRAST:  75mL OMNIPAQUE IOHEXOL 350 MG/ML SOLN COMPARISON:  Head CT yesterday. FINDINGS: CT HEAD FINDINGS Brain: Age related volume loss. Extensive chronic small-vessel ischemic changes of the cerebral hemispheric white matter. No visible acute infarction, mass lesion, hemorrhage, hydrocephalus or extra-axial collection. Vascular: There is atherosclerotic calcification of the major vessels at the base of the brain. Skull: Negative Sinuses/Orbits: Clear/normal Other: None Review of the MIP images confirms the above findings CTA NECK FINDINGS Aortic arch: Aortic atherosclerosis with soft and calcified plaque. Branching pattern is normal without origin stenosis. Right carotid system: Common carotid artery widely patent to the bifurcation. Calcified plaque at the carotid bifurcation and ICA bulb. No stenosis. Cervical ICA is tortuous but widely patent beyond that. Left carotid system: Common carotid artery widely patent to the bifurcation region. Calcified plaque at the distal common carotid artery, carotid bifurcation and ICA bulb but no stenosis. Cervical ICA is tortuous but widely patent beyond that. Vertebral arteries: Calcified plaque at both vertebral artery origins. Stenosis estimated at 30% on both sides. Beyond the origins, the vessels are patent through the cervical region to the foramen magnum. Skeleton: Advanced cervical spondylosis. Other neck: No mass or lymphadenopathy. Upper chest: Emphysema in the upper lungs. No evidence of mass or pneumonia. Review of the MIP images confirms the above findings CTA HEAD FINDINGS Anterior circulation: Both internal carotid arteries are patent through the skull base and siphon regions. There is siphon atherosclerotic calcification  with stenosis estimated at 30-50% on both sides. The anterior and middle cerebral vessels are patent. No large vessel occlusion or proximal stenosis. No aneurysm or vascular malformation. Posterior circulation: Both vertebral arteries are patent through the foramen magnum to the basilar. No basilar artery stenosis. Posterior circulation branch vessels are patent. Primary anterior circulation supply to the posterior cerebral arteries. Venous sinuses: Patent and normal. Anatomic variants: None other significant. Review of the MIP images confirms the above findings IMPRESSION: 1. No acute large or medium vessel occlusion. 2. Aortic atherosclerosis. 3. Atherosclerotic disease at both carotid bifurcations but without stenosis. 4. Atherosclerotic disease at both vertebral artery origins with stenosis estimated at 30% on both sides. 5. Atherosclerotic disease in both carotid siphon regions with stenosis estimated at 30-50% on both sides. Aortic Atherosclerosis (ICD10-I70.0) and Emphysema (  ICD10-J43.9). Electronically Signed   By: Paulina Fusi M.D.   On: 09/27/2022 18:19   VAS US CAROTID  Result Date: 09/27/2022 Carotid Arterial Duplex Study Patient Name:  AIDENN SKELLENGER  Date of Exam:   09/27/2022 Medical Rec #: [id number]         Accession #:    [id number] Date of Birth: 1946-01-23         Patient Gender: M Patient Age:   52 years Exam Location:  [facility] Procedure:      VAS US CAROTID Referring Phys: DAVID ORTIZ --------------------------------------------------------------------------------  Indications:       TIA. Risk Factors:      Hypertension, hyperlipidemia. Limitations        Today's exam was limited due to the high bifurcation of the                    carotid. Comparison Study:  No prior studies. Performing Technologist: Chanda Busing RVT  Examination Guidelines: A complete evaluation includes B-mode imaging, spectral Doppler, color Doppler, and power Doppler as needed of all accessible portions  of each vessel. Bilateral testing is considered an integral part of a complete examination. Limited examinations for reoccurring indications may be performed as noted.  Right Carotid Findings: +----------+--------+--------+--------+-----------------------+--------+           PSV cm/sEDV cm/sStenosisPlaque Description     Comments +----------+--------+--------+--------+-----------------------+--------+ CCA Prox  55      14              smooth and heterogenous         +----------+--------+--------+--------+-----------------------+--------+ CCA Distal49      16              smooth and heterogenous         +----------+--------+--------+--------+-----------------------+--------+ ICA Prox  42      15              smooth and heterogenous         +----------+--------+--------+--------+-----------------------+--------+ ICA Mid   61      21                                              +----------+--------+--------+--------+-----------------------+--------+ ICA Distal60      22                                     tortuous +----------+--------+--------+--------+-----------------------+--------+ ECA       86      12                                              +----------+--------+--------+--------+-----------------------+--------+ +----------+--------+-------+--------+-------------------+           PSV cm/sEDV cmsDescribeArm Pressure (mmHG) +----------+--------+-------+--------+-------------------+ [id number]                                         +----------+--------+-------+--------+-------------------+ +---------+--------+--+--------+--+---------+ VertebralPSV cm/s42EDV cm/s14Antegrade +---------+--------+--+--------+--+---------+  Left Carotid Findings: +----------+--------+--------+--------+-----------------------+--------+           PSV cm/sEDV cm/sStenosisPlaque Description     Comments  +----------+--------+--------+--------+-----------------------+--------+ CCA Prox  62  15              smooth and heterogenous         +----------+--------+--------+--------+-----------------------+--------+ CCA Distal37      14              smooth and heterogenous         +----------+--------+--------+--------+-----------------------+--------+ ICA Prox  39      16              smooth and heterogenous         +----------+--------+--------+--------+-----------------------+--------+ ICA Mid   68      22                                     tortuous +----------+--------+--------+--------+-----------------------+--------+ ICA Distal88      22                                     tortuous +----------+--------+--------+--------+-----------------------+--------+ ECA       71      11                                              +----------+--------+--------+--------+-----------------------+--------+ +----------+--------+--------+--------+-------------------+           PSV cm/sEDV cm/sDescribeArm Pressure (mmHG) +----------+--------+--------+--------+-------------------+ [id number]                                          +----------+--------+--------+--------+-------------------+ +---------+--------+--+--------+--+---------+ VertebralPSV cm/s24EDV cm/s17Antegrade +---------+--------+--+--------+--+---------+   Summary: Right Carotid: Velocities in the right ICA are consistent with a 1-39% stenosis. Left Carotid: Velocities in the left ICA are consistent with a 1-39% stenosis. Vertebrals: Right vertebral artery demonstrates antegrade flow. Abnormal left             waveform. *See table(s) above for measurements and observations.  Electronically signed by Delia Heady MD on 09/27/2022 at 5:14:29 PM.    Final    CT Head Wo Contrast  Result Date: 09/26/2022 CLINICAL DATA:  Mental status change, unknown cause EXAM: CT HEAD WITHOUT CONTRAST TECHNIQUE: Contiguous  axial images were obtained from the base of the skull through the vertex without intravenous contrast. RADIATION DOSE REDUCTION: This exam was performed according to the departmental dose-optimization program which includes automated exposure control, adjustment of the mA and/or kV according to patient size and/or use of iterative reconstruction technique. COMPARISON:  02/16/2017 FINDINGS: Brain: No evidence of acute infarction, hemorrhage, hydrocephalus, extra-axial collection or mass lesion/mass effect. Extensive low-density changes within the periventricular and subcortical white matter most compatible with chronic microvascular ischemic change. Mild diffuse cerebral volume loss. Vascular: Atherosclerotic calcifications involving the large vessels of the skull base. No unexpected hyperdense vessel. Skull: Normal. Negative for fracture or focal lesion. Sinuses/Orbits: No acute finding. Other: None. IMPRESSION: 1. No acute intracranial findings. 2. Chronic microvascular ischemic change and cerebral volume loss. Electronically Signed   By: Duanne Guess D.O.   On: 09/26/2022 13:49   DG Chest Portable 1 View  Result Date: 09/26/2022 CLINICAL DATA:  Weakness, dizziness EXAM: PORTABLE CHEST 1 VIEW COMPARISON:  03/23/2019 FINDINGS: The heart size and mediastinal contours are within normal limits. Diffuse bilateral  interstitial pulmonary opacity. The visualized skeletal structures are unremarkable. IMPRESSION: Diffuse bilateral interstitial pulmonary opacity, consistent with edema or atypical/viral infection. No focal airspace opacity. Electronically Signed   By: Jearld Lesch M.D.   On: 09/26/2022 13:36    Microbiology: Recent Results (from the past 240 hour(s))  Resp panel by RT-PCR (RSV, Flu A&B, Covid) Anterior Nasal Swab     Status: None   Collection Time: 09/26/22  2:02 PM   Specimen: Anterior Nasal Swab  Result Value Ref Range Status   SARS Coronavirus 2 by RT PCR NEGATIVE NEGATIVE Final     Comment: (NOTE) SARS-CoV-2 target nucleic acids are NOT DETECTED.  The SARS-CoV-2 RNA is generally detectable in upper respiratory specimens during the acute phase of infection. The lowest concentration of SARS-CoV-2 viral copies this assay can detect is 138 copies/mL. A negative result does not preclude SARS-Cov-2 infection and should not be used as the sole basis for treatment or other patient management decisions. A negative result may occur with  improper specimen collection/handling, submission of specimen other than nasopharyngeal swab, presence of viral mutation(s) within the areas targeted by this assay, and inadequate number of viral copies(<138 copies/mL). A negative result must be combined with clinical observations, patient history, and epidemiological information. The expected result is Negative.  Fact Sheet for Patients:  BloggerCourse.com  Fact Sheet for Healthcare Providers:  SeriousBroker.it  This test is no t yet approved or cleared by the [place] FDA and  has been authorized for detection and/or diagnosis of SARS-CoV-2 by FDA under an Emergency Use Authorization (EUA). This EUA will remain  in effect (meaning this test can be used) for the duration of the COVID-19 declaration under Section 564(b)(1) of the Act, 21 U.S.C.section 360bbb-3(b)(1), unless the authorization is terminated  or revoked sooner.       Influenza A by PCR NEGATIVE NEGATIVE Final   Influenza B by PCR NEGATIVE NEGATIVE Final    Comment: (NOTE) The Xpert Xpress SARS-CoV-2/FLU/RSV plus assay is intended as an aid in the diagnosis of influenza from Nasopharyngeal swab specimens and should not be used as a sole basis for treatment. Nasal washings and aspirates are unacceptable for Xpert Xpress SARS-CoV-2/FLU/RSV testing.  Fact Sheet for Patients: BloggerCourse.com  Fact Sheet for Healthcare  Providers: SeriousBroker.it  This test is not yet approved or cleared by the [place] FDA and has been authorized for detection and/or diagnosis of SARS-CoV-2 by FDA under an Emergency Use Authorization (EUA). This EUA will remain in effect (meaning this test can be used) for the duration of the COVID-19 declaration under Section 564(b)(1) of the Act, 21 U.S.C. section 360bbb-3(b)(1), unless the authorization is terminated or revoked.     Resp Syncytial Virus by PCR NEGATIVE NEGATIVE Final    Comment: (NOTE) Fact Sheet for Patients: BloggerCourse.com  Fact Sheet for Healthcare Providers: SeriousBroker.it  This test is not yet approved or cleared by the [place] FDA and has been authorized for detection and/or diagnosis of SARS-CoV-2 by FDA under an Emergency Use Authorization (EUA). This EUA will remain in effect (meaning this test can be used) for the duration of the COVID-19 declaration under Section 564(b)(1) of the Act, 21 U.S.C. section 360bbb-3(b)(1), unless the authorization is terminated or revoked.  Performed at [facility], [address] [place], [place] [address]   Culture, blood (routine x 2)     Status: None (Preliminary result)   Collection Time: 09/26/22  2:30 PM   Specimen: BLOOD  Result Value Ref  Range Status   Specimen Description   Final    BLOOD RIGHT ANTECUBITAL Performed at [facility], [address], [place], [place] [address]    Special Requests   Final    BOTTLES DRAWN AEROBIC AND ANAEROBIC Blood Culture adequate volume Performed at [facility], [address] [place], [place] [address]    Culture   Final    NO GROWTH 2 DAYS Performed at [facility], [address] [place], [place] [address]    Report Status PENDING  Incomplete  Culture, blood (routine x 2)     Status: None (Preliminary  result)   Collection Time: 09/26/22  2:50 PM   Specimen: BLOOD RIGHT HAND  Result Value Ref Range Status   Specimen Description   Final    BLOOD RIGHT HAND Performed at [facility], [address] [place], [place] [address]    Special Requests   Final    BOTTLES DRAWN AEROBIC ONLY Blood Culture adequate volume Performed at [facility], [address] [place], [place] [address]    Culture   Final    NO GROWTH 2 DAYS Performed at [facility], [address] [place], [place] [address]    Report Status PENDING  Incomplete  Respiratory (~20 pathogens) panel by PCR     Status: None   Collection Time: 09/27/22  3:10 PM   Specimen: Nasopharyngeal Swab; Respiratory  Result Value Ref Range Status   Adenovirus NOT DETECTED NOT DETECTED Final   Coronavirus 229E NOT DETECTED NOT DETECTED Final    Comment: (NOTE) The Coronavirus on the Respiratory Panel, DOES NOT test for the novel  Coronavirus (2019 nCoV)    Coronavirus HKU1 NOT DETECTED NOT DETECTED Final   Coronavirus NL63 NOT DETECTED NOT DETECTED Final   Coronavirus OC43 NOT DETECTED NOT DETECTED Final   Metapneumovirus NOT DETECTED NOT DETECTED Final   Rhinovirus / Enterovirus NOT DETECTED NOT DETECTED Final   Influenza A NOT DETECTED NOT DETECTED Final   Influenza B NOT DETECTED NOT DETECTED Final   Parainfluenza Virus 1 NOT DETECTED NOT DETECTED Final   Parainfluenza Virus 2 NOT DETECTED NOT DETECTED Final   Parainfluenza Virus 3 NOT DETECTED NOT DETECTED Final   Parainfluenza Virus 4 NOT DETECTED NOT DETECTED Final   Respiratory Syncytial Virus NOT DETECTED NOT DETECTED Final   Bordetella pertussis NOT DETECTED NOT DETECTED Final   Bordetella Parapertussis NOT DETECTED NOT DETECTED Final   Chlamydophila pneumoniae NOT DETECTED NOT DETECTED Final   Mycoplasma pneumoniae NOT DETECTED NOT DETECTED Final    Comment: Performed at [facility], [address] [place], [place] [address]   Expectorated Sputum Assessment w Gram Stain, Rflx to Resp Cult     Status: None   Collection Time: 09/27/22  3:25 PM   Specimen: Sputum  Result Value Ref Range Status   Specimen Description SPU  Final   Special Requests NONE  Final   Sputum evaluation   Final    Sputum specimen not acceptable for testing.  Please recollect.   notified RN ZARA JAMA ON 09/27/22 @ 1705 BY DRT Performed at [facility], [address] [place], [place] [address]    Report Status 09/27/2022 FINAL  Final     Labs: Basic Metabolic Panel: Recent Labs  Lab 09/26/22 1305 09/27/22 1304 09/28/22 0341  NA 131* 129* 133*  K 3.6 3.4* 3.7  CL 98 98 101  CO2 23 21* 24  GLUCOSE 133* 104* 106*  BUN 8 8 10   CREATININE 0.64 0.68 0.75  CALCIUM 9.2 9.0 8.7*  MG 1.6*  --   --    Liver Function Tests: Recent Labs  Lab 09/26/22 1305 09/27/22 1304  AST 21 27  ALT 20 24  ALKPHOS 68 69  BILITOT 0.7 0.9  PROT 7.4 7.2  ALBUMIN 4.7 4.2   No results for input(s): "LIPASE", "AMYLASE" in the last 168 hours. No results for input(s): "AMMONIA" in the last 168 hours. CBC: Recent Labs  Lab 09/26/22 1305 09/27/22 1304 09/28/22 0341  WBC 10.9* 10.1 10.1  NEUTROABS 9.7*  --   --   HGB 13.6 14.3 14.7  HCT 38.7* 41.9 43.2  MCV 87.0 86.6 90.2  PLT 338 365 329   Cardiac Enzymes: No results for input(s): "CKTOTAL", "CKMB", "CKMBINDEX", "TROPONINI" in the last 168 hours. BNP: BNP (last 3 results) Recent Labs    09/26/22 1305  BNP 204.3*    ProBNP (last 3 results) No results for input(s): "PROBNP" in the last 8760 hours.  CBG: Recent Labs  Lab 09/26/22 1249  GLUCAP 138*       Signed:  Rhetta Mura MD   [facility] 09/28/2022, 2:12 PM

## 2022-09-28 NOTE — Progress Notes (Signed)
OT Cancellation Note  Patient Details Name: Justin Sutton MRN: [id number] DOB: 04-Sep-1945   Cancelled Treatment:    Reason Eval/Treat Not Completed: OT screened, no needs identified, will sign off At baseline for ADLs/mobility with cane per PT. No formal OT eval needed at this time.  Lorre Munroe 09/28/2022, 8:58 AM

## 2022-09-28 NOTE — Plan of Care (Signed)
  Problem: Education: Goal: Knowledge of General Education information will improve Description: Including pain rating scale, medication(s)/side effects and non-pharmacologic comfort measures Outcome: Adequate for Discharge   Problem: Health Behavior/Discharge Planning: Goal: Ability to manage health-related needs will improve Outcome: Adequate for Discharge   Problem: Clinical Measurements: Goal: Ability to maintain clinical measurements within normal limits will improve Outcome: Adequate for Discharge Goal: Will remain free from infection Outcome: Adequate for Discharge Goal: Diagnostic test results will improve Outcome: Adequate for Discharge Goal: Respiratory complications will improve Outcome: Adequate for Discharge Goal: Cardiovascular complication will be avoided Outcome: Adequate for Discharge   Problem: Activity: Goal: Risk for activity intolerance will decrease Outcome: Adequate for Discharge   Problem: Nutrition: Goal: Adequate nutrition will be maintained Outcome: Adequate for Discharge   Problem: Coping: Goal: Level of anxiety will decrease Outcome: Adequate for Discharge   Problem: Elimination: Goal: Will not experience complications related to bowel motility Outcome: Adequate for Discharge Goal: Will not experience complications related to urinary retention Outcome: Adequate for Discharge   Problem: Pain Managment: Goal: General experience of comfort will improve Outcome: Adequate for Discharge   Problem: Safety: Goal: Ability to remain free from injury will improve Outcome: Adequate for Discharge   Problem: Skin Integrity: Goal: Risk for impaired skin integrity will decrease Outcome: Adequate for Discharge   Problem: Education: Goal: Knowledge of disease or condition will improve Outcome: Adequate for Discharge Goal: Knowledge of secondary prevention will improve (MUST DOCUMENT ALL) Outcome: Adequate for Discharge Goal: Knowledge of patient  specific risk factors will improve Justin Sutton N/A or DELETE if not current risk factor) Outcome: Adequate for Discharge   Problem: Ischemic Stroke/TIA Tissue Perfusion: Goal: Complications of ischemic stroke/TIA will be minimized Outcome: Adequate for Discharge   Problem: Coping: Goal: Will verbalize positive feelings about self Outcome: Adequate for Discharge Goal: Will identify appropriate support needs Outcome: Adequate for Discharge   Problem: Health Behavior/Discharge Planning: Goal: Ability to manage health-related needs will improve Outcome: Adequate for Discharge Goal: Goals will be collaboratively established with patient/family Outcome: Adequate for Discharge   Problem: Self-Care: Goal: Ability to participate in self-care as condition permits will improve Outcome: Adequate for Discharge Goal: Verbalization of feelings and concerns over difficulty with self-care will improve Outcome: Adequate for Discharge Goal: Ability to communicate needs accurately will improve Outcome: Adequate for Discharge

## 2022-09-29 ENCOUNTER — Other Ambulatory Visit (HOSPITAL_COMMUNITY): Payer: Self-pay

## 2022-09-29 LAB — LEGIONELLA PNEUMOPHILA SEROGP 1 UR AG: L. pneumophila Serogp 1 Ur Ag: NEGATIVE

## 2022-09-29 LAB — CULTURE, BLOOD (ROUTINE X 2): Culture: NO GROWTH

## 2022-09-30 DIAGNOSIS — I779 Disorder of arteries and arterioles, unspecified: Secondary | ICD-10-CM | POA: Diagnosis not present

## 2022-09-30 DIAGNOSIS — Z79899 Other long term (current) drug therapy: Secondary | ICD-10-CM | POA: Diagnosis not present

## 2022-09-30 DIAGNOSIS — I700 Atherosclerosis of aorta: Secondary | ICD-10-CM | POA: Diagnosis not present

## 2022-09-30 DIAGNOSIS — I639 Cerebral infarction, unspecified: Secondary | ICD-10-CM | POA: Diagnosis not present

## 2022-09-30 DIAGNOSIS — R413 Other amnesia: Secondary | ICD-10-CM | POA: Diagnosis not present

## 2022-09-30 DIAGNOSIS — I77819 Aortic ectasia, unspecified site: Secondary | ICD-10-CM | POA: Diagnosis not present

## 2022-09-30 LAB — VITAMIN B1: Vitamin B1 (Thiamine): 135.1 nmol/L (ref 66.5–200.0)

## 2022-10-01 LAB — CULTURE, BLOOD (ROUTINE X 2)

## 2022-10-04 DIAGNOSIS — Z79899 Other long term (current) drug therapy: Secondary | ICD-10-CM | POA: Diagnosis not present

## 2022-11-25 ENCOUNTER — Encounter: Payer: Self-pay | Admitting: Neurology

## 2022-11-25 ENCOUNTER — Ambulatory Visit (INDEPENDENT_AMBULATORY_CARE_PROVIDER_SITE_OTHER): Payer: Medicare Other | Admitting: Neurology

## 2022-11-25 VITALS — BP 138/70 | HR 72 | Ht 71.75 in | Wt 184.5 lb

## 2022-11-25 DIAGNOSIS — E785 Hyperlipidemia, unspecified: Secondary | ICD-10-CM | POA: Diagnosis not present

## 2022-11-25 DIAGNOSIS — R569 Unspecified convulsions: Secondary | ICD-10-CM

## 2022-11-25 DIAGNOSIS — I10 Essential (primary) hypertension: Secondary | ICD-10-CM

## 2022-11-25 DIAGNOSIS — I639 Cerebral infarction, unspecified: Secondary | ICD-10-CM | POA: Diagnosis not present

## 2022-11-25 MED ORDER — LEVETIRACETAM 500 MG PO TABS: ORAL_TABLET | ORAL | 3 refills | Status: DC

## 2022-11-25 NOTE — Patient Instructions (Addendum)
Continue the Keppra for seizures Continue aspirin + plavix until mid September 24th, then Plavix 75 mg daily alone Strict management of vascular risk factors with a goal BP less than 130/90, A1c less than 7.0, LDL less than 70 for secondary stroke prevention Continue to see your primary care doctor Recommendations for Outpatient Follow-up:  Discussion about mood with PCP, depression/anxiety  Would recommend repeat echo in about 6 months given mild aortic root dilatation  Follow up in Feb 2025

## 2022-11-25 NOTE — Progress Notes (Signed)
Patient: Justin Sutton Date of Birth: 06-Nov-1945  Reason for Visit: Follow up for stroke  History from: Patient, wife  Primary Neurologist: Pearlean Brownie   ASSESSMENT AND PLAN 77 y.o. 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  History of well-controlled seizure disorder on Keppra.  Vascular risk factors: HTN, HLD.  No residual deficits.  -Continue Keppra 500 mg twice daily for secondary stroke prevention -Continue aspirin 81 and Plavix 75 mg daily until September 24, then Plavix 75 mg daily alone for secondary stroke prevention -I have recommended against THC usage -Follow-up with PCP for longstanding mood concerns, also hospital recommended repeat echo in 6 months given mild aortic root dilation -Strict management of vascular risk factors with a goal BP less than 130/90, A1c less than 7.0, LDL less than 70 for secondary stroke prevention -Keep follow-up appointment with me in February 2025  HISTORY OF PRESENT ILLNESS: Today 11/25/22 Here with his wife. Doing well since CVA. Back to normal. No more dizziness, or gait instability. Has continued carrying his cane. On aspirin 81 and Plavix 75 mg daily. No bruising or bleeding. Remains on Lipitor 80. No seizures, still on Keppra 500 mg BID. Does not smoke or drink alcohol.  Does smoke marijuana on occasion.  We talked about anxiety/depression, some gets discouraged when not in control. In the past on Zoloft, he stopped it did not like how it made him feel.  He prefers not to drive, his wife does the driving.  He denies being evaluated for OSA, claims he would never use CPAP.  His wife reports occasional snoring but not significant.  HISTORY  Justin Sutton is an established patient of mine with well-controlled seizures on Keppra 500 mg twice daily.  Went to the ER 09/27/2022 for dizziness, n/v and  imbalance.  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 Copied Hospital Imaging below:   CT head negative CTA head and neck bilateral ICA bulb, siphon and VA origin atherosclerosis.  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 MRI left cerebellum 3-4 small scattered infarcts. 2D Echo EF 55 to 60% Carotid Doppler abnormal left VA waveform LDL 66, continued on Lipitor 80 HgbA1c 5.4 UDS positive for THC SCDs for VTE prophylaxis aspirin 81 mg daily prior to admission, now on aspirin 81 mg daily and clopidogrel 75 mg daily for 3 months given intracranial stenosis and then Plavix alone.  REVIEW OF SYSTEMS: Out of a complete 14 system review of symptoms, the patient complains only of the following symptoms, and all other reviewed systems are negative.  See HPI  ALLERGIES: No Known Allergies  HOME MEDICATIONS: Outpatient Medications Prior to Visit  Medication Sig Dispense Refill   amLODipine (NORVASC) 5 MG tablet Take 1 tablet (5 mg total) by mouth daily. 30 tablet 3   aspirin EC 81 MG tablet Take 1 tablet (81 mg total) by mouth daily. 120 tablet 0   atorvastatin (LIPITOR) 80 MG tablet Take 1 tablet (80 mg total) by mouth daily. 90 tablet 3   clopidogrel (PLAVIX) 75 MG tablet Take 1 tablet (75 mg total) by mouth daily. 30 tablet 11   metoprolol succinate (TOPROL XL) 25 MG 24 hr tablet Take 1 tablet (25 mg total) by mouth daily. 90 tablet 3   Multiple Vitamins-Minerals (MULTIVITAMIN  MEN 50+) TABS Take 1 tablet by mouth daily.     traZODone (DESYREL) 50 MG tablet Take 50 mg by mouth at bedtime as needed for sleep.     levETIRAcetam (KEPPRA) 500 MG tablet TAKE 1 TABLET BY MOUTH TWICE A DAY (Patient taking differently: Take 500 mg by mouth 2 (two) times daily.) 180 tablet 3   No facility-administered medications prior to visit.    PAST MEDICAL HISTORY: Past Medical History:   Diagnosis Date   Arthritis    Chronic cough    Essential hypertension 02/14/2015   GERD (gastroesophageal reflux disease)    H/O asbestos exposure    Hearing loss    Heart murmur    Hyperlipidemia    Hypertension    Nocturia    Numbness    comes and goes per right hand    OSA (obstructive sleep apnea)    Thoracic aortic aneurysm without rupture (HCC) 02/14/2015   4.1 cm 01/2015    PAST SURGICAL HISTORY: Past Surgical History:  Procedure Laterality Date   CATARACT EXTRACTION  2011   bilat    HAND SURGERY Right 1983   HERNIA REPAIR  2011   TONSILLECTOMY     TOTAL KNEE ARTHROPLASTY Left 2011   TOTAL KNEE ARTHROPLASTY Right 04/14/2015   Procedure: RIGHT TOTAL KNEE ARTHROPLASTY;  Surgeon: Ollen Gross, MD;  Location: [facility];  Service: Orthopedics;  Laterality: Right;    FAMILY HISTORY: Family History  Problem Relation Age of Onset   Asthma Mother    Heart disease Father    Prostate cancer Father     SOCIAL HISTORY: Social History   Socioeconomic History   Marital status: Married    Spouse name: cindy   Number of children: 3   Years of education: Not on file   Highest education level: Master's degree (e.g., MA, MS, MEng, MEd, MSW, MBA)  Occupational History   Occupation: Retired Cabin crew  Tobacco Use   Smoking status: Former    Current packs/day: 0.00    Average packs/day: 3.0 packs/day for 15.0 years (45.0 ttl pk-yrs)    Types: Cigarettes    Start date: 04/06/1955    Quit date: 04/05/1970    Years since quitting: 52.6   Smokeless tobacco: Never  Substance and Sexual Activity   Alcohol use: Not Currently    Comment: beer daily 2 per day    Drug use: No   Sexual activity: Not Currently    Birth control/protection: None  Other Topics Concern   Not on file  Social History Narrative   Epwort Sleepiness Scale = 1 (as of 02/14/15)   Social Determinants of Health   Financial Resource Strain: Not on file  Food Insecurity: No Food Insecurity (09/27/2022)   Hunger  Vital Sign    Worried About Running Out of Food in the Last Year: Never true    Ran Out of Food in the Last Year: Never true  Transportation Needs: No Transportation Needs (09/27/2022)   PRAPARE - Administrator, Civil Service (Medical): No    Lack of Transportation (Non-Medical): No  Physical Activity: Not on file  Stress: Not on file  Social Connections: Not on file  Intimate Partner Violence: Not on file   PHYSICAL EXAM  Vitals:   11/25/22 0756 11/25/22 0809  BP: (!) 159/84 138/70  Pulse: 72   Weight: 184 lb 8 oz (83.7 kg)   Height: 5' 11.75" (1.822 m)    Body mass index is 25.2 kg/m.  Generalized:  Well developed, in no acute distress  Neurological examination  Mentation: Alert oriented to time, place, history taking. Follows all commands speech and language fluent Cranial nerve II-XII: Pupils were equal round reactive to light. Extraocular movements were full, visual field were full on confrontational test. Facial sensation and strength were normal. Head turning and shoulder shrug  were normal and symmetric. Motor: The motor testing reveals 5 over 5 strength of all 4 extremities. Good symmetric motor tone is noted throughout.  Sensory: Sensory testing is intact to soft touch on all 4 extremities. No evidence of extinction is noted.  Coordination: Cerebellar testing reveals good finger-nose-finger and heel-to-shin bilaterally.  Gait and station: Gait is normal, ankles rotate inward, gait is wide-based, has walking stick.  But is steady overall. Reflexes: Deep tendon reflexes are symmetric and normal bilaterally.   DIAGNOSTIC DATA (LABS, IMAGING, TESTING) - I reviewed patient records, labs, notes, testing and imaging myself where available.  Lab Results  Component Value Date   WBC 10.1 09/28/2022   HGB 14.7 09/28/2022   HCT 43.2 09/28/2022   MCV 90.2 09/28/2022   PLT 329 09/28/2022      Component Value Date/Time   NA 133 (L) 09/28/2022 0341   NA 138  03/24/2021 0850   K 3.7 09/28/2022 0341   CL 101 09/28/2022 0341   CO2 24 09/28/2022 0341   GLUCOSE 106 (H) 09/28/2022 0341   BUN 10 09/28/2022 0341   BUN 10 03/24/2021 0850   CREATININE 0.75 09/28/2022 0341   CALCIUM 8.7 (L) 09/28/2022 0341   PROT 7.2 09/27/2022 1304   PROT 7.0 03/24/2021 0850   ALBUMIN 4.2 09/27/2022 1304   ALBUMIN 4.7 03/24/2021 0850   AST 27 09/27/2022 1304   ALT 24 09/27/2022 1304   ALKPHOS 69 09/27/2022 1304   BILITOT 0.9 09/27/2022 1304   BILITOT 0.5 03/24/2021 0850   GFRNONAA >60 09/28/2022 0341   GFRAA 103 05/01/2018 0849   Lab Results  Component Value Date   CHOL 148 09/28/2022   HDL 66 09/28/2022   LDLCALC 66 09/28/2022   TRIG 82 09/28/2022   CHOLHDL 2.2 09/28/2022   Lab Results  Component Value Date   HGBA1C 5.4 09/27/2022   Lab Results  Component Value Date   VITAMINB12 279 09/27/2022   Lab Results  Component Value Date   TSH 2.087 06/04/2016    Margie Ege, AGNP-C, DNP 11/25/2022, 8:25 AM [facility] [address] [place], [place] [address] [phone]

## 2022-11-29 NOTE — Progress Notes (Signed)
I agree with the above plan 

## 2022-12-08 DIAGNOSIS — R413 Other amnesia: Secondary | ICD-10-CM | POA: Diagnosis not present

## 2022-12-08 DIAGNOSIS — I77819 Aortic ectasia, unspecified site: Secondary | ICD-10-CM | POA: Diagnosis not present

## 2022-12-08 DIAGNOSIS — I700 Atherosclerosis of aorta: Secondary | ICD-10-CM | POA: Diagnosis not present

## 2022-12-08 DIAGNOSIS — Z79899 Other long term (current) drug therapy: Secondary | ICD-10-CM | POA: Diagnosis not present

## 2022-12-08 DIAGNOSIS — I779 Disorder of arteries and arterioles, unspecified: Secondary | ICD-10-CM | POA: Diagnosis not present

## 2022-12-08 DIAGNOSIS — Z23 Encounter for immunization: Secondary | ICD-10-CM | POA: Diagnosis not present

## 2022-12-16 NOTE — Progress Notes (Deleted)
Cardiology Office Note:  .   Date:  12/16/2022  ID:  Justin Sutton, DOB 1946/02/06, MRN [id number] PCP: Darrow Bussing, MD  [facility] Providers Cardiologist:  Chilton Si, MD { Click to update primary MD,subspecialty MD or APP then REFRESH:1}   History of Present Illness: .   Justin Sutton is a 77 y.o. male *** with hx of CVA, HTN, HLD, aortic stenosis, mild aortic stenosis, ascending aortic aneurysm, coronary calcification, PAD, seizures, asbestos exposure, prior etoh abuse, carotid stenosis.   Initially seen 2016 for clearance for knee surgery with inadvertent finding of coronary calcification on non contrast CT. Echo 01/2015 mild aortic stenosis mean gradient and mild ascending aortic aneurysm. Repeat echo 06/2019 EF 55-60%, gr1DD, ascending aorta 4.5 cm and AV mean gradient 12 mmHg but dimensionless index 0.43. Echo 12/2020 mild aortic valve stenosis (mean gradient 10 mmHg), mild dilation ascending aorta 42mm. Echo 12/2021 normal LVEF, mild AS (mean gradient 9 mmHg), moderate dilation ascending aorta 46mm.  Admitted 6/23-6/25/24 after presently with progressive weakness, amnesia. UDS positive for THC. 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 Carotid duplex bilateral 1-39% stenosis. Echo LVEF 55-60%, mild MR, mild AS (mean gradient 12 mmHg), moderate ascending aorta dilation 44mm. Recommended for Aspirin and Plavix x 3 months then Plavix monotherapy. Atorvastatin was resumed as he had not been taking.     ROS: Please see the history of present illness.    All other systems reviewed and are negative.   Studies Reviewed: .        Cardiac Studies & Procedures     STRESS TESTS  MYOCARDIAL PERFUSION IMAGING 06/02/2018  Narrative  Nuclear stress EF: 50%. Visually, the EF appears to be 55-60%.  This is a low risk study.  There is no ischemia . no evidence of previous infarction  The study is normal.   ECHOCARDIOGRAM  ECHOCARDIOGRAM COMPLETE 09/28/2022  Narrative ECHOCARDIOGRAM REPORT    Patient Name:   Justin Sutton Date of Exam: 09/28/2022 Medical Rec #:  [id number]        Height:       71.0 in Accession #:    [id number]       Weight:       175.5 lb Date of Birth:  02-Apr-1946        BSA:          1.995 m Patient Age:    77 years         BP:           140/104 mmHg Patient Gender: M                HR:           80 bpm. Exam Location:  Inpatient  Procedure: 2D Echo, Cardiac Doppler and Color Doppler  Indications:    TIA  History:        Patient has prior history of Echocardiogram examinations, most recent 01/04/2022. Aortic Valve Disease, Arrythmias:PVC; Risk Factors:Sleep Apnea, Hypertension and Dyslipidemia. TAA, asbestos exposure.  Sonographer:    Milda Smart Referring Phys: [id number] DAVID MANUEL ORTIZ   Sonographer Comments: Image acquisition challenging due to patient body habitus and Image acquisition challenging due to respiratory motion. IMPRESSIONS   1. Left ventricular ejection fraction, by estimation, is 55 to 60%. The left ventricle has normal function. The left ventricle has no regional wall motion abnormalities. There  is mild concentric left ventricular hypertrophy. Left ventricular diastolic parameters are indeterminate. 2. Right ventricular systolic function is mildly reduced. The right ventricular size is normal. 3. The mitral valve is normal in structure. Mild mitral valve regurgitation. No evidence of mitral stenosis. 4. The aortic valve is normal in structure. Aortic valve regurgitation is trivial. Mild aortic valve stenosis. Aortic valve area, by VTI measures 2.03 cm. Aortic valve mean gradient measures 12.0 mmHg. Aortic valve Vmax measures 2.26 m/s. 5. There is mild dilatation of the aortic root, measuring 38 mm. There is moderate dilatation of the ascending aorta,  measuring 44 mm. 6. The inferior vena cava is normal in size with greater than 50% respiratory variability, suggesting right atrial pressure of 3 mmHg.  FINDINGS Left Ventricle: Left ventricular ejection fraction, by estimation, is 55 to 60%. The left ventricle has normal function. The left ventricle has no regional wall motion abnormalities. The left ventricular internal cavity size was normal in size. There is mild concentric left ventricular hypertrophy. Left ventricular diastolic parameters are indeterminate.  Right Ventricle: The right ventricular size is normal. No increase in right ventricular wall thickness. Right ventricular systolic function is mildly reduced.  Left Atrium: Left atrial size was normal in size.  Right Atrium: Right atrial size was normal in size.  Pericardium: There is no evidence of pericardial effusion.  Mitral Valve: The mitral valve is normal in structure. Mild mitral valve regurgitation. No evidence of mitral valve stenosis.  Tricuspid Valve: The tricuspid valve is normal in structure. Tricuspid valve regurgitation is not demonstrated. No evidence of tricuspid stenosis.  Aortic Valve: The aortic valve is normal in structure. Aortic valve regurgitation is trivial. Mild aortic stenosis is present. Aortic valve mean gradient measures 12.0 mmHg. Aortic valve peak gradient measures 20.4 mmHg. Aortic valve area, by VTI measures 2.03 cm.  Pulmonic Valve: The pulmonic valve was normal in structure. Pulmonic valve regurgitation is not visualized. No evidence of pulmonic stenosis.  Aorta: There is mild dilatation of the aortic root, measuring 38 mm. There is moderate dilatation of the ascending aorta, measuring 44 mm.  Venous: The inferior vena cava is normal in size with greater than 50% respiratory variability, suggesting right atrial pressure of 3 mmHg.  IAS/Shunts: No atrial level shunt detected by color flow Doppler.   LEFT VENTRICLE PLAX 2D LVIDd:          3.70 cm     Diastology LVIDs:         2.40 cm     LV e' medial:  8.05 cm/s LV PW:         1.10 cm     LV e' lateral: 8.70 cm/s LV IVS:        1.10 cm LVOT diam:     2.30 cm LV SV:         81 LV SV Index:   41 LVOT Area:     4.15 cm  LV Volumes (MOD) LV vol d, MOD A2C: 86.9 ml LV vol d, MOD A4C: 90.5 ml LV vol s, MOD A2C: 36.8 ml LV vol s, MOD A4C: 31.4 ml LV SV MOD A2C:     50.1 ml LV SV MOD A4C:     90.5 ml LV SV MOD BP:      56.7 ml  RIGHT VENTRICLE            IVC RV Basal diam:  3.80 cm    IVC diam: 1.10 cm RV Mid diam:    2.90  cm RV S prime:     7.72 cm/s TAPSE (M-mode): 1.5 cm  LEFT ATRIUM             Index        RIGHT ATRIUM           Index LA diam:        4.10 cm 2.06 cm/m   RA Area:     14.00 cm LA Vol (A2C):   41.4 ml 20.76 ml/m  RA Volume:   29.50 ml  14.79 ml/m LA Vol (A4C):   33.8 ml 16.92 ml/m LA Biplane Vol: 41.8 ml 20.96 ml/m AORTIC VALVE AV Area (Vmax):    1.86 cm AV Area (Vmean):   1.89 cm AV Area (VTI):     2.03 cm AV Vmax:           226.00 cm/s AV Vmean:          162.000 cm/s AV VTI:            0.402 m AV Peak Grad:      20.4 mmHg AV Mean Grad:      12.0 mmHg LVOT Vmax:         101.00 cm/s LVOT Vmean:        73.600 cm/s LVOT VTI:          0.196 m LVOT/AV VTI ratio: 0.49  AORTA Ao Root diam: 3.80 cm Ao Asc diam:  4.40 cm  MR Peak grad: 24.0 mmHg MR Vmax:      245.00 cm/s SHUNTS Systemic VTI:  0.20 m Systemic Diam: 2.30 cm  Kardie Tobb DO Electronically signed by Thomasene Ripple DO Signature Date/Time: 09/28/2022/11:59:08 AM    Final             Risk Assessment/Calculations:   {Does this patient have ATRIAL FIBRILLATION?[phone]} No BP recorded.  {Refresh Note OR Click here to enter BP  :1}***       Physical Exam:   VS:  There were no vitals taken for this visit.   Wt Readings from Last 3 Encounters:  11/25/22 184 lb 8 oz (83.7 kg)  09/27/22 175 lb 7.8 oz (79.6 kg)  05/04/22 172 lb 8 oz (78.2 kg)    GEN: Well nourished,  well developed in no acute distress NECK: No JVD; No carotid bruits CARDIAC: ***RRR, no murmurs, rubs, gallops RESPIRATORY:  Clear to auscultation without rales, wheezing or rhonchi  ABDOMEN: Soft, non-tender, non-distended EXTREMITIES:  No edema; No deformity   ASSESSMENT AND PLAN: .    HTN -  Coronary calcification / HLD, LDL goal <70 -  Hx of CVA -  Mild AS -  Aortic root dilation / Ascending aortic aneurysm -     {Are you ordering a CV Procedure (e.g. stress test, cath, DCCV, TEE, etc)?   Press F2        :[id number]}  Dispo: ***  Signed, Alver Sorrow, NP

## 2022-12-17 ENCOUNTER — Ambulatory Visit (HOSPITAL_BASED_OUTPATIENT_CLINIC_OR_DEPARTMENT_OTHER): Payer: Medicare Other | Admitting: Family

## 2023-01-03 ENCOUNTER — Ambulatory Visit (HOSPITAL_BASED_OUTPATIENT_CLINIC_OR_DEPARTMENT_OTHER): Payer: Medicare Other

## 2023-01-03 ENCOUNTER — Encounter (HOSPITAL_BASED_OUTPATIENT_CLINIC_OR_DEPARTMENT_OTHER): Payer: Self-pay

## 2023-01-10 ENCOUNTER — Encounter (HOSPITAL_BASED_OUTPATIENT_CLINIC_OR_DEPARTMENT_OTHER): Payer: Self-pay | Admitting: Family

## 2023-01-10 ENCOUNTER — Ambulatory Visit (HOSPITAL_BASED_OUTPATIENT_CLINIC_OR_DEPARTMENT_OTHER): Payer: Medicare Other | Admitting: Family

## 2023-01-10 VITALS — BP 122/84 | HR 69 | Ht 71.0 in | Wt 185.4 lb

## 2023-01-10 DIAGNOSIS — I7121 Aneurysm of the ascending aorta, without rupture: Secondary | ICD-10-CM

## 2023-01-10 DIAGNOSIS — I10 Essential (primary) hypertension: Secondary | ICD-10-CM | POA: Diagnosis not present

## 2023-01-10 DIAGNOSIS — I350 Nonrheumatic aortic (valve) stenosis: Secondary | ICD-10-CM

## 2023-01-10 DIAGNOSIS — I25118 Atherosclerotic heart disease of native coronary artery with other forms of angina pectoris: Secondary | ICD-10-CM

## 2023-01-10 DIAGNOSIS — Z8673 Personal history of transient ischemic attack (TIA), and cerebral infarction without residual deficits: Secondary | ICD-10-CM | POA: Diagnosis not present

## 2023-01-10 DIAGNOSIS — E785 Hyperlipidemia, unspecified: Secondary | ICD-10-CM

## 2023-01-10 NOTE — Progress Notes (Signed)
Cardiology Office Note:  .   Date:  01/10/2023  ID:  Justin Sutton, DOB [birth year], MRN [id number] PCP: Darrow Bussing, MD  [facility] Providers Cardiologist:  Chilton Si, MD    History of Present Illness: .   Justin Sutton is a 77 y.o. male  with hx of CVA, HTN, HLD, aortic stenosis, mild aortic stenosis, ascending aortic aneurysm, coronary calcification, PAD, seizures, asbestos exposure, prior etoh abuse, carotid stenosis.   Initially seen 2016 for clearance for knee surgery with inadvertent finding of coronary calcification on non contrast CT. Echo 01/2015 mild aortic stenosis mean gradient and mild ascending aortic aneurysm. Repeat echo 06/2019 EF 55-60%, gr1DD, ascending aorta 4.5 cm and AV mean gradient 12 mmHg but dimensionless index 0.43. Echo 12/2020 mild aortic valve stenosis (mean gradient 10 mmHg), mild dilation ascending aorta 42mm. Echo 12/2021 normal LVEF, mild AS (mean gradient 9 mmHg), moderate dilation ascending aorta 46mm.  Admitted 6/23-6/25/24 after presently with progressive weakness, amnesia. UDS positive for THC. 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 Carotid duplex bilateral 1-39% stenosis. Echo LVEF 55-60%, mild MR, mild AS (mean gradient 12 mmHg), moderate ascending aorta dilation 44mm. Recommended for Aspirin and Plavix x 3 months then Plavix monotherapy. Atorvastatin was resumed as he had not been taking.   Presents today for follow up. Has returned to most of his regular walking regimen. BP has been well controlled at home. Reports no shortness of breath nor dyspnea on exertion. Reports no chest pain, pressure, or tightness. No edema, orthopnea, PND. Reports no palpitations.  Endorses cooking at home and following a low salt diet.   ROS: Please see the history of present illness.    All other systems  reviewed and are negative.   Studies Reviewed: .        Cardiac Studies & Procedures     STRESS TESTS  MYOCARDIAL PERFUSION IMAGING 06/02/2018  Narrative  Nuclear stress EF: 50%. Visually, the EF appears to be 55-60%.  This is a low risk study. There is no ischemia . no evidence of previous infarction  The study is normal.   ECHOCARDIOGRAM  ECHOCARDIOGRAM COMPLETE 09/28/2022  Narrative ECHOCARDIOGRAM REPORT    Patient Name:   Justin Sutton Date of Exam: 09/28/2022 Medical Rec #:  [id number]        Height:       71.0 in Accession #:    [id number]       Weight:       175.5 lb Date of Birth:  27-Jun-1945        BSA:          1.995 m Patient Age:    77 years         BP:           140/104 mmHg Patient Gender: M                HR:           80 bpm. Exam Location:  Inpatient  Procedure: 2D Echo, Cardiac Doppler and Color Doppler  Indications:    TIA  History:        Patient has prior history of Echocardiogram examinations, most recent 01/04/2022. Aortic Valve Disease, Arrythmias:PVC; Risk Factors:Sleep Apnea, Hypertension and Dyslipidemia. TAA, asbestos exposure.  Sonographer:    Milda Smart Referring Phys: [id number] DAVID MANUEL ORTIZ   Sonographer Comments: Image  acquisition challenging due to patient body habitus and Image acquisition challenging due to respiratory motion. IMPRESSIONS   1. Left ventricular ejection fraction, by estimation, is 55 to 60%. The left ventricle has normal function. The left ventricle has no regional wall motion abnormalities. There is mild concentric left ventricular hypertrophy. Left ventricular diastolic parameters are indeterminate. 2. Right ventricular systolic function is mildly reduced. The right ventricular size is normal. 3. The mitral valve is normal in structure. Mild mitral valve regurgitation. No evidence of mitral stenosis. 4. The aortic valve is normal in structure. Aortic valve regurgitation is trivial. Mild aortic valve  stenosis. Aortic valve area, by VTI measures 2.03 cm. Aortic valve mean gradient measures 12.0 mmHg. Aortic valve Vmax measures 2.26 m/s. 5. There is mild dilatation of the aortic root, measuring 38 mm. There is moderate dilatation of the ascending aorta, measuring 44 mm. 6. The inferior vena cava is normal in size with greater than 50% respiratory variability, suggesting right atrial pressure of 3 mmHg.  FINDINGS Left Ventricle: Left ventricular ejection fraction, by estimation, is 55 to 60%. The left ventricle has normal function. The left ventricle has no regional wall motion abnormalities. The left ventricular internal cavity size was normal in size. There is mild concentric left ventricular hypertrophy. Left ventricular diastolic parameters are indeterminate.  Right Ventricle: The right ventricular size is normal. No increase in right ventricular wall thickness. Right ventricular systolic function is mildly reduced.  Left Atrium: Left atrial size was normal in size.  Right Atrium: Right atrial size was normal in size.  Pericardium: There is no evidence of pericardial effusion.  Mitral Valve: The mitral valve is normal in structure. Mild mitral valve regurgitation. No evidence of mitral valve stenosis.  Tricuspid Valve: The tricuspid valve is normal in structure. Tricuspid valve regurgitation is not demonstrated. No evidence of tricuspid stenosis.  Aortic Valve: The aortic valve is normal in structure. Aortic valve regurgitation is trivial. Mild aortic stenosis is present. Aortic valve mean gradient measures 12.0 mmHg. Aortic valve peak gradient measures 20.4 mmHg. Aortic valve area, by VTI measures 2.03 cm.  Pulmonic Valve: The pulmonic valve was normal in structure. Pulmonic valve regurgitation is not visualized. No evidence of pulmonic stenosis.  Aorta: There is mild dilatation of the aortic root, measuring 38 mm. There is moderate dilatation of the ascending aorta, measuring 44  mm.  Venous: The inferior vena cava is normal in size with greater than 50% respiratory variability, suggesting right atrial pressure of 3 mmHg.  IAS/Shunts: No atrial level shunt detected by color flow Doppler.   LEFT VENTRICLE PLAX 2D LVIDd:         3.70 cm     Diastology LVIDs:         2.40 cm     LV e' medial:  8.05 cm/s LV PW:         1.10 cm     LV e' lateral: 8.70 cm/s LV IVS:        1.10 cm LVOT diam:     2.30 cm LV SV:         81 LV SV Index:   41 LVOT Area:     4.15 cm  LV Volumes (MOD) LV vol d, MOD A2C: 86.9 ml LV vol d, MOD A4C: 90.5 ml LV vol s, MOD A2C: 36.8 ml LV vol s, MOD A4C: 31.4 ml LV SV MOD A2C:     50.1 ml LV SV MOD A4C:     90.5 ml LV  SV MOD BP:      56.7 ml  RIGHT VENTRICLE            IVC RV Basal diam:  3.80 cm    IVC diam: 1.10 cm RV Mid diam:    2.90 cm RV S prime:     7.72 cm/s TAPSE (M-mode): 1.5 cm  LEFT ATRIUM             Index        RIGHT ATRIUM           Index LA diam:        4.10 cm 2.06 cm/m   RA Area:     14.00 cm LA Vol (A2C):   41.4 ml 20.76 ml/m  RA Volume:   29.50 ml  14.79 ml/m LA Vol (A4C):   33.8 ml 16.92 ml/m LA Biplane Vol: 41.8 ml 20.96 ml/m AORTIC VALVE AV Area (Vmax):    1.86 cm AV Area (Vmean):   1.89 cm AV Area (VTI):     2.03 cm AV Vmax:           226.00 cm/s AV Vmean:          162.000 cm/s AV VTI:            0.402 m AV Peak Grad:      20.4 mmHg AV Mean Grad:      12.0 mmHg LVOT Vmax:         101.00 cm/s LVOT Vmean:        73.600 cm/s LVOT VTI:          0.196 m LVOT/AV VTI ratio: 0.49  AORTA Ao Root diam: 3.80 cm Ao Asc diam:  4.40 cm  MR Peak grad: 24.0 mmHg MR Vmax:      245.00 cm/s SHUNTS Systemic VTI:  0.20 m Systemic Diam: 2.30 cm  Kardie Tobb DO Electronically signed by Thomasene Ripple DO Signature Date/Time: 09/28/2022/11:59:08 AM    Final                Risk Assessment/Calculations:             Physical Exam:   VS:  BP 122/84   Pulse 69   Ht 5\' 11"  (1.803 m)   Wt 185 lb 6.4  oz (84.1 kg)   SpO2 96%   BMI 25.86 kg/m    Wt Readings from Last 3 Encounters:  01/10/23 185 lb 6.4 oz (84.1 kg)  11/25/22 184 lb 8 oz (83.7 kg)  09/27/22 175 lb 7.8 oz (79.6 kg)    GEN: Well nourished, well developed in no acute distress NECK: No JVD; No carotid bruits CARDIAC: RRR, no murmurs, rubs, gallops RESPIRATORY:  Clear to auscultation without rales, wheezing or rhonchi  ABDOMEN: Soft, non-tender, non-distended EXTREMITIES:  No edema; No deformity   ASSESSMENT AND PLAN: .    HTN - BP well controlled. Continue current antihypertensive regimen.    Coronary calcification / HLD, LDL goal <70 - 09/2022 LDL 66. Stable with no anginal symptoms. No indication for ischemic evaluation.  GDMT includes Atorvastatin, plavix. Recommend aiming for 150 minutes of moderate intensity activity per week and following a heart healthy diet.    Hx of CVA - Continue BP and lipid control, as above.   Mild AS - Stable by echo 09/2022. BP control, as above. Repeat echo 09/2023 for monitoring.   Aortic root dilation / Ascending aortic aneurysm - 09/2022 aortic root 38mm and ascending aorta 44mm. Prior echos: 01/2022 aortic  root 38mm, ascending aorta 46mm - 12/2020 aortic root 38mm, ascending aorta 42mm - 06/2019 aortic root 38mm, ascending aorta 45mm  Update echo due 09/2023        Dispo: follow up after echocardiogram 09/2023  Signed, Alver Sorrow, NP

## 2023-01-10 NOTE — Patient Instructions (Signed)
Medication Instructions:  Continue your current medications.  *If you need a refill on your cardiac medications before your next appointment, please call your pharmacy*  Testing/Procedures: Your physician has requested that you have an echocardiogram  June 2025. Echocardiography is a painless test that uses sound waves to create images of your heart. It provides your doctor with information about the size and shape of your heart and how well your heart's chambers and valves are working. This procedure takes approximately one hour. There are no restrictions for this procedure. Please do NOT wear cologne, perfume, aftershave, or lotions (deodorant is allowed). Please arrive 15 minutes prior to your appointment time.    Follow-Up: At [facility], you and your health needs are our priority.  As part of our continuing mission to provide you with exceptional heart care, we have created designated Provider Care Teams.  These Care Teams include your primary Cardiologist (physician) and Advanced Practice Providers (APPs -  Physician Assistants and Nurse Practitioners) who all work together to provide you with the care you need, when you need it.  We recommend signing up for the patient portal called "MyChart".  Sign up information is provided on this After Visit Summary.  MyChart is used to connect with patients for Virtual Visits (Telemedicine).  Patients are able to view lab/test results, encounter notes, upcoming appointments, etc.  Non-urgent messages can be sent to your provider as well.   To learn more about what you can do with MyChart, go to [email or web address].    Your next appointment:   After echocardiogram 09/2023  Provider:   Chilton Si, MD or Gillian Shields, NP    Other Instructions Heart Healthy Diet Recommendations: A low-salt diet is recommended. Meats should be grilled, baked, or boiled. Avoid fried foods. Focus on lean protein sources like fish or chicken with  vegetables and fruits. The American Heart Association is a Chief Technology Officer!  American Heart Association Diet and Lifeystyle Recommendations   Exercise recommendations: The American Heart Association recommends 150 minutes of moderate intensity exercise weekly. Try 30 minutes of moderate intensity exercise 4-5 times per week. This could include walking, jogging, or swimming.

## 2023-02-12 DIAGNOSIS — H34832 Tributary (branch) retinal vein occlusion, left eye, with macular edema: Secondary | ICD-10-CM | POA: Diagnosis not present

## 2023-03-01 DIAGNOSIS — H34832 Tributary (branch) retinal vein occlusion, left eye, with macular edema: Secondary | ICD-10-CM | POA: Diagnosis not present

## 2023-03-03 ENCOUNTER — Other Ambulatory Visit (HOSPITAL_BASED_OUTPATIENT_CLINIC_OR_DEPARTMENT_OTHER): Payer: Self-pay | Admitting: Family

## 2023-03-03 DIAGNOSIS — I25118 Atherosclerotic heart disease of native coronary artery with other forms of angina pectoris: Secondary | ICD-10-CM

## 2023-03-03 DIAGNOSIS — E785 Hyperlipidemia, unspecified: Secondary | ICD-10-CM

## 2023-03-04 ENCOUNTER — Other Ambulatory Visit (HOSPITAL_BASED_OUTPATIENT_CLINIC_OR_DEPARTMENT_OTHER): Payer: Self-pay | Admitting: Family

## 2023-03-04 DIAGNOSIS — I25118 Atherosclerotic heart disease of native coronary artery with other forms of angina pectoris: Secondary | ICD-10-CM

## 2023-03-04 DIAGNOSIS — E785 Hyperlipidemia, unspecified: Secondary | ICD-10-CM

## 2023-04-04 ENCOUNTER — Other Ambulatory Visit: Payer: Self-pay | Admitting: *Deleted

## 2023-04-04 ENCOUNTER — Telehealth (HOSPITAL_BASED_OUTPATIENT_CLINIC_OR_DEPARTMENT_OTHER): Payer: Self-pay | Admitting: Cardiovascular Disease

## 2023-04-04 DIAGNOSIS — E785 Hyperlipidemia, unspecified: Secondary | ICD-10-CM

## 2023-04-04 DIAGNOSIS — I25118 Atherosclerotic heart disease of native coronary artery with other forms of angina pectoris: Secondary | ICD-10-CM

## 2023-04-04 MED ORDER — ATORVASTATIN CALCIUM 80 MG PO TABS
80.0000 mg | ORAL_TABLET | Freq: Every day | ORAL | 3 refills | Status: DC
Start: 2023-04-04 — End: 2024-02-17

## 2023-04-04 MED ORDER — METOPROLOL SUCCINATE ER 25 MG PO TB24
25.0000 mg | ORAL_TABLET | Freq: Every day | ORAL | 3 refills | Status: DC
Start: 2023-04-04 — End: 2023-10-10

## 2023-04-04 NOTE — Telephone Encounter (Signed)
*  STAT* If patient is at the pharmacy, call can be transferred to refill team.   1. Which medications need to be refilled? (please list name of each medication and dose if known)  atorvastatin (LIPITOR) 80 MG tablet metoprolol succinate (TOPROL XL) 25 MG 24 hr tablet  2. Which pharmacy/location (including street and city if local pharmacy) is medication to be sent to? [facility]   3. Do they need a 30 day or 90 day supply?  90 day supply

## 2023-04-13 DIAGNOSIS — H34832 Tributary (branch) retinal vein occlusion, left eye, with macular edema: Secondary | ICD-10-CM | POA: Diagnosis not present

## 2023-04-15 DIAGNOSIS — R7301 Impaired fasting glucose: Secondary | ICD-10-CM | POA: Diagnosis not present

## 2023-04-15 DIAGNOSIS — M1990 Unspecified osteoarthritis, unspecified site: Secondary | ICD-10-CM | POA: Diagnosis not present

## 2023-04-15 DIAGNOSIS — Z0001 Encounter for general adult medical examination with abnormal findings: Secondary | ICD-10-CM | POA: Diagnosis not present

## 2023-04-15 DIAGNOSIS — E7800 Pure hypercholesterolemia, unspecified: Secondary | ICD-10-CM | POA: Diagnosis not present

## 2023-04-15 DIAGNOSIS — Z1331 Encounter for screening for depression: Secondary | ICD-10-CM | POA: Diagnosis not present

## 2023-04-15 DIAGNOSIS — I77819 Aortic ectasia, unspecified site: Secondary | ICD-10-CM | POA: Diagnosis not present

## 2023-04-15 DIAGNOSIS — F5101 Primary insomnia: Secondary | ICD-10-CM | POA: Diagnosis not present

## 2023-04-15 DIAGNOSIS — Z8673 Personal history of transient ischemic attack (TIA), and cerebral infarction without residual deficits: Secondary | ICD-10-CM | POA: Diagnosis not present

## 2023-04-15 DIAGNOSIS — Z79899 Other long term (current) drug therapy: Secondary | ICD-10-CM | POA: Diagnosis not present

## 2023-04-26 DIAGNOSIS — Z0001 Encounter for general adult medical examination with abnormal findings: Secondary | ICD-10-CM | POA: Diagnosis not present

## 2023-04-28 DIAGNOSIS — Z23 Encounter for immunization: Secondary | ICD-10-CM | POA: Diagnosis not present

## 2023-05-04 ENCOUNTER — Telehealth: Payer: Self-pay | Admitting: Neurology

## 2023-05-04 NOTE — Telephone Encounter (Signed)
Reschedule appointment due to scheduling conflict.

## 2023-05-10 ENCOUNTER — Ambulatory Visit: Payer: Medicare Other | Admitting: Neurology

## 2023-05-11 ENCOUNTER — Ambulatory Visit: Payer: Medicare Other | Admitting: Neurology

## 2023-05-11 DIAGNOSIS — H34832 Tributary (branch) retinal vein occlusion, left eye, with macular edema: Secondary | ICD-10-CM | POA: Diagnosis not present

## 2023-06-22 DIAGNOSIS — H34832 Tributary (branch) retinal vein occlusion, left eye, with macular edema: Secondary | ICD-10-CM | POA: Diagnosis not present

## 2023-07-20 DIAGNOSIS — H34832 Tributary (branch) retinal vein occlusion, left eye, with macular edema: Secondary | ICD-10-CM | POA: Diagnosis not present

## 2023-07-28 ENCOUNTER — Other Ambulatory Visit (HOSPITAL_COMMUNITY): Payer: Self-pay

## 2023-08-31 DIAGNOSIS — H34832 Tributary (branch) retinal vein occlusion, left eye, with macular edema: Secondary | ICD-10-CM | POA: Diagnosis not present

## 2023-09-05 ENCOUNTER — Ambulatory Visit (INDEPENDENT_AMBULATORY_CARE_PROVIDER_SITE_OTHER): Payer: Medicare Other

## 2023-09-05 DIAGNOSIS — I350 Nonrheumatic aortic (valve) stenosis: Secondary | ICD-10-CM | POA: Diagnosis not present

## 2023-09-05 LAB — ECHOCARDIOGRAM COMPLETE
AR max vel: 0.92 cm2
AV Area VTI: 0.96 cm2
AV Area mean vel: 0.87 cm2
AV Mean grad: 14 mmHg
AV Peak grad: 26.4 mmHg
Ao pk vel: 2.57 m/s
Area-P 1/2: 3.37 cm2
S' Lateral: 2.84 cm

## 2023-09-08 ENCOUNTER — Ambulatory Visit (HOSPITAL_BASED_OUTPATIENT_CLINIC_OR_DEPARTMENT_OTHER): Payer: Self-pay | Admitting: Family

## 2023-09-08 DIAGNOSIS — I7121 Aneurysm of the ascending aorta, without rupture: Secondary | ICD-10-CM

## 2023-09-08 DIAGNOSIS — I350 Nonrheumatic aortic (valve) stenosis: Secondary | ICD-10-CM

## 2023-10-06 ENCOUNTER — Encounter: Payer: Self-pay | Admitting: Neurology

## 2023-10-06 ENCOUNTER — Ambulatory Visit: Payer: Medicare Other | Admitting: Neurology

## 2023-10-06 VITALS — BP 145/93 | HR 64 | Ht 68.0 in | Wt 179.5 lb

## 2023-10-06 DIAGNOSIS — I10 Essential (primary) hypertension: Secondary | ICD-10-CM | POA: Diagnosis not present

## 2023-10-06 DIAGNOSIS — I639 Cerebral infarction, unspecified: Secondary | ICD-10-CM

## 2023-10-06 DIAGNOSIS — R569 Unspecified convulsions: Secondary | ICD-10-CM | POA: Diagnosis not present

## 2023-10-06 MED ORDER — LEVETIRACETAM 500 MG PO TABS: ORAL_TABLET | ORAL | 3 refills | Status: DC

## 2023-10-06 NOTE — Progress Notes (Signed)
 Patient: Justin Sutton Date of Birth: 07/24/1945  Reason for Visit: Follow up for stroke  History from: Patient, wife  Primary Neurologist: Rosemarie   ASSESSMENT AND PLAN 78 y.o. 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  History of well-controlled seizure disorder on Keppra .  Vascular risk factors: HTN, HLD.  No residual deficits. No seizures since November 2018.  - Continue Plavix  75 mg daily for secondary stroke prevention - Continue Keppra  500 mg twice a day for seizure prevention - Strict management of vascular risk factors with a goal BP less than 130/90, A1c less than 7.0, LDL less than 70 for secondary stroke prevention - Continue exercise, healthy eating  - Follow-up in 1 year or sooner if needed  Meds ordered this encounter  Medications   levETIRAcetam  (KEPPRA ) 500 MG tablet    Sig: TAKE 1 TABLET BY MOUTH TWICE A DAY    Dispense:  180 tablet    Refill:  3   I personally spent a total of 40 minutes in the care of the patient today including preparing to see the patient, getting/reviewing separately obtained history, performing a medically appropriate exam/evaluation, counseling and educating, placing orders, documenting clinical information in the EHR, communicating results, and coordinating care.  HISTORY OF PRESENT ILLNESS: Today 10/06/23 Here with his wife. Walks daily for exercise. No falls, carries his cane. Remains on Plavix  75 mg daily, off aspirin . Tolerating Plavix  well. No seizures, still taking Keppra  500 mg BID. No smoking or drinking, stopped marijuana. No neuro issues. Very little driving, doesn't enjoy it. Gets a little moody occasionally at night, he gets tired and goes to bed at 7:30, gets up at 3 AM. BP was a little elevated today 145/93 at home runs's 130/80's. On Norvasc , Lipitor, metoprolol . Last seizure was in Nov  2018.  Labs from PCP in January 2025 LDL 76, A1c 5.8.  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  11/25/22 SS: Here with his wife. Doing well since CVA. Back to normal. No more dizziness, or gait instability. Has continued carrying his cane. On aspirin  81 and Plavix  75 mg daily. No bruising or bleeding. Remains on Lipitor 80. No seizures, still on Keppra  500 mg BID. Does not smoke or drink alcohol.  Does smoke marijuana on occasion.  We talked about anxiety/depression, some gets discouraged when not in control. In the past on Zoloft, he stopped it did not like how it made him feel.  He prefers not to drive, his wife does the driving.  He denies being evaluated for OSA, claims he would never use CPAP.  His wife reports occasional snoring but not significant.  HISTORY  Justin Sutton is an established patient of mine with well-controlled seizures on Keppra  500 mg twice daily.  Went to the ER 09/27/2022 for dizziness, n/v and imbalance.  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 Copied Hospital Imaging below:   CT head negative CTA head and neck bilateral ICA bulb, siphon and VA origin atherosclerosis.  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 MRI left cerebellum 3-4 small scattered infarcts. 2D Echo EF 55 to 60% Carotid Doppler abnormal left VA waveform LDL 66, continued on Lipitor 80 HgbA1c 5.4 UDS positive for THC SCDs for VTE prophylaxis aspirin   81 mg daily prior to admission, now on aspirin  81 mg daily and clopidogrel  75 mg daily for 3 months given intracranial stenosis and then Plavix  alone.  REVIEW OF SYSTEMS: Out of a complete 14 system review of symptoms, the patient complains only of the following symptoms, and all other reviewed systems are negative.  See HPI  ALLERGIES: No Known Allergies  HOME MEDICATIONS: Outpatient  Medications Prior to Visit  Medication Sig Dispense Refill   amLODipine  (NORVASC ) 5 MG tablet Take 1 tablet (5 mg total) by mouth daily. 30 tablet 3   atorvastatin  (LIPITOR) 80 MG tablet Take 1 tablet (80 mg total) by mouth daily. 90 tablet 3   clopidogrel  (PLAVIX ) 75 MG tablet Take 1 tablet (75 mg total) by mouth daily. 30 tablet 11   levETIRAcetam  (KEPPRA ) 500 MG tablet TAKE 1 TABLET BY MOUTH TWICE A DAY 180 tablet 3   metoprolol  succinate (TOPROL  XL) 25 MG 24 hr tablet Take 1 tablet (25 mg total) by mouth daily. 90 tablet 3   Multiple Vitamins-Minerals (MULTIVITAMIN MEN 50+) TABS Take 1 tablet by mouth daily.     traZODone  (DESYREL ) 50 MG tablet Take 50 mg by mouth at bedtime as needed for sleep.     No facility-administered medications prior to visit.    PAST MEDICAL HISTORY: Past Medical History:  Diagnosis Date   Arthritis    Chronic cough    Essential hypertension 02/14/2015   GERD (gastroesophageal reflux disease)    H/O asbestos exposure    Hearing loss    Heart murmur    Hyperlipidemia    Hypertension    Nocturia    Numbness    comes and goes per right hand    OSA (obstructive sleep apnea)    Thoracic aortic aneurysm without rupture (HCC) 02/14/2015   4.1 cm 01/2015    PAST SURGICAL HISTORY: Past Surgical History:  Procedure Laterality Date   CATARACT EXTRACTION  2011   bilat    HAND SURGERY Right 1983   HERNIA REPAIR  2011   TONSILLECTOMY     TOTAL KNEE ARTHROPLASTY Left 2011   TOTAL KNEE ARTHROPLASTY Right 04/14/2015   Procedure: RIGHT TOTAL KNEE ARTHROPLASTY;  Surgeon: Dempsey Moan, MD;  Location: [facility];  Service: Orthopedics;  Laterality: Right;    FAMILY HISTORY: Family History  Problem Relation Age of Onset   Asthma Mother    Heart disease Father    Prostate cancer Father     SOCIAL HISTORY: Social History   Socioeconomic History   Marital status: Married    Spouse name: cindy   Number of children: 3   Years of education: Not on file    Highest education level: Master's degree (e.g., MA, MS, MEng, MEd, MSW, MBA)  Occupational History   Occupation: Retired Cabin crew  Tobacco Use   Smoking status: Former    Current packs/day: 0.00    Average packs/day: 3.0 packs/day for 15.0 years (45.0 ttl pk-yrs)    Types: Cigarettes    Start date: 04/06/1955    Quit date: 04/05/1970    Years since quitting: 53.5   Smokeless tobacco: Never  Vaping Use   Vaping status: Never Used  Substance and Sexual Activity   Alcohol use: Not Currently    Comment: beer daily 2 per day    Drug use: No   Sexual activity: Not Currently    Birth control/protection: None  Other Topics Concern   Not on file  Social History Narrative   Epwort Sleepiness Scale =  1 (as of 02/14/15)   Social Drivers of Health   Financial Resource Strain: Not on file  Food Insecurity: No Food Insecurity (09/27/2022)   Hunger Vital Sign    Worried About Running Out of Food in the Last Year: Never true    Ran Out of Food in the Last Year: Never true  Transportation Needs: No Transportation Needs (09/27/2022)   PRAPARE - Administrator, Civil Service (Medical): No    Lack of Transportation (Non-Medical): No  Physical Activity: Not on file  Stress: Not on file  Social Connections: Not on file  Intimate Partner Violence: Not on file   PHYSICAL EXAM  Vitals:   10/06/23 0818 10/06/23 0822  BP: (!) 146/92 (!) 145/93  Pulse: 64   Weight: 179 lb 8 oz (81.4 kg)   Height: 5' 8 (1.727 m)    Body mass index is 27.29 kg/m.  Generalized: Well developed, in no acute distress  Neurological examination  Mentation: Alert oriented to time, place, history taking. Follows all commands speech and language fluent Cranial nerve II-XII: Pupils were equal round reactive to light. Extraocular movements were full, visual field were full on confrontational test. Facial sensation and strength were normal. Head turning and shoulder shrug  were normal and symmetric. Motor: The motor  testing reveals 5 over 5 strength of all 4 extremities. Good symmetric motor tone is noted throughout.  Sensory: Sensory testing is intact to soft touch on all 4 extremities. No evidence of extinction is noted.  Coordination: Cerebellar testing reveals good finger-nose-finger and heel-to-shin bilaterally.  Gait and station: Gait is wide-based, cautious, carries his cane Reflexes: Deep tendon reflexes are symmetric and normal bilaterally.   DIAGNOSTIC DATA (LABS, IMAGING, TESTING) - I reviewed patient records, labs, notes, testing and imaging myself where available.  Lab Results  Component Value Date   WBC 10.1 09/28/2022   HGB 14.7 09/28/2022   HCT 43.2 09/28/2022   MCV 90.2 09/28/2022   PLT 329 09/28/2022      Component Value Date/Time   NA 133 (L) 09/28/2022 0341   NA 138 03/24/2021 0850   K 3.7 09/28/2022 0341   CL 101 09/28/2022 0341   CO2 24 09/28/2022 0341   GLUCOSE 106 (H) 09/28/2022 0341   BUN 10 09/28/2022 0341   BUN 10 03/24/2021 0850   CREATININE 0.75 09/28/2022 0341   CALCIUM  8.7 (L) 09/28/2022 0341   PROT 7.2 09/27/2022 1304   PROT 7.0 03/24/2021 0850   ALBUMIN 4.2 09/27/2022 1304   ALBUMIN 4.7 03/24/2021 0850   AST 27 09/27/2022 1304   ALT 24 09/27/2022 1304   ALKPHOS 69 09/27/2022 1304   BILITOT 0.9 09/27/2022 1304   BILITOT 0.5 03/24/2021 0850   GFRNONAA >60 09/28/2022 0341   GFRAA 103 05/01/2018 0849   Lab Results  Component Value Date   CHOL 148 09/28/2022   HDL 66 09/28/2022   LDLCALC 66 09/28/2022   TRIG 82 09/28/2022   CHOLHDL 2.2 09/28/2022   Lab Results  Component Value Date   HGBA1C 5.4 09/27/2022   Lab Results  Component Value Date   VITAMINB12 279 09/27/2022   Lab Results  Component Value Date   TSH 2.087 06/04/2016    Lauraine Born, AGNP-C, DNP 10/06/2023, 8:37 AM [facility] [address] [place], [place] [address] [phone]

## 2023-10-06 NOTE — Patient Instructions (Signed)
 Continue the Plavix  for secondary stroke prevention  Strict management of vascular risk factors with a goal BP less than 130/90, A1c less than 7.0, LDL less than 70 for secondary stroke prevention Continue exercise, healthy eating  Continue Keppra  for seizure prevention  Follow up in 1 year

## 2023-10-10 ENCOUNTER — Other Ambulatory Visit (HOSPITAL_BASED_OUTPATIENT_CLINIC_OR_DEPARTMENT_OTHER)

## 2023-10-10 ENCOUNTER — Encounter (HOSPITAL_BASED_OUTPATIENT_CLINIC_OR_DEPARTMENT_OTHER): Payer: Self-pay | Admitting: Family

## 2023-10-10 ENCOUNTER — Ambulatory Visit (INDEPENDENT_AMBULATORY_CARE_PROVIDER_SITE_OTHER): Admitting: Family

## 2023-10-10 VITALS — BP 140/64 | HR 78 | Ht 71.0 in | Wt 176.0 lb

## 2023-10-10 DIAGNOSIS — I10 Essential (primary) hypertension: Secondary | ICD-10-CM | POA: Diagnosis not present

## 2023-10-10 DIAGNOSIS — E785 Hyperlipidemia, unspecified: Secondary | ICD-10-CM | POA: Diagnosis not present

## 2023-10-10 DIAGNOSIS — I25118 Atherosclerotic heart disease of native coronary artery with other forms of angina pectoris: Secondary | ICD-10-CM | POA: Diagnosis not present

## 2023-10-10 DIAGNOSIS — I77810 Thoracic aortic ectasia: Secondary | ICD-10-CM

## 2023-10-10 DIAGNOSIS — I7121 Aneurysm of the ascending aorta, without rupture: Secondary | ICD-10-CM

## 2023-10-10 DIAGNOSIS — Z8673 Personal history of transient ischemic attack (TIA), and cerebral infarction without residual deficits: Secondary | ICD-10-CM | POA: Diagnosis not present

## 2023-10-10 DIAGNOSIS — I493 Ventricular premature depolarization: Secondary | ICD-10-CM

## 2023-10-10 MED ORDER — METOPROLOL SUCCINATE ER 50 MG PO TB24: 50.0000 mg | ORAL_TABLET | Freq: Every day | ORAL | 1 refills | Status: DC

## 2023-10-10 NOTE — Progress Notes (Signed)
 Cardiology Office Note   Date:  10/10/2023  ID:  PEDROHENRIQUE Sutton, DOB Jul 01, 1945, MRN [id number] PCP: Regino Slater, MD  [facility] Providers Cardiologist:  Annabella Scarce, MD     History of Present Illness Justin Sutton is a 78 y.o. male with hx of CVA, HTN, HLD, aortic stenosis, mild aortic stenosis, ascending aortic aneurysm, coronary calcification, PAD, seizures, asbestos exposure, prior etoh abuse, carotid stenosis.    Initially seen 2016 for clearance for knee surgery with inadvertent finding of coronary calcification on non contrast CT. Echo 01/2015 mild aortic stenosis mean gradient and mild ascending aortic aneurysm. Repeat echo 06/2019 EF 55-60%, gr1DD, ascending aorta 4.5 cm and AV mean gradient 12 mmHg but dimensionless index 0.43. Echo 12/2020 mild aortic valve stenosis (mean gradient 10 mmHg), mild dilation ascending aorta 42mm. Echo 12/2021 normal LVEF, mild AS (mean gradient 9 mmHg), moderate dilation ascending aorta 46mm.   Admitted 6/23-6/25/24 after presently with progressive weakness, amnesia. UDS positive for THC. 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 Carotid duplex bilateral 1-39% stenosis. Echo LVEF 55-60%, mild MR, mild AS (mean gradient 12 mmHg), moderate ascending aorta dilation 44mm. Recommended for Aspirin  and Plavix  x 3 months then Plavix  monotherapy. Atorvastatin  was resumed as he had not been taking.   Most recent echo 09/2023 normal LVEF 60-65%, no RWM, RVSF mildly reduced, trivial MR, moderate AV calcification, trivial AI, moderate aortic stenosis (mean gradient 14 mmHg), aortic root 43mm, ascending aorta 46mm. Repeat echo in 1 year and recommend CT Aorta in 1 year recommended.    Discussed the use of AI scribe software for clinical note transcription with the patient, who gave verbal consent to  proceed.  History of Present Illness Justin Sutton is a 78 year old male with aortic valve stenosis who presents for cardiovascular follow-up.  He experiences premature ventricular contractions (PVCs) as indicated by EKG today, though he does not notice these early beats. He is reducing caffeine intake from eight to ten cups of coffee to one and a half cups per day. No palpitations or irregular heartbeats are present.  Aortic valve stenosis has progressed from mild to moderate, yet he remains asymptomatic with no chest pain, dyspnea, or syncope. He maintains an active lifestyle, walking approximately a mile three times a day without experiencing any chest pain, breathing difficulties, or dizziness.  He monitors his blood pressure at home, which typically reads around 120/78-85 mmHg, lower than office readings. He uses an arm cuff for these measurements.   His cholesterol was last checked in January, with an LDL of 76 mg/dL. He is on the maximum dose of atorvastatin . He is currently taking metoprolol  25 mg.   ROS: Please see the history of present illness.    All other systems reviewed and are negative.   Studies Reviewed EKG Interpretation Date/Time:  Monday October 10 2023 09:13:31 EDT Ventricular Rate:  78 PR Interval:  242 QRS Duration:  82 QT Interval:  378 QTC Calculation: 430 R Axis:   67  Text Interpretation: Sinus rhythm with 1st degree A-V block with frequent Premature ventricular complexes in a pattern of bigeminy Confirmed by Vannie Mora ([id number]) on 10/10/2023 10:15:23 AM    Cardiac Studies & Procedures   ______________________________________________________________________________________________   STRESS TESTS  MYOCARDIAL PERFUSION IMAGING 06/02/2018  Narrative  Nuclear stress EF: 50%. Visually, the EF appears to be 55-60%.  This is  a low risk study. There is no ischemia . no evidence of previous infarction  The study is normal.    ECHOCARDIOGRAM  ECHOCARDIOGRAM COMPLETE 09/05/2023  Narrative ECHOCARDIOGRAM REPORT    Patient Name:   Justin Sutton Date of Exam: 09/05/2023 Medical Rec #:  [id number]        Height:       71.0 in Accession #:    [id number]       Weight:       185.4 lb Date of Birth:  April 26, 1945        BSA:          2.042 m Patient Age:    78 years         BP:           160/78 mmHg Patient Gender: M                HR:           75 bpm. Exam Location:  Outpatient  Procedure: 2D Echo, 3D Echo, Color Doppler, Cardiac Doppler and Strain Analysis (Both Spectral and Color Flow Doppler were utilized during procedure).  Indications:    Aortic Root dilation  History:        Patient has prior history of Echocardiogram examinations, most recent 01/03/2023. Risk Factors:Former Smoker, Hypertension and Dyslipidemia. Thoracic aortic aneurysm.  Sonographer:    Orvil Holmes RDCS Referring Phys: [id number] Ceceilia Cephus S Elenna Spratling  IMPRESSIONS   1. Left ventricular ejection fraction, by estimation, is 60 to 65%. The left ventricle has normal function. The left ventricle has no regional wall motion abnormalities. Left ventricular diastolic parameters were normal. The average left ventricular global longitudinal strain is -20.5 %. The global longitudinal strain is normal. 2. Right ventricular systolic function is mildly reduced. The right ventricular size is normal. There is normal pulmonary artery systolic pressure. The estimated right ventricular systolic pressure is 15.7 mmHg. 3. The mitral valve is degenerative. Trivial mitral valve regurgitation. No evidence of mitral stenosis. 4. The aortic valve is calcified. There is moderate calcification of the aortic valve. There is moderate thickening of the aortic valve. Aortic valve regurgitation is trivial. Moderate aortic valve stenosis. Aortic valve area, by VTI measures 0.96 cm. Aortic valve mean gradient measures 14.0 mmHg. Aortic valve Vmax measures 2.57 m/s. Although  the mean AVG and Vmax are c/w mild AS, the DVI is low at 0.34 and SVI low at 24. All measurements together are most consistent with moderate low flow low gradient aortic stenosis. 5. Aortic dilatation noted. Aneurysm of the ascending aorta, measuring 46 mm. There is mild dilatation of the aortic root, measuring 43 mm. 6. The inferior vena cava is normal in size with greater than 50% respiratory variability, suggesting right atrial pressure of 3 mmHg. 7. Compared to study dated 09/28/22, the mean AVG has not changed much but the DVI has decreased from 0.49 to 0.34 and SVI has decreased from 41 to 24 consistent with progression of aortic stenosis.  FINDINGS Left Ventricle: Left ventricular ejection fraction, by estimation, is 60 to 65%. The left ventricle has normal function. The left ventricle has no regional wall motion abnormalities. The average left ventricular global longitudinal strain is -20.5 %. Strain was performed and the global longitudinal strain is normal. The left ventricular internal cavity size was normal in size. There is no left ventricular hypertrophy. Left ventricular diastolic parameters were normal. Normal left ventricular filling pressure.  Right Ventricle: The right ventricular size is normal. No increase  in right ventricular wall thickness. Right ventricular systolic function is mildly reduced. There is normal pulmonary artery systolic pressure. The tricuspid regurgitant velocity is 1.78 m/s, and with an assumed right atrial pressure of 3 mmHg, the estimated right ventricular systolic pressure is 15.7 mmHg.  Left Atrium: Left atrial size was normal in size.  Right Atrium: Right atrial size was normal in size.  Pericardium: There is no evidence of pericardial effusion.  Mitral Valve: The mitral valve is degenerative in appearance. There is mild calcification of the mitral valve leaflet(s). Mild mitral annular calcification. Trivial mitral valve regurgitation. No evidence of  mitral valve stenosis.  Tricuspid Valve: The tricuspid valve is normal in structure. Tricuspid valve regurgitation is trivial. No evidence of tricuspid stenosis.  Aortic Valve: The aortic valve is calcified. There is moderate calcification of the aortic valve. There is moderate thickening of the aortic valve. Aortic valve regurgitation is trivial. Moderate aortic stenosis is present. Aortic valve mean gradient measures 14.0 mmHg. Aortic valve peak gradient measures 26.4 mmHg. Aortic valve area, by VTI measures 0.96 cm.  Pulmonic Valve: The pulmonic valve was normal in structure. Pulmonic valve regurgitation is not visualized. No evidence of pulmonic stenosis.  Aorta: Aortic dilatation noted. There is mild dilatation of the aortic root, measuring 43 mm. There is an aneurysm involving the ascending aorta measuring 46 mm.  Venous: The inferior vena cava is normal in size with greater than 50% respiratory variability, suggesting right atrial pressure of 3 mmHg.  IAS/Shunts: No atrial level shunt detected by color flow Doppler.   LEFT VENTRICLE PLAX 2D LVIDd:         4.94 cm   Diastology LVIDs:         2.84 cm   LV e' medial:    8.27 cm/s LV PW:         1.28 cm   LV E/e' medial:  9.0 LV IVS:        0.90 cm   LV e' lateral:   10.00 cm/s LVOT diam:     1.90 cm   LV E/e' lateral: 7.4 LV SV:         50 LV SV Index:   24        2D Longitudinal Strain LVOT Area:     2.84 cm  2D Strain GLS (A4C):   -21.5 % 2D Strain GLS (A3C):   -20.1 % 2D Strain GLS (A2C):   -19.9 % 2D Strain GLS Avg:     -20.5 %  3D Volume EF: 3D EF:        58 % LV EDV:       175 ml LV ESV:       73 ml LV SV:        102 ml  RIGHT VENTRICLE RV Basal diam:  3.25 cm RV Mid diam:    2.66 cm RV S prime:     10.20 cm/s TAPSE (M-mode): 1.3 cm  LEFT ATRIUM             Index        RIGHT ATRIUM           Index LA diam:        4.60 cm 2.25 cm/m   RA Area:     20.30 cm LA Vol (A2C):   51.4 ml 25.17 ml/m  RA Volume:    62.80 ml  30.76 ml/m LA Vol (A4C):   40.4 ml 19.79 ml/m LA Biplane Vol: 46.4 ml 22.73  ml/m AORTIC VALVE AV Area (Vmax):    0.92 cm AV Area (Vmean):   0.87 cm AV Area (VTI):     0.96 cm AV Vmax:           257.00 cm/s AV Vmean:          175.000 cm/s AV VTI:            0.521 m AV Peak Grad:      26.4 mmHg AV Mean Grad:      14.0 mmHg LVOT Vmax:         83.30 cm/s LVOT Vmean:        54.000 cm/s LVOT VTI:          0.176 m LVOT/AV VTI ratio: 0.34  AORTA Ao Root diam: 4.30 cm Ao Asc diam:  4.60 cm  MITRAL VALVE               TRICUSPID VALVE MV Area (PHT): 3.37 cm    TR Peak grad:   12.7 mmHg MV Decel Time: 225 msec    TR Vmax:        178.00 cm/s MV E velocity: 74.40 cm/s MV A velocity: 86.50 cm/s  SHUNTS MV E/A ratio:  0.86        Systemic VTI:  0.18 m Systemic Diam: 1.90 cm  Wilbert Bihari MD Electronically signed by Wilbert Bihari MD Signature Date/Time: 09/05/2023/10:21:24 AM    Final          ______________________________________________________________________________________________      Risk Assessment/Calculations   HYPERTENSION CONTROL Vitals:   10/10/23 0908 10/10/23 0945  BP: (!) 144/62 (!) 140/64    The patient's blood pressure is elevated above target today.  In order to address the patient's elevated BP: Blood pressure will be monitored at home to determine if medication changes need to be made.; The blood pressure is usually elevated in clinic.  Blood pressures monitored at home have been optimal.          Physical Exam VS:  BP (!) 140/64   Pulse 78   Ht 5' 11 (1.803 m)   Wt 176 lb (79.8 kg)   SpO2 99%   BMI 24.55 kg/m        Wt Readings from Last 3 Encounters:  10/10/23 176 lb (79.8 kg)  10/06/23 179 lb 8 oz (81.4 kg)  01/10/23 185 lb 6.4 oz (84.1 kg)    GEN: Well nourished, well developed in no acute distress NECK: No JVD; No carotid bruits CARDIAC: RRR, no  rubs, gallops. Gr 3/6 systolic murmur best auscultated at  RUSB RESPIRATORY:  Clear to auscultation without rales, wheezing or rhonchi  ABDOMEN: Soft, non-tender, non-distended EXTREMITIES:  No edema; No deformity   ASSESSMENT AND PLAN Assessment & Plan Premature ventricular contractions PVCs identified on EKG today and pattern of bigeminy. Asymptomatic. Reducing caffeine intake. Metoprolol  may be increased to reduce PVC frequency. - Increase metoprolol  to 50 mg daily to reduce frequency of PVCs. - Order a ZIO monitor for three days to assess PVC frequency. - Instruct to report symptoms such as chest pain, dyspnea, or syncope if he occurs.  Moderate aortic valve stenosis Moderate stenosis on echocardiogram with preserved cardiac function. Asymptomatic. Management includes lifestyle modifications to prevent progression and BP control.  - Continue current exercise regimen and heart-healthy diet. - Monitor for symptoms indicating worsening stenosis, such as chest pain, dyspnea, or syncope, and report if he occurs. -Repeat echo in 1 year already ordered  Coronary calcification on  CT / Hx of CVA / Hyperlipidemia, LDL goal <70 Stable with no anginal symptoms. No indication for ischemic evaluation.  LDL cholesterol slightly above target with labs 04/2023 LDL 76. On maximum atorvastatin  80mg  daily. Discussed options including adding Zetia or rechecking levels. - Order blood work to recheck cholesterol levels (CMET, lipid panel) - Consider adding Zetia if LDL remains above target after re-evaluation.  HTN Well controlled by home readings. Cotninue amlodipine  5mg  daily. Increase Toprol  dose, as above. Discussed to monitor BP at home at least 2 hours after medications and sitting for 5-10 minutes.   Aortic root dilation / Ascending aortic aneurysm -09/2022 aortic root 38mm and ascending aorta 44mm ? 09/2023 aortic root nd ascneding aorat 46 mm. CT Aorta in 1 year already ordered. Increase toprol  to 50mg  daily, as above. BP control, as above.            Dispo: follow up in 6 mos  Signed, Reche GORMAN Finder, NP

## 2023-10-10 NOTE — Patient Instructions (Addendum)
 Medication Instructions:   CHANGE Metoprolol  Succinate to 50mg  daily You may use up your 25mg  tablets by taking two together  *If you need a refill on your cardiac medications before your next appointment, please call your pharmacy*  Lab Work: Your physician recommends that you return for lab work today: CMET, lipid panel, TSH, magnesium   This is to recheck your cholesterol to make sure your LDL (bad cholesterol) is at goal of less than 70.  We are also making sure your thyroid  and electrolytes are normal and not causing those early beats called PVC's we saw on the EKG today.   Testing/Procedures Your physician has recommended that you wear a Zio monitor.   This monitor is a medical device that records the heart's electrical activity. Doctors most often use these monitors to diagnose arrhythmias. Arrhythmias are problems with the speed or rhythm of the heartbeat. The monitor is a small device applied to your chest. You can wear one while you do your normal daily activities. While wearing this monitor if you have any symptoms to push the button and record what you felt. Once you have worn this monitor for the period of time provider prescribed (Usually 14 days), you will return the monitor device in the postage paid box. Once it is returned they will download the data collected and provide us  with a report which the provider will then review and we will call you with those results. Important tips:  Avoid showering during the first 24 hours of wearing the monitor. Avoid excessive sweating to help maximize wear time. Do not submerge the device, no hot tubs, and no swimming pools. Keep any lotions or oils away from the patch. After 24 hours you may shower with the patch on. Take brief showers with your back facing the shower head.  Do not remove patch once it has been placed because that will interrupt data and decrease adhesive wear time. Push the button when you have any symptoms and write  down what you were feeling. Once you have completed wearing your monitor, remove and place into box which has postage paid and place in your outgoing mailbox.  If for some reason you have misplaced your box then call our office and we can provide another box and/or mail it off for you.  Follow-Up: Your next appointment:   6 month(s)  Provider:   Annabella Scarce, MD, Rosaline Bane, NP, or Reche Finder, NP    Other Instructions  Your EKG today showed PVC's (early beats in the bottom of the heart). You are not noticing these but we have placed a 3 day ZIO monitor today to make sure they are not happening excessively. We changed your Metoprolol  today to help prevent them from happening as often.

## 2023-10-11 ENCOUNTER — Ambulatory Visit (HOSPITAL_BASED_OUTPATIENT_CLINIC_OR_DEPARTMENT_OTHER): Payer: Self-pay | Admitting: Family

## 2023-10-11 LAB — COMPREHENSIVE METABOLIC PANEL WITH GFR
ALT: 25 IU/L (ref 0–44)
AST: 26 IU/L (ref 0–40)
Albumin: 4.8 g/dL (ref 3.8–4.8)
Alkaline Phosphatase: 75 IU/L (ref 44–121)
BUN/Creatinine Ratio: 8 — ABNORMAL LOW (ref 10–24)
BUN: 7 mg/dL — ABNORMAL LOW (ref 8–27)
Bilirubin Total: 0.8 mg/dL (ref 0.0–1.2)
CO2: 19 mmol/L — ABNORMAL LOW (ref 20–29)
Calcium: 9.5 mg/dL (ref 8.6–10.2)
Chloride: 94 mmol/L — ABNORMAL LOW (ref 96–106)
Creatinine, Ser: 0.86 mg/dL (ref 0.76–1.27)
Globulin, Total: 2.2 g/dL (ref 1.5–4.5)
Glucose: 81 mg/dL (ref 70–99)
Potassium: 4.5 mmol/L (ref 3.5–5.2)
Sodium: 131 mmol/L — ABNORMAL LOW (ref 134–144)
Total Protein: 7 g/dL (ref 6.0–8.5)
eGFR: 89 mL/min/1.73 (ref >59)

## 2023-10-11 LAB — LIPID PANEL
Chol/HDL Ratio: 2.3 ratio (ref 0.0–5.0)
Cholesterol, Total: 157 mg/dL (ref 100–199)
HDL: 67 mg/dL (ref >39)
LDL Chol Calc (NIH): 70 mg/dL (ref 0–99)
Triglycerides: 112 mg/dL (ref 0–149)
VLDL Cholesterol Cal: 20 mg/dL (ref 5–40)

## 2023-10-11 LAB — TSH: TSH: 1.79 u[IU]/mL (ref 0.450–4.500)

## 2023-10-11 LAB — MAGNESIUM: Magnesium: 2.1 mg/dL (ref 1.6–2.3)

## 2023-10-12 DIAGNOSIS — H34832 Tributary (branch) retinal vein occlusion, left eye, with macular edema: Secondary | ICD-10-CM | POA: Diagnosis not present

## 2023-10-14 DIAGNOSIS — Z8673 Personal history of transient ischemic attack (TIA), and cerebral infarction without residual deficits: Secondary | ICD-10-CM | POA: Diagnosis not present

## 2023-10-14 DIAGNOSIS — Z79899 Other long term (current) drug therapy: Secondary | ICD-10-CM | POA: Diagnosis not present

## 2023-10-14 DIAGNOSIS — I77819 Aortic ectasia, unspecified site: Secondary | ICD-10-CM | POA: Diagnosis not present

## 2023-10-14 DIAGNOSIS — R7301 Impaired fasting glucose: Secondary | ICD-10-CM | POA: Diagnosis not present

## 2023-10-14 DIAGNOSIS — M1990 Unspecified osteoarthritis, unspecified site: Secondary | ICD-10-CM | POA: Diagnosis not present

## 2023-10-14 DIAGNOSIS — E7800 Pure hypercholesterolemia, unspecified: Secondary | ICD-10-CM | POA: Diagnosis not present

## 2023-10-24 DIAGNOSIS — I493 Ventricular premature depolarization: Secondary | ICD-10-CM | POA: Diagnosis not present

## 2023-10-26 DIAGNOSIS — I493 Ventricular premature depolarization: Secondary | ICD-10-CM

## 2023-10-28 ENCOUNTER — Other Ambulatory Visit (HOSPITAL_BASED_OUTPATIENT_CLINIC_OR_DEPARTMENT_OTHER): Payer: Self-pay | Admitting: *Deleted

## 2023-10-28 DIAGNOSIS — I493 Ventricular premature depolarization: Secondary | ICD-10-CM

## 2023-10-28 NOTE — Telephone Encounter (Signed)
 Wife Wyman) returned NP's call.

## 2023-11-21 NOTE — Progress Notes (Unsigned)
 Electrophysiology Office Note:    Date:  11/22/2023   ID:  Justin Sutton, DOB 1945-11-20, MRN [id number]  PCP:  Regino Slater, MD   [facility] Providers Cardiologist:  Annabella Scarce, MD     Referring MD: Vannie Reche GORMAN, NP   History of Present Illness:    Justin Sutton is a 78 y.o. male with a medical history significant for frequent PVCs, mild aortic stenosis, history of stroke, hypertension, hyperlipidemia, referred for evaluation management of PVCs.     Discussed the use of AI scribe software for clinical note transcription with the patient, who gave verbal consent to proceed.  History of Present Illness Justin Sutton is a 78 year old male with frequent PVCs who presents for a consult. He was referred by [name] for evaluation of frequent PVCs.  He has frequent premature ventricular contractions with a PVC burden of 22.7%. He does not experience irregular heartbeats or palpitations but does have leg swelling.  In 2016, coronary calcification and mild dilation of the ascending aorta were noted during a cardiology evaluation for surgical clearance.  He is on metoprolol , increased from 25 mg to 50 mg in July, and exercises regularly, walking three-quarters to a mile three or four times a day.          Today, he reports that he is doing well and has no complaints.  EKGs/Labs/Other Studies Reviewed Today:     Echocardiogram:  TTE September 05, 2023 LVEF 60 to 65%.  Aortic valve was somewhat thickened and calcified with trivial regurgitation and moderate stenosis.  The aortic aneurysm was mildly aneurysmal measuring 46 mm.   Monitors:  3 day monitor July 2025-- my interpretation Sinus rhythm heart rate 48 to 160 bpm, average 69 bpm PVC burden 22.7% No symptom episodes reported    Stress testing:  Nuclear stress test, pharmacological February 2020 Low risk study    EKG:   EKG Interpretation Date/Time:  Tuesday November 22 2023  15:03:11 EDT Ventricular Rate:  57 PR Interval:  266 QRS Duration:  86 QT Interval:  436 QTC Calculation: 424 R Axis:   32  Text Interpretation: Sinus bradycardia with 1st degree A-V block Anterior infarct (cited on or before 22-Nov-2023) When compared with ECG of 10-Oct-2023 09:13, Premature ventricular complexes are no longer Present Confirmed by Nancey Scotts [id number]) on 11/22/2023 3:15:29 PM     Physical Exam:    VS:  BP 108/60   Pulse (!) 57   Ht 5' 11 (1.803 m)   Wt 182 lb 12.8 oz (82.9 kg)   SpO2 94%   BMI 25.50 kg/m     Wt Readings from Last 3 Encounters:  11/22/23 182 lb 12.8 oz (82.9 kg)  10/10/23 176 lb (79.8 kg)  10/06/23 179 lb 8 oz (81.4 kg)     GEN: Well nourished, well developed in no acute distress CARDIAC: RRR, no murmurs, rubs, gallops RESPIRATORY:  Normal work of breathing MUSCULOSKELETAL: no edema    ASSESSMENT & PLAN:     Frequent PVCs Burden 22.7% --essentially monomorphic LV origin --negative in inferior leads, markedly positive V1, negative V6 negative lead I He is asymptomatic, and LVEF is normal With normal EF and no symptoms, I do not see strong indication for antiarrhythmic drug or ablation.   No PVCs on EKG today Continue metoprolol  XL 50 Will start magnesium  taurate  Moderate aortic valve stenosis   Coronary calcification on CT No anginal symptoms  Aortic root dilation, ascending aortic aneurysm  Signed, Eulas FORBES Furbish, MD  11/22/2023 3:20 PM    [facility]

## 2023-11-22 ENCOUNTER — Encounter: Payer: Self-pay | Admitting: Cardiovascular Disease

## 2023-11-22 ENCOUNTER — Ambulatory Visit: Attending: Cardiovascular Disease | Admitting: Cardiovascular Disease

## 2023-11-22 VITALS — BP 108/60 | HR 57 | Ht 71.0 in | Wt 182.8 lb

## 2023-11-22 DIAGNOSIS — I493 Ventricular premature depolarization: Secondary | ICD-10-CM | POA: Insufficient documentation

## 2023-11-22 NOTE — Patient Instructions (Signed)
 Medication Instructions:  Your physician has recommended you make the following change in your medication:   ** Begin Magnesium  Taurate 400mg  daily   *If you need a refill on your cardiac medications before your next appointment, please call your pharmacy*  Lab Work: None ordered.  If you have labs (blood work) drawn today and your tests are completely normal, you will receive your results only by: MyChart Message (if you have MyChart) OR A paper copy in the mail If you have any lab test that is abnormal or we need to change your treatment, we will call you to review the results.  Testing/Procedures: None ordered.   Follow-Up: At [facility], you and your health needs are our priority.  As part of our continuing mission to provide you with exceptional heart care, our providers are all part of one team.  This team includes your primary Cardiologist (physician) and Advanced Practice Providers or APPs (Physician Assistants and Nurse Practitioners) who all work together to provide you with the care you need, when you need it.  Your next appointment:   6 months with Dr Mealor

## 2023-12-07 DIAGNOSIS — H34832 Tributary (branch) retinal vein occlusion, left eye, with macular edema: Secondary | ICD-10-CM | POA: Diagnosis not present

## 2023-12-22 ENCOUNTER — Other Ambulatory Visit: Payer: Self-pay | Admitting: Neurology

## 2024-01-25 DIAGNOSIS — H34832 Tributary (branch) retinal vein occlusion, left eye, with macular edema: Secondary | ICD-10-CM | POA: Diagnosis not present

## 2024-02-03 DIAGNOSIS — Z23 Encounter for immunization: Secondary | ICD-10-CM | POA: Diagnosis not present

## 2024-02-15 ENCOUNTER — Other Ambulatory Visit: Payer: Self-pay | Admitting: Family

## 2024-02-15 DIAGNOSIS — I25118 Atherosclerotic heart disease of native coronary artery with other forms of angina pectoris: Secondary | ICD-10-CM

## 2024-02-15 DIAGNOSIS — E785 Hyperlipidemia, unspecified: Secondary | ICD-10-CM

## 2024-03-07 DIAGNOSIS — H34832 Tributary (branch) retinal vein occlusion, left eye, with macular edema: Secondary | ICD-10-CM | POA: Diagnosis not present

## 2024-04-02 ENCOUNTER — Ambulatory Visit (INDEPENDENT_AMBULATORY_CARE_PROVIDER_SITE_OTHER)

## 2024-04-02 ENCOUNTER — Encounter (HOSPITAL_BASED_OUTPATIENT_CLINIC_OR_DEPARTMENT_OTHER): Payer: Self-pay

## 2024-04-02 VITALS — BP 132/84 | HR 58 | Ht 71.0 in | Wt 179.6 lb

## 2024-04-02 DIAGNOSIS — Z87891 Personal history of nicotine dependence: Secondary | ICD-10-CM

## 2024-04-02 DIAGNOSIS — I77810 Thoracic aortic ectasia: Secondary | ICD-10-CM

## 2024-04-02 DIAGNOSIS — I7121 Aneurysm of the ascending aorta, without rupture: Secondary | ICD-10-CM | POA: Diagnosis not present

## 2024-04-02 DIAGNOSIS — I493 Ventricular premature depolarization: Secondary | ICD-10-CM

## 2024-04-02 DIAGNOSIS — I350 Nonrheumatic aortic (valve) stenosis: Secondary | ICD-10-CM

## 2024-04-02 DIAGNOSIS — I25118 Atherosclerotic heart disease of native coronary artery with other forms of angina pectoris: Secondary | ICD-10-CM | POA: Diagnosis not present

## 2024-04-02 DIAGNOSIS — E785 Hyperlipidemia, unspecified: Secondary | ICD-10-CM

## 2024-04-02 NOTE — Patient Instructions (Signed)
 Medication Instructions:  Continue your current medications.   *If you need a refill on your cardiac medications before your next appointment, please call your pharmacy*  Lab Work: Recommend having cholesterol checked with primary care in January. Our goal is for your LDL (bad cholesterol) to be less than 55. If your LDL (bad cholesterol)  is not at goal we would consider adding a medication called Ezetimibe (Zetia).   Follow-Up: At [facility], you and your health needs are our priority.  As part of our continuing mission to provide you with exceptional heart care, our providers are all part of one team.  This team includes your primary Cardiologist (physician) and Advanced Practice Providers or APPs (Physician Assistants and Nurse Practitioners) who all work together to provide you with the care you need, when you need it.  Your next appointment:   6 month(s) [After your echocardiogram in June]  Provider:   Annabella Scarce, MD, Rosaline Bane, NP, or Reche Finder, NP    We recommend signing up for the patient portal called MyChart.  Sign up information is provided on this After Visit Summary.  MyChart is used to connect with patients for Virtual Visits (Telemedicine).  Patients are able to view lab/test results, encounter notes, upcoming appointments, etc.  Non-urgent messages can be sent to your provider as well.   To learn more about what you can do with MyChart, go to [email or web address].

## 2024-04-02 NOTE — Progress Notes (Signed)
 " Cardiology Office Note   Date:  04/02/2024  ID:  Justin Sutton, DOB [birth year], MRN [id number] PCP: Regino Slater, MD  [facility] Providers Cardiologist:  Annabella Scarce, MD     History of Present Illness Justin Sutton is a 78 y.o. male with history of mild aortic stenosis, ascending aortic aneurysm, CVA, hypertension, hyperlipidemia, coronary calcification, PAD, seizures, prior EtOH abuse, and carotid stenosis.    He was initially seen in 2016 for knee surgery cardiac clearance and incidentally found coronary calcification on noncontrast CT.  Initial echo 01/2015 showed mild aortic stenosis, mean gradient 12 mmHg and mild ascending aortic aneurysm.  Nuclear stress test 05/2018 low risk study.  09/2022 admitted for progressive weakness and amnesia.  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  Carotid duplex 09/2022 bilateral 1-39% stenosis.  He was started on aspirin  and Plavix  for 3 months, then Plavix  monotherapy.  Most recent echo 09/2023 LVEF 60 to 65%, no RWMA, mildly reduced RV systolic function, trivial mitral valve regurgitation, moderate calcification of aortic valve, mild dilation of aortic root at 43 mm, and 46 mm ascending aortic aneurysm. ZIO monitor placed for PVCs and bigeminy on EKG 10/2023.  Results showed predominant NSR, average heart rate 69 bpm, 22.7% PVC burden, and 11 episodes of NSVT lasting up to 8 beats.  Metoprolol  was increased and patient referred to EP.  He was seen by Dr. Nancey 11/2023.  Antiarrhythmic drug and ablation was ruled out as patient is asymptomatic and LVEF normal.  He was started on magnesium  tautrate.     He presents today with his wife for 63-month follow-up.  He continues to walk several times daily, totaling 2 to 3 miles daily.  He does have to split up his walking D/T arthritic knee pain. He also  experiences weakness in his knees after sitting for a prolonged period of time. His wife does note that he snores at night but does not recall gasping for air or pauses in breathing. He has been given a medication for COPD in the past, but is unsure is he was diagnosed. He denies changes in breathing ans is afebrile today. He denies CP, SOB, lower extremity edema, fatigue, palpitations, melena, hematuria, hemoptysis, diaphoresis, weakness, presyncope, syncope, orthopnea, and PND.  ROS: All systems negative unless otherwise indicated HPI.  Studies Reviewed      Cardiac Studies & Procedures   ______________________________________________________________________________________________   STRESS TESTS  MYOCARDIAL PERFUSION IMAGING 06/02/2018  Interpretation Summary  Nuclear stress EF: 50%. Visually, the EF appears to be 55-60%.  This is a low risk study. There is no ischemia . no evidence of previous infarction  The study is normal.   ECHOCARDIOGRAM  ECHOCARDIOGRAM COMPLETE 09/05/2023  Narrative ECHOCARDIOGRAM REPORT    Patient Name:   Justin Sutton Date of Exam: 09/05/2023 Medical Rec #:  [id number]        Height:       71.0 in Accession #:    [id number]       Weight:       185.4 lb Date of Birth:  05/23/1945        BSA:          2.042 m Patient Age:    78 years         BP:           160/78 mmHg Patient Gender: M  HR:           75 bpm. Exam Location:  Outpatient  Procedure: 2D Echo, 3D Echo, Color Doppler, Cardiac Doppler and Strain Analysis (Both Spectral and Color Flow Doppler were utilized during procedure).  Indications:    Aortic Root dilation  History:        Patient has prior history of Echocardiogram examinations, most recent 01/03/2023. Risk Factors:Former Smoker, Hypertension and Dyslipidemia. Thoracic aortic aneurysm.  Sonographer:    Orvil Holmes RDCS Referring Phys: [id number] CAITLIN S WALKER  IMPRESSIONS   1. Left ventricular ejection  fraction, by estimation, is 60 to 65%. The left ventricle has normal function. The left ventricle has no regional wall motion abnormalities. Left ventricular diastolic parameters were normal. The average left ventricular global longitudinal strain is -20.5 %. The global longitudinal strain is normal. 2. Right ventricular systolic function is mildly reduced. The right ventricular size is normal. There is normal pulmonary artery systolic pressure. The estimated right ventricular systolic pressure is 15.7 mmHg. 3. The mitral valve is degenerative. Trivial mitral valve regurgitation. No evidence of mitral stenosis. 4. The aortic valve is calcified. There is moderate calcification of the aortic valve. There is moderate thickening of the aortic valve. Aortic valve regurgitation is trivial. Moderate aortic valve stenosis. Aortic valve area, by VTI measures 0.96 cm. Aortic valve mean gradient measures 14.0 mmHg. Aortic valve Vmax measures 2.57 m/s. Although the mean AVG and Vmax are c/w mild AS, the DVI is low at 0.34 and SVI low at 24. All measurements together are most consistent with moderate low flow low gradient aortic stenosis. 5. Aortic dilatation noted. Aneurysm of the ascending aorta, measuring 46 mm. There is mild dilatation of the aortic root, measuring 43 mm. 6. The inferior vena cava is normal in size with greater than 50% respiratory variability, suggesting right atrial pressure of 3 mmHg. 7. Compared to study dated 09/28/22, the mean AVG has not changed much but the DVI has decreased from 0.49 to 0.34 and SVI has decreased from 41 to 24 consistent with progression of aortic stenosis.  FINDINGS Left Ventricle: Left ventricular ejection fraction, by estimation, is 60 to 65%. The left ventricle has normal function. The left ventricle has no regional wall motion abnormalities. The average left ventricular global longitudinal strain is -20.5 %. Strain was performed and the global longitudinal strain  is normal. The left ventricular internal cavity size was normal in size. There is no left ventricular hypertrophy. Left ventricular diastolic parameters were normal. Normal left ventricular filling pressure.  Right Ventricle: The right ventricular size is normal. No increase in right ventricular wall thickness. Right ventricular systolic function is mildly reduced. There is normal pulmonary artery systolic pressure. The tricuspid regurgitant velocity is 1.78 m/s, and with an assumed right atrial pressure of 3 mmHg, the estimated right ventricular systolic pressure is 15.7 mmHg.  Left Atrium: Left atrial size was normal in size.  Right Atrium: Right atrial size was normal in size.  Pericardium: There is no evidence of pericardial effusion.  Mitral Valve: The mitral valve is degenerative in appearance. There is mild calcification of the mitral valve leaflet(s). Mild mitral annular calcification. Trivial mitral valve regurgitation. No evidence of mitral valve stenosis.  Tricuspid Valve: The tricuspid valve is normal in structure. Tricuspid valve regurgitation is trivial. No evidence of tricuspid stenosis.  Aortic Valve: The aortic valve is calcified. There is moderate calcification of the aortic valve. There is moderate thickening of the aortic valve. Aortic valve regurgitation is trivial.  Moderate aortic stenosis is present. Aortic valve mean gradient measures 14.0 mmHg. Aortic valve peak gradient measures 26.4 mmHg. Aortic valve area, by VTI measures 0.96 cm.  Pulmonic Valve: The pulmonic valve was normal in structure. Pulmonic valve regurgitation is not visualized. No evidence of pulmonic stenosis.  Aorta: Aortic dilatation noted. There is mild dilatation of the aortic root, measuring 43 mm. There is an aneurysm involving the ascending aorta measuring 46 mm.  Venous: The inferior vena cava is normal in size with greater than 50% respiratory variability, suggesting right atrial pressure of 3  mmHg.  IAS/Shunts: No atrial level shunt detected by color flow Doppler.   LEFT VENTRICLE PLAX 2D LVIDd:         4.94 cm   Diastology LVIDs:         2.84 cm   LV e' medial:    8.27 cm/s LV PW:         1.28 cm   LV E/e' medial:  9.0 LV IVS:        0.90 cm   LV e' lateral:   10.00 cm/s LVOT diam:     1.90 cm   LV E/e' lateral: 7.4 LV SV:         50 LV SV Index:   24        2D Longitudinal Strain LVOT Area:     2.84 cm  2D Strain GLS (A4C):   -21.5 % 2D Strain GLS (A3C):   -20.1 % 2D Strain GLS (A2C):   -19.9 % 2D Strain GLS Avg:     -20.5 %  3D Volume EF: 3D EF:        58 % LV EDV:       175 ml LV ESV:       73 ml LV SV:        102 ml  RIGHT VENTRICLE RV Basal diam:  3.25 cm RV Mid diam:    2.66 cm RV S prime:     10.20 cm/s TAPSE (M-mode): 1.3 cm  LEFT ATRIUM             Index        RIGHT ATRIUM           Index LA diam:        4.60 cm 2.25 cm/m   RA Area:     20.30 cm LA Vol (A2C):   51.4 ml 25.17 ml/m  RA Volume:   62.80 ml  30.76 ml/m LA Vol (A4C):   40.4 ml 19.79 ml/m LA Biplane Vol: 46.4 ml 22.73 ml/m AORTIC VALVE AV Area (Vmax):    0.92 cm AV Area (Vmean):   0.87 cm AV Area (VTI):     0.96 cm AV Vmax:           257.00 cm/s AV Vmean:          175.000 cm/s AV VTI:            0.521 m AV Peak Grad:      26.4 mmHg AV Mean Grad:      14.0 mmHg LVOT Vmax:         83.30 cm/s LVOT Vmean:        54.000 cm/s LVOT VTI:          0.176 m LVOT/AV VTI ratio: 0.34  AORTA Ao Root diam: 4.30 cm Ao Asc diam:  4.60 cm  MITRAL VALVE  TRICUSPID VALVE MV Area (PHT): 3.37 cm    TR Peak grad:   12.7 mmHg MV Decel Time: 225 msec    TR Vmax:        178.00 cm/s MV E velocity: 74.40 cm/s MV A velocity: 86.50 cm/s  SHUNTS MV E/A ratio:  0.86        Systemic VTI:  0.18 m Systemic Diam: 1.90 cm  Wilbert Bihari MD Electronically signed by Wilbert Bihari MD Signature Date/Time: 09/05/2023/10:21:24 AM    Final    MONITORS  LONG TERM MONITOR (3-14 DAYS)  10/24/2023  Narrative 3 Day Zio Monitor  Quality: Fair.  Baseline artifact. Predominant rhythm: sinus rhythm Average heart rate: 69 bpm Max heart rate: 160 bpm Min heart rate: 48 bpm Pauses >2.5 seconds: none  Rare PACs (<1%) Frequent PVCs (22.7%) 11 episodes of NSVT lasitng up to 8 beats 5 episodes of SVT lasting up to 7 beats.   Tiffany C. Raford, MD, [facility] 10/26/2023 6:40 PM       ______________________________________________________________________________________________      Risk Assessment/Calculations           Physical Exam VS:  BP 132/84   Pulse (!) 58   Ht 5' 11 (1.803 m)   Wt 179 lb 9.6 oz (81.5 kg)   SpO2 94%   BMI 25.05 kg/m        Wt Readings from Last 3 Encounters:  04/02/24 179 lb 9.6 oz (81.5 kg)  11/22/23 182 lb 12.8 oz (82.9 kg)  10/10/23 176 lb (79.8 kg)    GEN: Well nourished, well developed in no acute distress NECK: No JVD; No carotid bruits CARDIAC: RRR, no murmurs, rubs, gallops RESPIRATORY:  Wheezing noted in bilateral upper lobes.  Bilateral lower lobed clear to auscultation. No rales or rhonchi. ABDOMEN: Soft, non-tender, non-distended EXTREMITIES:  No edema; No deformity   ASSESSMENT AND PLAN  Frequent PVCs - Seen by EP 11/2023.  Patient was asymptomatic and normal EF.  Ablation and antiarrhythmic drug not recommended at this time. HE has not been taking his BP at home. Today he denies palpitations, lightheadedness, dizziness, and syncope.  Continue metoprolol  succinate 50 mg daily and magnesium  tautrate.  Moderate aortic valve stenosis - Moderate stenosis on echo with preserved cardiac function.  Patient is asymptomatic.  Will continue BP control and lifestyle modifications.  Repeat echo planned 10/2024.  Coronary calcification on CT/history of CVA -Today he is stable with no anginal symptoms.  No indication for ischemic evaluation.  Continue atorvastatin  80 mg and clopidogrel  75 mg.  Hyperlipidemia, LDL goal<70- Most recent  LDL 70 10/10/2023.  Continue atorvastatin  80 mg. Heart healthy diet and regular cardiovascular exercise encouraged.   Hypertension -BP today 132/84. He reports not taking BP at home. BP has been stable in office. Continue amlodipine  5 mg and metoprolol  50 mg. Discussed to monitor BP at home at least 2 hours after medications and sitting for 5-10 minutes. Will readdress BP with home readings.   Aortic root dilation/ascending aortic aneurysm - 09/2018 4 aortic root 33 mm and a ascending aorta 44 mm -> 09/2018 5 aortic root 43 mm and ascending aorta 46 mm.  CT aorta 09/2024 ordered.  Continue control BP with amlodipine  5 mg and metoprolol  succinate 50 mg.  Former Tobacco use- Noted bilateral upper lobe wheezing today. He is afebrile and no signs of infection or SOB noted. Previously prescribed Incruse Ellipta , but was unable to get d/t cost. Will discuss with PCP.  Dispo: Follow up with Dr. Raford or Reche Finder NP in 6 months.   Signed, Mardy KATHEE Pizza, FNP  "

## 2024-04-23 ENCOUNTER — Other Ambulatory Visit (HOSPITAL_BASED_OUTPATIENT_CLINIC_OR_DEPARTMENT_OTHER): Payer: Self-pay | Admitting: Family

## 2024-04-23 DIAGNOSIS — E785 Hyperlipidemia, unspecified: Secondary | ICD-10-CM

## 2024-04-23 DIAGNOSIS — I25118 Atherosclerotic heart disease of native coronary artery with other forms of angina pectoris: Secondary | ICD-10-CM

## 2024-09-12 ENCOUNTER — Other Ambulatory Visit (HOSPITAL_BASED_OUTPATIENT_CLINIC_OR_DEPARTMENT_OTHER)

## 2024-10-17 ENCOUNTER — Ambulatory Visit: Admitting: Neurology
